# Patient Record
Sex: Female | Born: 1975 | Race: White | Hispanic: No | Marital: Single | Smoking: Current every day smoker
Health system: Southern US, Community
[De-identification: ages and names within clinical notes are randomized; demographics above are authoritative.]

## PROBLEM LIST (undated history)

## (undated) DIAGNOSIS — D510 Vitamin B12 deficiency anemia due to intrinsic factor deficiency: Secondary | ICD-10-CM

## (undated) DIAGNOSIS — D649 Anemia, unspecified: Secondary | ICD-10-CM

## (undated) DIAGNOSIS — F419 Anxiety disorder, unspecified: Secondary | ICD-10-CM

## (undated) DIAGNOSIS — N189 Chronic kidney disease, unspecified: Secondary | ICD-10-CM

## (undated) DIAGNOSIS — M1990 Unspecified osteoarthritis, unspecified site: Secondary | ICD-10-CM

## (undated) DIAGNOSIS — G43909 Migraine, unspecified, not intractable, without status migrainosus: Secondary | ICD-10-CM

## (undated) HISTORY — PX: APPENDECTOMY: SHX54

## (undated) HISTORY — DX: Anxiety disorder, unspecified: F41.9

## (undated) HISTORY — DX: Anemia, unspecified: D64.9

## (undated) HISTORY — PX: TONSILLECTOMY: SUR1361

## (undated) HISTORY — PX: ECTOPIC PREGNANCY SURGERY: SHX613

## (undated) HISTORY — DX: Migraine, unspecified, not intractable, without status migrainosus: G43.909

## (undated) HISTORY — DX: Vitamin B12 deficiency anemia due to intrinsic factor deficiency: D51.0

---

## 2012-04-10 HISTORY — PX: SHOULDER ARTHROSCOPY: SHX128

## 2013-04-10 HISTORY — PX: CERVICAL FUSION: SHX112

## 2013-12-05 ENCOUNTER — Ambulatory Visit: Payer: Self-pay | Admitting: Physician Assistant

## 2014-08-11 LAB — TSH: TSH: 0.8 u[IU]/mL (ref <=5.90)

## 2014-11-15 ENCOUNTER — Other Ambulatory Visit: Payer: Self-pay | Admitting: Internal Medicine

## 2014-11-15 ENCOUNTER — Encounter: Payer: Self-pay | Admitting: Internal Medicine

## 2014-11-15 DIAGNOSIS — Z3009 Encounter for other general counseling and advice on contraception: Secondary | ICD-10-CM

## 2014-11-15 DIAGNOSIS — K581 Irritable bowel syndrome with constipation: Secondary | ICD-10-CM | POA: Insufficient documentation

## 2014-11-15 DIAGNOSIS — D510 Vitamin B12 deficiency anemia due to intrinsic factor deficiency: Secondary | ICD-10-CM | POA: Insufficient documentation

## 2014-11-15 DIAGNOSIS — M810 Age-related osteoporosis without current pathological fracture: Secondary | ICD-10-CM | POA: Insufficient documentation

## 2014-11-15 DIAGNOSIS — E039 Hypothyroidism, unspecified: Secondary | ICD-10-CM | POA: Insufficient documentation

## 2014-11-15 HISTORY — DX: Vitamin B12 deficiency anemia due to intrinsic factor deficiency: D51.0

## 2014-11-15 HISTORY — DX: Encounter for other general counseling and advice on contraception: Z30.09

## 2014-11-20 ENCOUNTER — Encounter: Payer: Self-pay | Admitting: Family Medicine

## 2014-11-20 ENCOUNTER — Ambulatory Visit (INDEPENDENT_AMBULATORY_CARE_PROVIDER_SITE_OTHER): Payer: 59 | Admitting: Family Medicine

## 2014-11-20 VITALS — BP 100/64 | HR 80 | Ht 64.0 in | Wt 167.0 lb

## 2014-11-20 DIAGNOSIS — M7061 Trochanteric bursitis, right hip: Secondary | ICD-10-CM | POA: Diagnosis not present

## 2014-11-20 MED ORDER — ETODOLAC 500 MG PO TABS: 500.0000 mg | ORAL_TABLET | Freq: Two times a day (BID) | ORAL | Status: DC

## 2014-11-20 NOTE — Progress Notes (Signed)
Name: Theresa Murphy   MRN: [id number]    DOB: 1976/03/13   Date:11/20/2014       Progress Note  Subjective  Chief Complaint  Chief Complaint  Patient presents with  . Hip Pain    Hip Pain  There was no injury mechanism. The pain is present in the right hip. The quality of the pain is described as aching. The pain is at a severity of 8/10. The pain is moderate. The pain has been intermittent since onset. Associated symptoms include a loss of sensation and muscle weakness. Pertinent negatives include no inability to bear weight, loss of motion, numbness or tingling. The symptoms are aggravated by palpation, weight bearing and movement. She has tried non-weight bearing and ice for the symptoms. The treatment provided no relief.    No problem-specific assessment & plan notes found for this encounter.   History reviewed. No pertinent past medical history.  Past Surgical History  Procedure Laterality Date  . Cervical fusion  2015  . Shoulder arthroscopy Right 2014  . Ectopic pregnancy surgery      x 2    Family History  Problem Relation Age of Onset  . Thyroid disease Mother   . Diabetes Father   . CAD Father     Social History   Social History  . Marital Status: Single    Spouse Name: N/A  . Number of Children: N/A  . Years of Education: N/A   Occupational History  . Not on file.   Social History Main Topics  . Smoking status: Former Smoker    Quit date: 04/10/2004  . Smokeless tobacco: Not on file  . Alcohol Use: 1.2 oz/week    2 Standard drinks or equivalent per week  . Drug Use: No  . Sexual Activity: Yes   Other Topics Concern  . Not on file   Social History Narrative    Allergies  Allergen Reactions  . Codeine      Review of Systems  Constitutional: Negative for fever, chills, weight loss and malaise/fatigue.  HENT: Negative for ear discharge, ear pain and sore throat.   Eyes: Negative for blurred vision.  Respiratory: Negative for cough,  sputum production, shortness of breath and wheezing.   Cardiovascular: Negative for chest pain, palpitations and leg swelling.  Gastrointestinal: Negative for heartburn, nausea, abdominal pain, diarrhea, constipation, blood in stool and melena.  Genitourinary: Negative for dysuria, urgency, frequency and hematuria.  Musculoskeletal: Positive for myalgias. Negative for back pain, joint pain and neck pain.  Skin: Negative for rash.  Neurological: Negative for dizziness, tingling, sensory change, focal weakness, numbness and headaches.  Endo/Heme/Allergies: Negative for environmental allergies and polydipsia. Does not bruise/bleed easily.  Psychiatric/Behavioral: Negative for depression and suicidal ideas. The patient is not nervous/anxious and does not have insomnia.      Objective  Filed Vitals:   11/20/14 1543  BP: 100/64  Pulse: 80  Height:  (1.626 m)  Weight: 167 lb (75.751 kg)    Physical Exam  Constitutional: She is well-developed, well-nourished, and in no distress. No distress.  HENT:  Head: Normocephalic and atraumatic.  Right Ear: External ear normal.  Left Ear: External ear normal.  Nose: Nose normal.  Mouth/Throat: Oropharynx is clear and moist.  Eyes: Conjunctivae and EOM are normal. Pupils are equal, round, and reactive to light. Right eye exhibits no discharge. Left eye exhibits no discharge.  Neck: Normal range of motion. Neck supple. No JVD present. No thyromegaly present.  Cardiovascular: Normal rate,  regular rhythm, normal heart sounds and intact distal pulses.  Exam reveals no gallop and no friction rub.   No murmur heard. Pulmonary/Chest: Effort normal and breath sounds normal.  Abdominal: Soft. Bowel sounds are normal. She exhibits no mass. There is no tenderness. There is no guarding.  Musculoskeletal: Normal range of motion. She exhibits tenderness. She exhibits no edema.  Right trochanteric bursa  Lymphadenopathy:    She has no cervical adenopathy.   Neurological: She is alert. She has normal sensation, normal strength and normal reflexes.  Skin: Skin is warm and dry. She is not diaphoretic. No erythema.  Psychiatric: Mood and affect normal.      Assessment & Plan  Problem List Items Addressed This Visit    None    Visit Diagnoses    Trochanteric bursitis of right hip    -  Primary    Relevant Medications    etodolac (LODINE) 500 MG tablet    Other Relevant Orders    Ambulatory referral to Orthopedic Surgery         Dr. Elizabeth Sauer [facility]  11/20/2014

## 2014-11-21 ENCOUNTER — Other Ambulatory Visit: Payer: Self-pay | Admitting: Internal Medicine

## 2014-11-21 DIAGNOSIS — M7061 Trochanteric bursitis, right hip: Secondary | ICD-10-CM

## 2014-11-21 MED ORDER — ETODOLAC 500 MG PO TABS: 500.0000 mg | ORAL_TABLET | Freq: Two times a day (BID) | ORAL | Status: DC

## 2015-01-08 ENCOUNTER — Ambulatory Visit (INDEPENDENT_AMBULATORY_CARE_PROVIDER_SITE_OTHER): Payer: 59 | Admitting: Internal Medicine

## 2015-01-08 ENCOUNTER — Encounter: Payer: Self-pay | Admitting: Internal Medicine

## 2015-01-08 VITALS — BP 120/64 | HR 96 | Ht 64.0 in | Wt 154.6 lb

## 2015-01-08 DIAGNOSIS — G8918 Other acute postprocedural pain: Secondary | ICD-10-CM | POA: Diagnosis not present

## 2015-01-08 DIAGNOSIS — G43009 Migraine without aura, not intractable, without status migrainosus: Secondary | ICD-10-CM

## 2015-01-08 DIAGNOSIS — F411 Generalized anxiety disorder: Secondary | ICD-10-CM | POA: Diagnosis not present

## 2015-01-08 DIAGNOSIS — G43109 Migraine with aura, not intractable, without status migrainosus: Secondary | ICD-10-CM | POA: Insufficient documentation

## 2015-01-08 MED ORDER — FROVATRIPTAN SUCCINATE 2.5 MG PO TABS: 2.5000 mg | ORAL_TABLET | ORAL | Status: DC | PRN

## 2015-01-08 MED ORDER — CLONAZEPAM 0.5 MG PO TABS: 0.5000 mg | ORAL_TABLET | Freq: Every day | ORAL | Status: DC | PRN

## 2015-01-08 MED ORDER — DULOXETINE HCL 60 MG PO CPEP: 60.0000 mg | ORAL_CAPSULE | Freq: Every day | ORAL | Status: DC

## 2015-01-08 NOTE — Progress Notes (Signed)
Date:  01/08/2015   Name:  Theresa Murphy   DOB:  10-19-1975   MRN:  [id number]   Chief Complaint: Anxiety and Migraine Anxiety Presents for follow-up visit. Onset was 1 to 6 months ago (several times per week). Symptoms include chest pain, nausea, nervous/anxious behavior, palpitations and panic. Patient reports no decreased concentration, depressed mood, dizziness, irritability, shortness of breath or suicidal ideas. Symptoms occur occasionally. The most recent episode lasted 2 hours. The severity of symptoms is moderate.   Risk factors include recent illness (Anxiety and panic attacks are worse since her neck surgery. ). Past treatments include SSRIs (She's been on a number of SSRIs in the past.).  Migraine  This is a recurrent problem. The current episode started more than 1 year ago. The problem occurs intermittently (twice a month). The pain is located in the right unilateral, frontal and retro-orbital region. The pain quality is similar to prior headaches. Associated symptoms include nausea, neck pain, phonophobia and photophobia. Pertinent negatives include no dizziness, fever or seizures.   The patient reports a history of migraines starting in the teenager. They got better for a number of years. However since her cervical fusion last year they've been increasing. She has a migraine about twice per month. Currently taking only ibuprofen or aspirin. Previously she took Frova with good results.  She's been having more chronic neck pain since her fusion. Tabetic surgery cannot find anything to help her so she's been referred to the pain clinic. She's not had her first appointment yet that next month.  Review of Systems:  Review of Systems  Constitutional: Negative for fever, irritability and fatigue.  Eyes: Positive for photophobia.  Respiratory: Negative for choking and shortness of breath.   Cardiovascular: Positive for chest pain and palpitations.  Gastrointestinal: Positive for  nausea.  Musculoskeletal: Positive for neck pain and neck stiffness.  Neurological: Positive for light-headedness and headaches. Negative for dizziness, seizures and syncope.  Psychiatric/Behavioral: Negative for suicidal ideas, dysphoric mood and decreased concentration. The patient is nervous/anxious.     Patient Active Problem List   Diagnosis Date Noted  . Acquired hypothyroidism 11/15/2014  . Irritable bowel syndrome with constipation 11/15/2014  . OP (osteoporosis) 11/15/2014  . 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 Family planning 11/15/2014    Prior to Admission medications   Medication Sig Start Date End Date Taking? Authorizing Provider  cyanocobalamin (,VITAMIN B-12,) 1000 MCG/ML injection CYANOCOBALAMIN, 1000MCG/ML (Injection Solution)  1 (one) Milliliter monthly for 300 days  Quantity: 10;  Refills: 0   Ordered :11-Aug-2014  Bari Edward M.D.;  Started 11-Aug-2014 Active 08/11/14  Yes Historical Provider, MD  Linaclotide Karlene Einstein) 145 MCG CAPS capsule Take 1 capsule by mouth daily. 09/02/14  Yes Historical Provider, MD  norethindrone-ethinyl estradiol (MICROGESTIN FE 1/20) 1-20 MG-MCG tablet Take 1 tablet by mouth daily. 08/19/14  Yes Historical Provider, MD    Allergies  Allergen Reactions  . Codeine     Past Surgical History  Procedure Laterality Date  . Cervical fusion  2015  . Shoulder arthroscopy Right 2014  . Ectopic pregnancy surgery      x 2    Social History  Substance Use Topics  . Smoking status: Former Smoker    Quit date: 04/10/2004  . Smokeless tobacco: None  . Alcohol Use: 1.2 oz/week    2 Standard drinks or equivalent per week     Medication list has been reviewed and updated.  Physical Examination:  Physical Exam  Constitutional: She is oriented to person,  place, and time. She appears well-developed and well-nourished. No distress.  HENT:  Head: Normocephalic and atraumatic.  Eyes: Conjunctivae and EOM are normal. Pupils are equal, round,  and reactive to light. Right eye exhibits no discharge. Left eye exhibits no discharge. No scleral icterus.  Neck: Muscular tenderness present. No rigidity.    Cardiovascular: Normal rate, regular rhythm and normal heart sounds.   Pulmonary/Chest: Effort normal and breath sounds normal. No respiratory distress.  Musculoskeletal: Normal range of motion.  Lymphadenopathy:    She has no cervical adenopathy.  Neurological: She is alert and oriented to person, place, and time. She has normal strength.  Skin: Skin is warm and dry. No rash noted.  Psychiatric: Her speech is normal and behavior is normal. Thought content normal. Her mood appears anxious.    BP 120/64 mmHg  Pulse 96  Ht  (1.626 m)  Wt 154 lb 9.6 oz (70.126 kg)  BMI 26.52 kg/m2  Assessment and Plan: 1. Generalized anxiety disorder Begin Cymbalta for anxiety as well as pain control Patient may take clonazepam when necessary for panic attack - DULoxetine (CYMBALTA) 60 MG capsule; Take 1 capsule (60 mg total) by mouth daily.  Dispense: 30 capsule; Refill: 3 - clonazePAM (KLONOPIN) 0.5 MG tablet; Take 1 tablet (0.5 mg total) by mouth daily as needed for anxiety.  Dispense: 30 tablet; Refill: 0  2. Migraine headache Begin Frova as needed - frovatriptan (FROVA) 2.5 MG tablet; Take 1 tablet (2.5 mg total) by mouth as needed for migraine. If recurs, may repeat after 2 hours. Max of 3 tabs in 24 hours.  Dispense: 10 tablet; Refill: 5  3. Worsening of neck pain following surgery May get some benefit from duloxetine Follow-up with pain clinic as planned  Bari Edward, MD [facility]  01/08/2015

## 2015-01-25 ENCOUNTER — Emergency Department: Payer: 59

## 2015-01-25 ENCOUNTER — Emergency Department
Admission: EM | Admit: 2015-01-25 | Discharge: 2015-01-25 | Disposition: A | Discharge status: Home / Self Care | Payer: 59 | Attending: Emergency Medicine | Admitting: Emergency Medicine

## 2015-01-25 ENCOUNTER — Encounter: Payer: Self-pay | Admitting: Emergency Medicine

## 2015-01-25 DIAGNOSIS — Z87891 Personal history of nicotine dependence: Secondary | ICD-10-CM | POA: Diagnosis not present

## 2015-01-25 DIAGNOSIS — W1839XA Other fall on same level, initial encounter: Secondary | ICD-10-CM | POA: Diagnosis not present

## 2015-01-25 DIAGNOSIS — M5412 Radiculopathy, cervical region: Secondary | ICD-10-CM | POA: Diagnosis not present

## 2015-01-25 DIAGNOSIS — S299XXA Unspecified injury of thorax, initial encounter: Secondary | ICD-10-CM | POA: Diagnosis present

## 2015-01-25 DIAGNOSIS — Y9389 Activity, other specified: Secondary | ICD-10-CM | POA: Diagnosis not present

## 2015-01-25 DIAGNOSIS — Y9289 Other specified places as the place of occurrence of the external cause: Secondary | ICD-10-CM | POA: Diagnosis not present

## 2015-01-25 DIAGNOSIS — Z79899 Other long term (current) drug therapy: Secondary | ICD-10-CM | POA: Diagnosis not present

## 2015-01-25 DIAGNOSIS — Y998 Other external cause status: Secondary | ICD-10-CM | POA: Diagnosis not present

## 2015-01-25 MED ORDER — KETOROLAC TROMETHAMINE 30 MG/ML IJ SOLN
60.0000 mg | Freq: Once | INTRAMUSCULAR | Status: AC
  Administered 2015-01-25: 60 mg via INTRAMUSCULAR
  Filled 2015-01-25: qty 2

## 2015-01-25 MED ORDER — OXYCODONE-ACETAMINOPHEN 5-325 MG PO TABS
1.0000 | ORAL_TABLET | Freq: Once | ORAL | Status: AC
  Administered 2015-01-25: 1 via ORAL
  Filled 2015-01-25: qty 1

## 2015-01-25 MED ORDER — MELOXICAM 15 MG PO TABS: 15.0000 mg | ORAL_TABLET | Freq: Every day | ORAL | Status: DC

## 2015-01-25 MED ORDER — OXYCODONE-ACETAMINOPHEN 5-325 MG PO TABS: 1.0000 | ORAL_TABLET | Freq: Four times a day (QID) | ORAL | Status: DC | PRN

## 2015-01-25 NOTE — ED Provider Notes (Signed)
[facility] Emergency Department Provider Note  ____________________________________________  Time seen: Approximately 7:45 PM  I have reviewed the triage vital signs and the nursing notes.   HISTORY  Chief Complaint Back Pain    HPI Theresa Murphy is a 39 y.o. female who presents to emergency department complaining of neck and back pain status post fall. She states that she fell 4 days prior and the pain has been also bearable since then. She states that she has a history of thoracic spine fractures as well as a cervical spine fusion. She hurts midline and that occasionally pain will shoot to both shoulders. The pain is severe baseline and almost unbearable with extreme movement. He said the pain that radiates to her shoulders is a "fiery" sensation. She used over-the-counter medications with no relief. Seen in urgent care and x-rays were performed however due to the poor quality they were unable to make any diagnosis or clearance of fracture. She denies any numbness or tingling to lower extremities. She denies any numbness or tingling in her hands.   History reviewed. No pertinent past medical history.  Patient Active Problem List   Diagnosis Date Noted  . Generalized anxiety disorder 01/08/2015  . 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 Worsening of neck pain following surgery 01/08/2015  . Acquired hypothyroidism 11/15/2014  . Irritable bowel syndrome with constipation 11/15/2014  . OP (osteoporosis) 11/15/2014  . 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 Family planning 11/15/2014    Past Surgical History  Procedure Laterality Date  . Cervical fusion  2015  . Shoulder arthroscopy Right 2014  . Ectopic pregnancy surgery      x 2    Current Outpatient Rx  Name  Route  Sig  Dispense  Refill  . clonazePAM (KLONOPIN) 0.5 MG tablet   Oral   Take 1 tablet (0.5 mg total) by mouth daily as needed for anxiety.   30 tablet   0   . cyanocobalamin (,VITAMIN  B-12,) 1000 MCG/ML injection      CYANOCOBALAMIN, 1000MCG/ML (Injection Solution)  1 (one) Milliliter monthly for 300 days  Quantity: 10;  Refills: 0   Ordered :11-Aug-2014  Bari Edward M.D.;  Started 11-Aug-2014 Active         . DULoxetine (CYMBALTA) 60 MG capsule   Oral   Take 1 capsule (60 mg total) by mouth daily.   30 capsule   3   . frovatriptan (FROVA) 2.5 MG tablet   Oral   Take 1 tablet (2.5 mg total) by mouth as needed for migraine. If recurs, may repeat after 2 hours. Max of 3 tabs in 24 hours.   10 tablet   5   . Linaclotide (LINZESS) 145 MCG CAPS capsule   Oral   Take 1 capsule by mouth daily.         . norethindrone-ethinyl estradiol (MICROGESTIN FE 1/20) 1-20 MG-MCG tablet   Oral   Take 1 tablet by mouth daily.           Allergies Codeine  Family History  Problem Relation Age of Onset  . Thyroid disease Mother   . Diabetes Father   . CAD Father     Social History Social History  Substance Use Topics  . Smoking status: Former Smoker    Quit date: 04/10/2004  . Smokeless tobacco: None  . Alcohol Use: 1.2 oz/week    2 Standard drinks or equivalent per week    Review of Systems Constitutional: No fever/chills Eyes: No visual  changes. ENT: No sore throat. Cardiovascular: Denies chest pain. Respiratory: Denies shortness of breath. Gastrointestinal: No abdominal pain.  No nausea, no vomiting.  No diarrhea.  No constipation. Genitourinary: Negative for dysuria. Musculoskeletal: Endorses back and neck pain. Skin: Negative for rash. Neurological: Negative for headaches, focal weakness or numbness.  10-point ROS otherwise negative.  ____________________________________________   PHYSICAL EXAM:  VITAL SIGNS: ED Triage Vitals  Enc Vitals Group     BP 01/25/15 1839 126/57 mmHg     Pulse Rate 01/25/15 1839 91     Resp 01/25/15 1839 18     Temp 01/25/15 1839 97 F (36.1 C)     Temp Source 01/25/15 1839 Oral     SpO2 01/25/15 1839  100 %     Weight 01/25/15 1839 152 lb (68.947 kg)     Height 01/25/15 1839 5\' 4"  (1.626 m)     Head Cir --      Peak Flow --      Pain Score 01/25/15 1840 10     Pain Loc --      Pain Edu? --      Excl. in GC? --     Constitutional: Alert and oriented. Well appearing and in no acute distress. Eyes: Conjunctivae are normal. PERRL. EOMI. Head: Atraumatic. Nose: No congestion/rhinnorhea. Mouth/Throat: Mucous membranes are moist.  Oropharynx non-erythematous. Neck: No stridor.  Tenderness to palpation over C5-C7 spinal processes. No tenderness to palpation over paraspinal muscles. Cardiovascular: Normal rate, regular rhythm. Grossly normal heart sounds.  Good peripheral circulation. Respiratory: Normal respiratory effort.  No retractions. Lungs CTAB. Gastrointestinal: Soft and nontender. No distention. No abdominal bruits. No CVA tenderness. Musculoskeletal: No lower extremity tenderness nor edema.  No joint effusions. Tenderness to palpation or spinal processes in the T1-T4 range. No deformities palpated. No tenderness to palpation over paraspinal muscles. No muscle spasms noted. Neurologic:  Normal speech and language. No gross focal neurologic deficits are appreciated. No gait instability. Skin:  Skin is warm, dry and intact. No rash noted. Psychiatric: Mood and affect are normal. Speech and behavior are normal.  ____________________________________________   LABS (all labs ordered are listed, but only abnormal results are displayed)  Labs Reviewed - No data to display ____________________________________________  EKG   ____________________________________________  RADIOLOGY  CT C-spine Impression: No acute traumatic injury within the cervical spine. Status post ACDF at C4-C6 without, location. __________________________   PROCEDURES  Procedure(s) performed: None  Critical Care performed: No  ____________________________________________   INITIAL IMPRESSION /  ASSESSMENT AND PLAN / ED COURSE  Pertinent labs & imaging results that were available during my care of the patient were reviewed by me and considered in my medical decision making (see chart for details).  The patient's history, symptoms, and physical exam are consistent with radiculopathy to the cervical region status post fall. Patient imaging reveals no acute fractures. I advised the patient of findings and diagnosis. The patient verbalizes understanding of diagnosis. I advised the patient to place her on anti-inflammatories for additional symptomatic relief. Patient is to follow-up with orthopedics should symptoms persist past treatment course. ____________________________________________   FINAL CLINICAL IMPRESSION(S) / ED DIAGNOSES  Final diagnoses:  Cervical radiculopathy      Racheal PatchesJonathan D Lisia Westbay, PA-C 01/25/15 2114  Myrna Blazeravid Matthew Schaevitz, MD 01/25/15 [phone]

## 2015-01-25 NOTE — ED Notes (Addendum)
Fell last Thursday.  Has history of back fractures in the past.  Since fall last Thursday, c/o severe pain mid scapular.  Has been taking Advil, no relief of pain. c/o shortness of breath from the pain since Friday.

## 2015-01-25 NOTE — Discharge Instructions (Signed)

## 2015-02-02 ENCOUNTER — Ambulatory Visit (INDEPENDENT_AMBULATORY_CARE_PROVIDER_SITE_OTHER): Payer: 59 | Admitting: Internal Medicine

## 2015-02-02 ENCOUNTER — Other Ambulatory Visit: Payer: Self-pay | Admitting: Internal Medicine

## 2015-02-02 ENCOUNTER — Encounter: Payer: Self-pay | Admitting: Internal Medicine

## 2015-02-02 VITALS — BP 100/58 | HR 84 | Ht 64.0 in | Wt 160.2 lb

## 2015-02-02 DIAGNOSIS — Z1239 Encounter for other screening for malignant neoplasm of breast: Secondary | ICD-10-CM

## 2015-02-02 DIAGNOSIS — Z0000 Encounter for general adult medical examination without abnormal findings: Secondary | ICD-10-CM

## 2015-02-02 DIAGNOSIS — E039 Hypothyroidism, unspecified: Secondary | ICD-10-CM

## 2015-02-02 DIAGNOSIS — K581 Irritable bowel syndrome with constipation: Secondary | ICD-10-CM | POA: Diagnosis not present

## 2015-02-02 DIAGNOSIS — G43109 Migraine with aura, not intractable, without status migrainosus: Secondary | ICD-10-CM

## 2015-02-02 DIAGNOSIS — Z124 Encounter for screening for malignant neoplasm of cervix: Secondary | ICD-10-CM | POA: Diagnosis not present

## 2015-02-02 DIAGNOSIS — G8918 Other acute postprocedural pain: Secondary | ICD-10-CM | POA: Diagnosis not present

## 2015-02-02 LAB — POC URINALYSIS WITH MICROSCOPIC (NON AUTO)MANUAL RESULT
BILIRUBIN UA: NEGATIVE
CRYSTALS: 0
EPITHELIAL CELLS, URINE PER MICROSCOPY: 10
GLUCOSE UA: NEGATIVE
Ketones, UA: NEGATIVE
Mucus, UA: 0
Nitrite, UA: NEGATIVE
Protein, UA: NEGATIVE
RBC: 1 M/uL — AB (ref 4.04–5.48)
Spec Grav, UA: 1.02
UROBILINOGEN UA: 0.2
WBC Casts, UA: 2
pH, UA: 5

## 2015-02-02 NOTE — Patient Instructions (Signed)
Breast Self-Awareness Practicing breast self-awareness may pick up problems early, prevent significant medical complications, and possibly save your life. By practicing breast self-awareness, you can become familiar with how your breasts look and feel and if your breasts are changing. This allows you to notice changes early. It can also offer you some reassurance that your breast health is good. One way to learn what is normal for your breasts and whether your breasts are changing is to do a breast self-exam. If you find a lump or something that was not present in the past, it is best to contact your caregiver right away. Other findings that should be evaluated by your caregiver include nipple discharge, especially if it is bloody; skin changes or reddening; areas where the skin seems to be pulled in (retracted); or new lumps and bumps. Breast pain is seldom associated with cancer (malignancy), but should also be evaluated by a caregiver. HOW TO PERFORM A BREAST SELF-EXAM The best time to examine your breasts is 5-7 days after your menstrual period is over. During menstruation, the breasts are lumpier, and it may be more difficult to pick up changes. If you do not menstruate, have reached menopause, or had your uterus removed (hysterectomy), you should examine your breasts at regular intervals, such as monthly. If you are breastfeeding, examine your breasts after a feeding or after using a breast pump. Breast implants do not decrease the risk for lumps or tumors, so continue to perform breast self-exams as recommended. Talk to your caregiver about how to determine the difference between the implant and breast tissue. Also, talk about the amount of pressure you should use during the exam. Over time, you will become more familiar with the variations of your breasts and more comfortable with the exam. A breast self-exam requires you to remove all your clothes above the waist. 1. Look at your breasts and nipples.  Stand in front of a mirror in a room with good lighting. With your hands on your hips, push your hands firmly downward. Look for a difference in shape, contour, and size from one breast to the other (asymmetry). Asymmetry includes puckers, dips, or bumps. Also, look for skin changes, such as reddened or scaly areas on the breasts. Look for nipple changes, such as discharge, dimpling, repositioning, or redness. 2. Carefully feel your breasts. This is best done either in the shower or tub while using soapy water or when flat on your back. Place the arm (on the side of the breast you are examining) above your head. Use the pads (not the fingertips) of your three middle fingers on your opposite hand to feel your breasts. Start in the underarm area and use  inch (2 cm) overlapping circles to feel your breast. Use 3 different levels of pressure (light, medium, and firm pressure) at each circle before moving to the next circle. The light pressure is needed to feel the tissue closest to the skin. The medium pressure will help to feel breast tissue a little deeper, while the firm pressure is needed to feel the tissue close to the ribs. Continue the overlapping circles, moving downward over the breast until you feel your ribs below your breast. Then, move one finger-width towards the center of the body. Continue to use the  inch (2 cm) overlapping circles to feel your breast as you move slowly up toward the collar bone (clavicle) near the base of the neck. Continue the up and down exam using all 3 pressures until you reach the   middle of the chest. Do this with each breast, carefully feeling for lumps or changes. 3.  Keep a written record with breast changes or normal findings for each breast. By writing this information down, you do not need to depend only on memory for size, tenderness, or location. Write down where you are in your menstrual cycle, if you are still menstruating. Breast tissue can have some lumps or  thick tissue. However, see your caregiver if you find anything that concerns you.  SEEK MEDICAL CARE IF:  You see a change in shape, contour, or size of your breasts or nipples.   You see skin changes, such as reddened or scaly areas on the breasts or nipples.   You have an unusual discharge from your nipples.   You feel a new lump or unusually thick areas.    This information is not intended to replace advice given to you by your health care provider. Make sure you discuss any questions you have with your health care provider.   Document Released: 03/27/2005 Document Revised: 03/13/2012 Document Reviewed: 07/12/2011 Elsevier Interactive Patient Education 2016 [employer]  

## 2015-02-02 NOTE — Progress Notes (Signed)
Date:  02/02/2015   Name:  Theresa Murphy   DOB:  08-06-1975   MRN:  [id number]   Chief Complaint: Annual Exam Theresa Murphy is a 39 y.o. female who presents today for her Complete Annual Exam. She feels fairly well. She reports exercising walking and using elliptical. She reports she is sleeping poorly. No breast symptoms and no hx of mammogram.  Menses are regular on OCP.  Last Pap was normal 2 years ago. Neck pain - Patient has chronic neck pain from a previous cervical fusion. She is waiting on a pain clinic appointment that will be upcoming in the next week. Several weeks ago she fell jarring her neck and back and was seen in the emergency room. CT scan was done which showed intact hardware no fracture. She was prescribed some Percocet and meloxicam 15 mg. She says it's only helping her pain minimally.  Migraine headache -patient has intermittent migraine headaches. Last visit she was given Cymbalta that might help with both headache and chronic pain but she was unable to tolerate that. It made her extremely nauseated.  Review of Systems  Constitutional: Positive for fatigue. Negative for fever and unexpected weight change.  HENT: Negative for hearing loss.   Eyes: Negative for visual disturbance.  Respiratory: Negative for cough, chest tightness and shortness of breath.   Cardiovascular: Negative for chest pain, palpitations and leg swelling.  Gastrointestinal: Negative for nausea and diarrhea.  Genitourinary: Negative for dysuria, hematuria, vaginal discharge (no concern regarding STD), menstrual problem and pelvic pain.  Musculoskeletal: Positive for back pain, neck pain and neck stiffness.  Skin: Negative for color change and rash.  Neurological: Positive for headaches. Negative for seizures, light-headedness and numbness.  Hematological: Negative for adenopathy. Does not bruise/bleed easily.  Psychiatric/Behavioral: Positive for sleep disturbance and dysphoric mood. Negative for  agitation.    Patient Active Problem List   Diagnosis Date Noted  . Generalized anxiety disorder 01/08/2015  . 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 Worsening of neck pain following surgery 01/08/2015  . Acquired hypothyroidism 11/15/2014  . Irritable bowel syndrome with constipation 11/15/2014  . OP (osteoporosis) 11/15/2014  . 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 Family planning 11/15/2014    Prior to Admission medications   Medication Sig Start Date End Date Taking? Authorizing Provider  clonazePAM (KLONOPIN) 0.5 MG tablet Take 1 tablet (0.5 mg total) by mouth daily as needed for anxiety. 01/08/15  Yes Reubin Milan, MD  cyanocobalamin (,VITAMIN B-12,) 1000 MCG/ML injection CYANOCOBALAMIN, 1000MCG/ML (Injection Solution)  1 (one) Milliliter monthly for 300 days  Quantity: 10;  Refills: 0   Ordered :11-Aug-2014  Bari Edward M.D.;  Started 11-Aug-2014 Active 08/11/14  Yes Historical Provider, MD  frovatriptan (FROVA) 2.5 MG tablet Take 1 tablet (2.5 mg total) by mouth as needed for migraine. If recurs, may repeat after 2 hours. Max of 3 tabs in 24 hours. 01/08/15  Yes Reubin Milan, MD  gabapentin (NEURONTIN) 100 MG capsule Take 3 capsules by mouth 3 (three) times daily. 01/08/15  Yes Historical Provider, MD  Linaclotide Karlene Einstein) 145 MCG CAPS capsule Take 1 capsule by mouth daily. 09/02/14  Yes Historical Provider, MD  meloxicam (MOBIC) 15 MG tablet Take 1 tablet (15 mg total) by mouth daily. 01/25/15  Yes Delorise Royals Cuthriell, PA-C  norethindrone-ethinyl estradiol (MICROGESTIN FE 1/20) 1-20 MG-MCG tablet Take 1 tablet by mouth daily. 08/19/14  Yes Historical Provider, MD  DULoxetine (CYMBALTA) 60 MG capsule Take 1 capsule (60 mg total) by mouth daily.  Patient not taking: Reported on 02/02/2015 01/08/15   Reubin MilanLaura H Estrella Alcaraz, MD    Allergies  Allergen Reactions  . Codeine   . Cymbalta [Duloxetine Hcl] Nausea Only    Past Surgical History  Procedure Laterality Date  . Cervical  fusion  2015  . Shoulder arthroscopy Right 2014  . Ectopic pregnancy surgery      x 2    Social History  Substance Use Topics  . Smoking status: Former Smoker    Quit date: 04/10/2004  . Smokeless tobacco: None  . Alcohol Use: 1.2 oz/week    2 Standard drinks or equivalent per week     Medication list has been reviewed and updated.   Physical Exam  Constitutional: She is oriented to person, place, and time. She appears well-developed and well-nourished. No distress.  HENT:  Head: Normocephalic and atraumatic.  Right Ear: Tympanic membrane and ear canal normal.  Left Ear: Tympanic membrane and ear canal normal.  Nose: Right sinus exhibits no maxillary sinus tenderness. Left sinus exhibits no maxillary sinus tenderness.  Mouth/Throat: Uvula is midline and oropharynx is clear and moist.  Eyes: Conjunctivae and EOM are normal. Pupils are equal, round, and reactive to light. Right eye exhibits no discharge. Left eye exhibits no discharge. No scleral icterus.  Neck: Muscular tenderness present. Carotid bruit is not present. Decreased range of motion present. No erythema present. No thyromegaly present.  Cardiovascular: Normal rate, regular rhythm, normal heart sounds, intact distal pulses and normal pulses.   Pulmonary/Chest: Effort normal and breath sounds normal. No respiratory distress. She has no wheezes. Right breast exhibits no mass, no nipple discharge, no skin change and no tenderness. Left breast exhibits no mass, no nipple discharge, no skin change and no tenderness.  Abdominal: Soft. Bowel sounds are normal. There is no hepatosplenomegaly. There is no tenderness. There is no CVA tenderness.  Genitourinary: Vagina normal and uterus normal. Pelvic exam was performed with patient prone. There is no rash or lesion on the right labia. There is no rash or lesion on the left labia. Cervix exhibits discharge (scant yellow mucus). Cervix exhibits no motion tenderness and no friability.  Right adnexum displays no mass, no tenderness and no fullness. Left adnexum displays no mass, no tenderness and no fullness.  Musculoskeletal:       Cervical back: She exhibits decreased range of motion and tenderness.  Lymphadenopathy:    She has no cervical adenopathy.    She has no axillary adenopathy.  Neurological: She is alert and oriented to person, place, and time. She has normal reflexes. No cranial nerve deficit or sensory deficit.  Skin: Skin is warm, dry and intact. No rash noted.  Psychiatric: She has a normal mood and affect. Her speech is normal and behavior is normal. Thought content normal.  Nursing note and vitals reviewed.   BP 100/58 mmHg  Pulse 84  Ht 5\' 4"  (1.626 m)  Wt 160 lb 3.2 oz (72.666 kg)  BMI 27.48 kg/m2  LMP 01/14/2015  Assessment and Plan: 1. Annual physical exam Urinalysis appears contaminated by vaginal discharge - POC urinalysis w microscopic (non auto)  2. Pap smear for cervical cancer screening - Pap IG and HPV (high risk) DNA detection  3. Acquired hypothyroidism recent TSH normal - not requiring supplementation  4. Migraine aura without headache Intermittent symptoms - continue Frova as needed  5. Irritable bowel syndrome with constipation Doing well on lens S - Comprehensive metabolic panel - CBC with Differential/Platelet  6. Breast cancer  screening Begin annual screening - MM DIGITAL SCREENING BILATERAL; Future  7. Worsening of neck pain following surgery Continue meloxicam, as needed oxycodone Appointment with pain clinic for further management  Bari Edward, MD [facility]  02/02/2015

## 2015-02-03 LAB — CBC WITH DIFFERENTIAL/PLATELET
Basophils Absolute: 0 10*3/uL (ref 0.0–0.2)
Basos: 0 %
EOS (ABSOLUTE): 0.1 10*3/uL (ref 0.0–0.4)
EOS: 1 %
HEMATOCRIT: 38.1 % (ref 34.0–46.6)
HEMOGLOBIN: 12.5 g/dL (ref 11.1–15.9)
IMMATURE GRANS (ABS): 0.1 10*3/uL (ref 0.0–0.1)
Immature Granulocytes: 1 %
LYMPHS ABS: 1.8 10*3/uL (ref 0.7–3.1)
LYMPHS: 21 %
MCH: 31.9 pg (ref 26.6–33.0)
MCHC: 32.8 g/dL (ref 31.5–35.7)
MCV: 97 fL (ref 79–97)
MONOCYTES: 7 %
Monocytes Absolute: 0.6 10*3/uL (ref 0.1–0.9)
Neutrophils Absolute: 6 10*3/uL (ref 1.4–7.0)
Neutrophils: 70 %
Platelets: 403 10*3/uL — ABNORMAL HIGH (ref 150–379)
RBC: 3.92 x10E6/uL (ref 3.77–5.28)
RDW: 13.6 % (ref 12.3–15.4)
WBC: 8.4 10*3/uL (ref 3.4–10.8)

## 2015-02-03 LAB — COMPREHENSIVE METABOLIC PANEL
ALBUMIN: 4 g/dL (ref 3.5–5.5)
ALK PHOS: 63 IU/L (ref 39–117)
ALT: 13 IU/L (ref 0–32)
AST: 12 IU/L (ref 0–40)
Albumin/Globulin Ratio: 1.7 (ref 1.1–2.5)
BUN / CREAT RATIO: 17 (ref 9–23)
BUN: 13 mg/dL (ref 6–24)
Bilirubin Total: 0.3 mg/dL (ref 0.0–1.2)
CO2: 22 mmol/L (ref 18–29)
CREATININE: 0.75 mg/dL (ref 0.57–1.00)
Calcium: 8.8 mg/dL (ref 8.7–10.2)
Chloride: 103 mmol/L (ref 97–106)
GFR calc non Af Amer: 100 mL/min/{1.73_m2} (ref >59)
GFR, EST AFRICAN AMERICAN: 115 mL/min/{1.73_m2} (ref >59)
GLUCOSE: 89 mg/dL (ref 65–99)
Globulin, Total: 2.4 g/dL (ref 1.5–4.5)
Potassium: 4.4 mmol/L (ref 3.5–5.2)
Sodium: 140 mmol/L (ref 136–144)
TOTAL PROTEIN: 6.4 g/dL (ref 6.0–8.5)

## 2015-02-04 LAB — PAP IG AND HPV HIGH-RISK: PAP Smear Comment: 0

## 2015-02-09 ENCOUNTER — Ambulatory Visit: Payer: 59 | Attending: Pain Medicine | Admitting: Pain Medicine

## 2015-02-09 ENCOUNTER — Telehealth: Payer: Self-pay

## 2015-02-09 ENCOUNTER — Encounter: Payer: Self-pay | Admitting: Pain Medicine

## 2015-02-09 VITALS — BP 107/58 | HR 89 | Temp 98.6°F | Resp 16 | Ht 64.0 in | Wt 150.0 lb

## 2015-02-09 DIAGNOSIS — Z981 Arthrodesis status: Secondary | ICD-10-CM | POA: Diagnosis not present

## 2015-02-09 DIAGNOSIS — M533 Sacrococcygeal disorders, not elsewhere classified: Secondary | ICD-10-CM | POA: Insufficient documentation

## 2015-02-09 DIAGNOSIS — M7060 Trochanteric bursitis, unspecified hip: Secondary | ICD-10-CM | POA: Diagnosis not present

## 2015-02-09 DIAGNOSIS — Q761 Klippel-Feil syndrome: Secondary | ICD-10-CM | POA: Insufficient documentation

## 2015-02-09 DIAGNOSIS — M79602 Pain in left arm: Secondary | ICD-10-CM | POA: Diagnosis present

## 2015-02-09 DIAGNOSIS — K589 Irritable bowel syndrome without diarrhea: Secondary | ICD-10-CM | POA: Diagnosis not present

## 2015-02-09 DIAGNOSIS — F418 Other specified anxiety disorders: Secondary | ICD-10-CM | POA: Diagnosis not present

## 2015-02-09 DIAGNOSIS — M5481 Occipital neuralgia: Secondary | ICD-10-CM | POA: Insufficient documentation

## 2015-02-09 DIAGNOSIS — M5030 Other cervical disc degeneration, unspecified cervical region: Secondary | ICD-10-CM | POA: Insufficient documentation

## 2015-02-09 DIAGNOSIS — M79601 Pain in right arm: Secondary | ICD-10-CM | POA: Diagnosis present

## 2015-02-09 DIAGNOSIS — M47812 Spondylosis without myelopathy or radiculopathy, cervical region: Secondary | ICD-10-CM | POA: Insufficient documentation

## 2015-02-09 DIAGNOSIS — M5134 Other intervertebral disc degeneration, thoracic region: Secondary | ICD-10-CM | POA: Insufficient documentation

## 2015-02-09 DIAGNOSIS — M542 Cervicalgia: Secondary | ICD-10-CM | POA: Diagnosis present

## 2015-02-09 DIAGNOSIS — M5136 Other intervertebral disc degeneration, lumbar region: Secondary | ICD-10-CM | POA: Insufficient documentation

## 2015-02-09 NOTE — Progress Notes (Signed)
Subjective:    Patient ID: Theresa Murphy, female    DOB: [birth year], 39 y.o.   MRN: [id number]  HPI Patient is 39 year old female who comes to pain management Center questionnaire Dr.Demmig Maisie Fushomas for further evaluation and treatment of pain involving the neck upper extremity region and headaches. The patient states that she is status post surgical intervention of the cervical region and states that she has had pain since her surgery of prostate 1 year ago. The patient states that she fell approximately 3 weeks ago when she tripped while getting out of the bed. Patient states the pain involves the upper back region and headaches in the upper extremity region on the right. Patient described the pain as aching unknowing constant cramping deep disabling nagging pressure like sensation. Patient stated the pain was sharp shooting stabbing tingling tiring uncomfortable. Patient stated that the pain interferes with ability to go to sleep and prevents her from going to sleep quite often. Patient admits to some weakness of the upper extremity as well as spasms and states that the pain is aggravated by twisting and sitting standing squatting stooping twisting and the surgery made the pain worse. Patient states the pain has decreased with cold packs hot packs lying down resting and medications. We discussed patient's condition and will consider patient for interventional treatment at time return appointment consisting of greater occipital nerve block and myoneural block injections. We informed patient that we will consider intraspinal injections and other treatment modifications pending response to the greater occipital nerve block and myoneural block injections. The patient admits to significant headache as well as significant muscle spasms of the cervical and thoracic region. We will proceed with what is felt to be medically necessary procedure in attempt to decrease severity of symptoms, minimize progression of  symptoms, and avoid the need for more involved treatment. The patient was with understanding and in agreement with suggested treatment plan.      Review of Systems   Cardiovascular Unremarkable  Pulmonary Unremarkable  Neurological Unremarkable  Psychological Anxiety Depression Panic attacks  Gastrointestinal Irritable bowel syndrome  Genitourinary Kidney stones Blood and urine Recurrent urinary tract infection  Hematological Unremarkable  Endocrine  Unremarkable  Musculoskeletal Unremarkable  Other significant Unremarkable         ObjecRheumatotive:   Physical Exam     There was moderate to moderately severe tenderness to palpation of the splenius capitis and occipitalis musculature region. Patient appeared to be with unremarkable Spurling's maneuver. Palpation of the acromioclavicular and glenohumeral joint region was a tends to palpation of moderate degree Patient appeared to be with decreased grip strength. Tinel and Phalen's maneuver were without increased pain of significant degree. There was tenderness over the cervical facet cervical paraspinal musculature region of moderate to moderately severe degree and tenderness over the thoracic facet thoracic paraspinal musculature region as well. No crepitus of the thoracic region was noted. Palpation over the levator scapula rhomboid musculature regions were revealed moderate to moderately severe tenderness to palpation and muscle spasms. Palpation over the lumbar paraspinal musculature region lumbar facet region was a tends to palpation of mild to moderate degree. Lateral bending and rotation extension and palpation of the lumbar facets reproduce mild to moderate discomfort. Palpation over the PSIS and PII S region reproduced moderate discomfort. There was mild tenderness of the greater trochanteric region and iliotibial band region. No definite sensory deficit of dermatomal distribution of the lower extremities  noted. Abdomen was nontender with no costovertebral angle tenderness noted  Assessment & Plan:   Bilateral occipital neuralgia   Degenerative disc disease of the cervical spine  Status post cervical fusion   Cervical facet syndrome   Sacroiliac joint dysfunction   Greater trochanteric bursitis      PLAN   Continue present medication Mobidin Klonopin and oxycodone acetaminophen. May consider Zanaflex and baclofen Cymbalta. We also discussed patient's use of Neurontin on today's visit  Greater occipital nerve block to be performed at time return appointment  F/U PCP Dr.Berglund  for evaliation of  BP and general medical  condition  F/U surgical evaluation. May consider pending follow-up evaluationsPatient is status post surgery of the cervical region by Dr. Rayetta Humphrey of [facility]  F/U neurological evaluation. May consider pending follow-up evaluations  May consider radiofrequency rhizolysis or intraspinal procedures pending response to present treatment and F/U evaluation   Patient to call Pain Management Center should patient have concerns prior to scheduled return appointment.

## 2015-02-09 NOTE — Patient Instructions (Addendum)
PLAN   PLAN   Continue present medications  Greater occipital nerve block to be performed at time return appointment  F/U PCP for evaliation of  BP and general medical  condition  F/U surgical evaluation. May consider pending follow-up evaluations  F/U neurological evaluation. May consider pending follow-up evaluations  May consider radiofrequency rhizolysis or intraspinal procedures pending response to present treatment and F/U evaluation   Patient to call [facility] should patient have concerns prior to scheduled return appointment.  General Discharge Instructions :  If you need to reach your doctor call: Monday-Friday 8:00 am - 4:00 pm at [phone] or toll free [phone].  After clinic hours [phone] to have operator reach doctor.  Bring all of your medication bottles to all your appointments in the pain clinic.  To cancel or reschedule your appointment with Pain Management please remember to call 24 hours in advance to avoid a fee.  Refer to the educational materials which you have been given on: General Risks, I had my Procedure. Discharge Instructions, Post Sedation.  Post Procedure Instructions:  The drugs you were given will stay in your system until tomorrow, so for the next 24 hours you should not drive, make any legal decisions or drink any alcoholic beverages.  You may eat anything you prefer, but it is better to start with liquids then soups and crackers, and gradually work up to solid foods.  Please notify your doctor immediately if you have any unusual bleeding, trouble breathing or pain that is not related to your normal pain.  Depending on the type of procedure that was done, some parts of your body may feel week and/or numb.  This usually clears up by tonight or the next day.  Walk with the use of an assistive device or accompanied by an adult for the 24 hours.  You may use ice on the affected area for the first 24 hours.  Put ice in  a Ziploc bag and cover with a towel and place against area 15 minutes on 15 minutes off.  You may switch to heat after 24 hours.GENERAL RISKS AND COMPLICATIONS  What are the risk, side effects and possible complications? Generally speaking, most procedures are safe.  However, with any procedure there are risks, side effects, and the possibility of complications.  The risks and complications are dependent upon the sites that are lesioned, or the type of nerve block to be performed.  The closer the procedure is to the spine, the more serious the risks are.  Great care is taken when placing the radio frequency needles, block needles or lesioning probes, but sometimes complications can occur. 1. Infection: Any time there is an injection through the skin, there is a risk of infection.  This is why sterile conditions are used for these blocks.  There are four possible types of infection. 1. Localized skin infection. 2. Central Nervous System Infection-This can be in the form of Meningitis, which can be deadly. 3. Epidural Infections-This can be in the form of an epidural abscess, which can cause pressure inside of the spine, causing compression of the spinal cord with subsequent paralysis. This would require an emergency surgery to decompress, and there are no guarantees that the patient would recover from the paralysis. 4. Discitis-This is an infection of the intervertebral discs.  It occurs in about 1% of discography procedures.  It is difficult to treat and it may lead to surgery.        2. Pain: the needles  have to go through skin and soft tissues, will cause soreness.       3. Damage to internal structures:  The nerves to be lesioned may be near blood vessels or    other nerves which can be potentially damaged.       4. Bleeding: Bleeding is more common if the patient is taking blood thinners such as  aspirin, Coumadin, Ticiid, Plavix, etc., or if he/she have some genetic predisposition  such as  hemophilia. Bleeding into the spinal canal can cause compression of the spinal  cord with subsequent paralysis.  This would require an emergency surgery to  decompress and there are no guarantees that the patient would recover from the  paralysis.       5. Pneumothorax:  Puncturing of a lung is a possibility, every time a needle is introduced in  the area of the chest or upper back.  Pneumothorax refers to free air around the  collapsed lung(s), inside of the thoracic cavity (chest cavity).  Another two possible  complications related to a similar event would include: Hemothorax and Chylothorax.   These are variations of the Pneumothorax, where instead of air around the collapsed  lung(s), you may have blood or chyle, respectively.       6. Spinal headaches: They may occur with any procedures in the area of the spine.       7. Persistent CSF (Cerebro-Spinal Fluid) leakage: This is a rare problem, but may occur  with prolonged intrathecal or epidural catheters either due to the formation of a fistulous  track or a dural tear.       8. Nerve damage: By working so close to the spinal cord, there is always a possibility of  nerve damage, which could be as serious as a permanent spinal cord injury with  paralysis.       9. Death:  Although rare, severe deadly allergic reactions known as "Anaphylactic  reaction" can occur to any of the medications used.      10. Worsening of the symptoms:  We can always make thing worse.  What are the chances of something like this happening? Chances of any of this occuring are extremely low.  By statistics, you have more of a chance of getting killed in a motor vehicle accident: while driving to the hospital than any of the above occurring .  Nevertheless, you should be aware that they are possibilities.  In general, it is similar to taking a shower.  Everybody knows that you can slip, hit your head and get killed.  Does that mean that you should not shower again?  Nevertheless  always keep in mind that statistics do not mean anything if you happen to be on the wrong side of them.  Even if a procedure has a 1 (one) in a 1,000,000 (million) chance of going wrong, it you happen to be that one..Also, keep in mind that by statistics, you have more of a chance of having something go wrong when taking medications.  Who should not have this procedure? If you are on a blood thinning medication (e.g. Coumadin, Plavix, see list of "Blood Thinners"), or if you have an active infection going on, you should not have the procedure.  If you are taking any blood thinners, please inform your physician.  How should I prepare for this procedure?  Do not eat or drink anything at least six hours prior to the procedure.  Bring a driver with you .  It cannot be a taxi.  Come accompanied by an adult that can drive you back, and that is strong enough to help you if your legs get weak or numb from the local anesthetic.  Take all of your medicines the morning of the procedure with just enough water to swallow them.  If you have diabetes, make sure that you are scheduled to have your procedure done first thing in the morning, whenever possible.  If you have diabetes, take only half of your insulin dose and notify our nurse that you have done so as soon as you arrive at the clinic.  If you are diabetic, but only take blood sugar pills (oral hypoglycemic), then do not take them on the morning of your procedure.  You may take them after you have had the procedure.  Do not take aspirin or any aspirin-containing medications, at least eleven (11) days prior to the procedure.  They may prolong bleeding.  Wear loose fitting clothing that may be easy to take off and that you would not mind if it got stained with Betadine or blood.  Do not wear any jewelry or perfume  Remove any nail coloring.  It will interfere with some of our monitoring equipment.  NOTE: Remember that this is not meant to be  interpreted as a complete list of all possible complications.  Unforeseen problems may occur.  BLOOD THINNERS The following drugs contain aspirin or other products, which can cause increased bleeding during surgery and should not be taken for 2 weeks prior to and 1 week after surgery.  If you should need take something for relief of minor pain, you may take acetaminophen which is found in Tylenol,m Datril, Anacin-3 and Panadol. It is not blood thinner. The products listed below are.  Do not take any of the products listed below in addition to any listed on your instruction sheet.  A.P.C or A.P.C with Codeine Codeine Phosphate Capsules #3 Ibuprofen Ridaura  ABC compound Congesprin Imuran rimadil  Advil Cope Indocin Robaxisal  Alka-Seltzer Effervescent Pain Reliever and Antacid Coricidin or Coricidin-D  Indomethacin Rufen  Alka-Seltzer plus Cold Medicine Cosprin Ketoprofen S-A-C Tablets  Anacin Analgesic Tablets or Capsules Coumadin Korlgesic Salflex  Anacin Extra Strength Analgesic tablets or capsules CP-2 Tablets Lanoril Salicylate  Anaprox Cuprimine Capsules Levenox Salocol  Anexsia-D Dalteparin Magan Salsalate  Anodynos Darvon compound Magnesium Salicylate Sine-off  Ansaid Dasin Capsules Magsal Sodium Salicylate  Anturane Depen Capsules Marnal Soma  APF Arthritis pain formula Dewitt's Pills Measurin Stanback  Argesic Dia-Gesic Meclofenamic Sulfinpyrazone  Arthritis Bayer Timed Release Aspirin Diclofenac Meclomen Sulindac  Arthritis pain formula Anacin Dicumarol Medipren Supac  Analgesic (Safety coated) Arthralgen Diffunasal Mefanamic Suprofen  Arthritis Strength Bufferin Dihydrocodeine Mepro Compound Suprol  Arthropan liquid Dopirydamole Methcarbomol with Aspirin Synalgos  ASA tablets/Enseals Disalcid Micrainin Tagament  Ascriptin Doan's Midol Talwin  Ascriptin A/D Dolene Mobidin Tanderil  Ascriptin Extra Strength Dolobid Moblgesic Ticlid  Ascriptin with Codeine Doloprin or Doloprin  with Codeine Momentum Tolectin  Asperbuf Duoprin Mono-gesic Trendar  Aspergum Duradyne Motrin or Motrin IB Triminicin  Aspirin plain, buffered or enteric coated Durasal Myochrisine Trigesic  Aspirin Suppositories Easprin Nalfon Trillsate  Aspirin with Codeine Ecotrin Regular or Extra Strength Naprosyn Uracel  Atromid-S Efficin Naproxen Ursinus  Auranofin Capsules Elmiron Neocylate Vanquish  Axotal Emagrin Norgesic Verin  Azathioprine Empirin or Empirin with Codeine Normiflo Vitamin E  Azolid Emprazil Nuprin Voltaren  Bayer Aspirin plain, buffered or children's or timed BC Tablets or powders Encaprin Orgaran Warfarin Sodium  Buff-a-Comp Enoxaparin Orudis Zorpin  Buff-a-Comp with Codeine Equegesic Os-Cal-Gesic   Buffaprin Excedrin plain, buffered or Extra Strength Oxalid   Bufferin Arthritis Strength Feldene Oxphenbutazone   Bufferin plain or Extra Strength Feldene Capsules Oxycodone with Aspirin   Bufferin with Codeine Fenoprofen Fenoprofen Pabalate or Pabalate-SF   Buffets II Flogesic Panagesic   Buffinol plain or Extra Strength Florinal or Florinal with Codeine Panwarfarin   Buf-Tabs Flurbiprofen Penicillamine   Butalbital Compound Four-way cold tablets Penicillin   Butazolidin Fragmin Pepto-Bismol   Carbenicillin Geminisyn Percodan   Carna Arthritis Reliever Geopen Persantine   Carprofen Gold's salt Persistin   Chloramphenicol Goody's Phenylbutazone   Chloromycetin Haltrain Piroxlcam   Clmetidine heparin Plaquenil   Cllnoril Hyco-pap Ponstel   Clofibrate Hydroxy chloroquine Propoxyphen         Before stopping any of these medications, be sure to consult the physician who ordered them.  Some, such as Coumadin (Warfarin) are ordered to prevent or treat serious conditions such as "deep thrombosis", "pumonary embolisms", and other heart problems.  The amount of time that you may need off of the medication may also vary with the medication and the reason for which you were taking it.   If you are taking any of these medications, please make sure you notify your pain physician before you undergo any procedures.         Occipital Nerve Block Patient Information  Description: The occipital nerves originate in the cervical (neck) spinal cord and travel upward through muscle and tissue to supply sensation to the back of the head and top of the scalp.  In addition, the nerves control some of the muscles of the scalp.  Occipital neuralgia is an irritation of these nerves which can cause headaches, numbness of the scalp, and neck discomfort.     The occipital nerve block will interrupt nerve transmission through these nerves and can relieve pain and spasm.  The block consists of insertion of a small needle under the skin in the back of the head to deposit local anesthetic (numbing medicine) and/or steroids around the nerve.  The entire block usually lasts less than 5 minutes.  Conditions which may be treated by occipital blocks:   Muscular pain and spasm of the scalp  Nerve irritation, back of the head  Headaches  Upper neck pain  Preparation for the injection:  12. Do not eat any solid food or dairy products within 6 hours of your appointment. 13. You may drink clear liquids up to 2 hours before appointment.  Clear liquids include water, black coffee, juice or soda.  No milk or cream please. 14. You may take your regular medication, including pain medications, with a sip of water before you appointment.  Diabetics should hold regular insulin (if taken separately) and take 1/2 normal NPH dose the morning of the procedure.  Carry some sugar containing items with you to your appointment. 15. A driver must accompany you and be prepared to drive you home after your procedure. 16. Bring all your current medications with you. 17. An IV may be inserted and sedation may be given at the discretion of the physician. 18. A blood pressure cuff, EKG, and other monitors will often be  applied during the procedure.  Some patients may need to have extra oxygen administered for a short period. 19. You will be asked to provide medical information, including your allergies and medications, prior to the procedure.  We must know immediately if you are taking blood thinners (like  Coumadin/Warfarin) or if you are allergic to IV iodine contrast (dye).  We must know if you could possible be pregnant.  20. Do not wear a high collared shirt or turtleneck.  Tie long hair up in the back if possible.  Possible side-effects:   Bleeding from needle site  Infection (rare, may require surgery)  Nerve injury (rare)  Hair on back of neck can be tinged with iodine scrub (this will wash out)  Light-headedness (temporary)  Pain at injection site (several days)  Decreased blood pressure (rare, temporary)  Seizure (very rare)  Call if you experience:   Hives or difficulty breathing ( go to the emergency room)  Inflammation or drainage at the injection site(s)  Please note:  Although the local anesthetic injected can often make your painful muscles or headache feel good for several hours after the injection, the pain may return.  It takes 3-7 days for steroids to work.  You may not notice any pain relief for at least one week.  If effective, we will often do a series of injections spaced 3-6 weeks apart to maximally decrease your pain.  If you have any questions, please call [phone] [facility] o be performed at time return appointment  F/U PCP Dr.Berglund  for evaliation of  BP and general medical  condition  F/U surgical evaluation. Patient will undergo follow-up surgical evaluation as discussed  F/U neurological evaluation. May consider PNCV/EMG studies pending response to treatment and follow-up evaluation  May consider radiofrequency rhizolysis or intraspinal procedures pending response to present treatment and F/U evaluation    Patient to call Pain Management Center should patient have concerns prior to scheduled return appointment.

## 2015-02-09 NOTE — Progress Notes (Signed)
Safety precautions to be maintained throughout the outpatient stay will include: orient to surroundings, keep bed in low position, maintain call bell within reach at all times, provide assistance with transfer out of bed and ambulation.  

## 2015-02-11 ENCOUNTER — Other Ambulatory Visit: Payer: Self-pay

## 2015-02-11 DIAGNOSIS — F411 Generalized anxiety disorder: Secondary | ICD-10-CM

## 2015-02-11 MED ORDER — CLONAZEPAM 0.5 MG PO TABS: 0.5000 mg | ORAL_TABLET | Freq: Every day | ORAL | Status: DC | PRN

## 2015-02-11 NOTE — Telephone Encounter (Signed)
Done

## 2015-02-17 ENCOUNTER — Encounter: Payer: Self-pay | Admitting: Pain Medicine

## 2015-02-17 ENCOUNTER — Ambulatory Visit: Payer: 59 | Attending: Pain Medicine | Admitting: Pain Medicine

## 2015-02-17 VITALS — BP 96/56 | HR 89 | Temp 98.3°F | Resp 18 | Ht 64.0 in | Wt 150.0 lb

## 2015-02-17 DIAGNOSIS — M5136 Other intervertebral disc degeneration, lumbar region: Secondary | ICD-10-CM

## 2015-02-17 DIAGNOSIS — Q761 Klippel-Feil syndrome: Secondary | ICD-10-CM

## 2015-02-17 DIAGNOSIS — M5134 Other intervertebral disc degeneration, thoracic region: Secondary | ICD-10-CM

## 2015-02-17 DIAGNOSIS — Z981 Arthrodesis status: Secondary | ICD-10-CM | POA: Insufficient documentation

## 2015-02-17 DIAGNOSIS — R51 Headache: Secondary | ICD-10-CM | POA: Insufficient documentation

## 2015-02-17 DIAGNOSIS — M5481 Occipital neuralgia: Secondary | ICD-10-CM

## 2015-02-17 DIAGNOSIS — M47812 Spondylosis without myelopathy or radiculopathy, cervical region: Secondary | ICD-10-CM

## 2015-02-17 DIAGNOSIS — M5030 Other cervical disc degeneration, unspecified cervical region: Secondary | ICD-10-CM | POA: Diagnosis not present

## 2015-02-17 DIAGNOSIS — M542 Cervicalgia: Secondary | ICD-10-CM | POA: Diagnosis present

## 2015-02-17 MED ORDER — TRIAMCINOLONE ACETONIDE 40 MG/ML IJ SUSP
INTRAMUSCULAR | Status: AC
  Filled 2015-02-17: qty 1

## 2015-02-17 MED ORDER — SODIUM CHLORIDE 0.9 % IJ SOLN
INTRAMUSCULAR | Status: AC
  Filled 2015-02-17: qty 20

## 2015-02-17 MED ORDER — TRIAMCINOLONE ACETONIDE 40 MG/ML IJ SUSP: 40.0000 mg | Freq: Once | INTRAMUSCULAR | Status: DC

## 2015-02-17 MED ORDER — OXYCODONE-ACETAMINOPHEN 5-325 MG PO TABS: ORAL_TABLET | ORAL | Status: DC

## 2015-02-17 MED ORDER — SODIUM CHLORIDE 0.9 % IJ SOLN: 20.0000 mL | Freq: Once | INTRAMUSCULAR | Status: DC

## 2015-02-17 MED ORDER — ORPHENADRINE CITRATE ER 100 MG PO TB12: ORAL_TABLET | ORAL | Status: DC

## 2015-02-17 NOTE — Patient Instructions (Addendum)
PLAN  Continue present medications and began oxycodone acetaminophen and Norflex as prescribed. These medications can cause respiratory depression excessive sedation confusion and other side effects. Exercise extreme caution when taking these medications  F/U PCP Dr.Berglund  for evaliation of  BP and general medical  condition  F/U surgical evaluation. May consider pending follow-up evaluations  F/U neurological evaluation. May consider pending follow-up evaluations  May consider radiofrequency rhizolysis or intraspinal procedures pending response to present treatment and F/U evaluation   Patient to call Pain Management Center should patient have concerns prior to scheduled return appointment.Pain Management Discharge Instructions  General Discharge Instructions :  If you need to reach your doctor call: Monday-Friday 8:00 am - 4:00 pm at [phone] or toll free [phone].  After clinic hours [phone] to have operator reach doctor.  Bring all of your medication bottles to all your appointments in the pain clinic.  To cancel or reschedule your appointment with Pain Management please remember to call 24 hours in advance to avoid a fee.  Refer to the educational materials which you have been given on: General Risks, I had my Procedure. Discharge Instructions, Post Sedation.  Post Procedure Instructions:  The drugs you were given will stay in your system until tomorrow, so for the next 24 hours you should not drive, make any legal decisions or drink any alcoholic beverages.  You may eat anything you prefer, but it is better to start with liquids then soups and crackers, and gradually work up to solid foods.  Please notify your doctor immediately if you have any unusual bleeding, trouble breathing or pain that is not related to your normal pain.  Depending on the type of procedure that was done, some parts of your body may feel week and/or numb.  This usually clears up by  tonight or the next day.  Walk with the use of an assistive device or accompanied by an adult for the 24 hours.  You may use ice on the affected area for the first 24 hours.  Put ice in a Ziploc bag and cover with a towel and place against area 15 minutes on 15 minutes off.  You may switch to heat after 24 hours.Occipital Nerve Block Patient Information  Description: The occipital nerves originate in the cervical (neck) spinal cord and travel upward through muscle and tissue to supply sensation to the back of the head and top of the scalp.  In addition, the nerves control some of the muscles of the scalp.  Occipital neuralgia is an irritation of these nerves which can cause headaches, numbness of the scalp, and neck discomfort.     The occipital nerve block will interrupt nerve transmission through these nerves and can relieve pain and spasm.  The block consists of insertion of a small needle under the skin in the back of the head to deposit local anesthetic (numbing medicine) and/or steroids around the nerve.  The entire block usually lasts less than 5 minutes.  Conditions which may be treated by occipital blocks:   Muscular pain and spasm of the scalp  Nerve irritation, back of the head  Headaches  Upper neck pain  Preparation for the injection:  1. Do not eat any solid food or dairy products within 6 hours of your appointment. 2. You may drink clear liquids up to 2 hours before appointment.  Clear liquids include water, black coffee, juice or soda.  No milk or cream please. 3. You may take your regular medication, including pain medications,  with a sip of water before you appointment.  Diabetics should hold regular insulin (if taken separately) and take 1/2 normal NPH dose the morning of the procedure.  Carry some sugar containing items with you to your appointment. 4. A driver must accompany you and be prepared to drive you home after your procedure. 5. Bring all your current  medications with you. 6. An IV may be inserted and sedation may be given at the discretion of the physician. 7. A blood pressure cuff, EKG, and other monitors will often be applied during the procedure.  Some patients may need to have extra oxygen administered for a short period. 8. You will be asked to provide medical information, including your allergies and medications, prior to the procedure.  We must know immediately if you are taking blood thinners (like Coumadin/Warfarin) or if you are allergic to IV iodine contrast (dye).  We must know if you could possible be pregnant.  9. Do not wear a high collared shirt or turtleneck.  Tie long hair up in the back if possible.  Possible side-effects:   Bleeding from needle site  Infection (rare, may require surgery)  Nerve injury (rare)  Hair on back of neck can be tinged with iodine scrub (this will wash out)  Light-headedness (temporary)  Pain at injection site (several days)  Decreased blood pressure (rare, temporary)  Seizure (very rare)  Call if you experience:   Hives or difficulty breathing ( go to the emergency room)  Inflammation or drainage at the injection site(s)  Please note:  Although the local anesthetic injected can often make your painful muscles or headache feel good for several hours after the injection, the pain may return.  It takes 3-7 days for steroids to work.  You may not notice any pain relief for at least one week.  If effective, we will often do a series of injections spaced 3-6 weeks apart to maximally decrease your pain.  If you have any questions, please call [phone] [facility] GENERAL RISKS AND COMPLICATIONS  What are the risk, side effects and possible complications? Generally speaking, most procedures are safe.  However, with any procedure there are risks, side effects, and the possibility of complications.  The risks and complications are dependent upon  the sites that are lesioned, or the type of nerve block to be performed.  The closer the procedure is to the spine, the more serious the risks are.  Great care is taken when placing the radio frequency needles, block needles or lesioning probes, but sometimes complications can occur. 1. Infection: Any time there is an injection through the skin, there is a risk of infection.  This is why sterile conditions are used for these blocks.  There are four possible types of infection. 1. Localized skin infection. 2. Central Nervous System Infection-This can be in the form of Meningitis, which can be deadly. 3. Epidural Infections-This can be in the form of an epidural abscess, which can cause pressure inside of the spine, causing compression of the spinal cord with subsequent paralysis. This would require an emergency surgery to decompress, and there are no guarantees that the patient would recover from the paralysis. 4. Discitis-This is an infection of the intervertebral discs.  It occurs in about 1% of discography procedures.  It is difficult to treat and it may lead to surgery.        2. Pain: the needles have to go through skin and soft tissues, will  cause soreness.       3. Damage to internal structures:  The nerves to be lesioned may be near blood vessels or    other nerves which can be potentially damaged.       4. Bleeding: Bleeding is more common if the patient is taking blood thinners such as  aspirin, Coumadin, Ticiid, Plavix, etc., or if he/she have some genetic predisposition  such as hemophilia. Bleeding into the spinal canal can cause compression of the spinal  cord with subsequent paralysis.  This would require an emergency surgery to  decompress and there are no guarantees that the patient would recover from the  paralysis.       5. Pneumothorax:  Puncturing of a lung is a possibility, every time a needle is introduced in  the area of the chest or upper back.  Pneumothorax refers to free air  around the  collapsed lung(s), inside of the thoracic cavity (chest cavity).  Another two possible  complications related to a similar event would include: Hemothorax and Chylothorax.   These are variations of the Pneumothorax, where instead of air around the collapsed  lung(s), you may have blood or chyle, respectively.       6. Spinal headaches: They may occur with any procedures in the area of the spine.       7. Persistent CSF (Cerebro-Spinal Fluid) leakage: This is a rare problem, but may occur  with prolonged intrathecal or epidural catheters either due to the formation of a fistulous  track or a dural tear.       8. Nerve damage: By working so close to the spinal cord, there is always a possibility of  nerve damage, which could be as serious as a permanent spinal cord injury with  paralysis.       9. Death:  Although rare, severe deadly allergic reactions known as "Anaphylactic  reaction" can occur to any of the medications used.      10. Worsening of the symptoms:  We can always make thing worse.  What are the chances of something like this happening? Chances of any of this occuring are extremely low.  By statistics, you have more of a chance of getting killed in a motor vehicle accident: while driving to the hospital than any of the above occurring .  Nevertheless, you should be aware that they are possibilities.  In general, it is similar to taking a shower.  Everybody knows that you can slip, hit your head and get killed.  Does that mean that you should not shower again?  Nevertheless always keep in mind that statistics do not mean anything if you happen to be on the wrong side of them.  Even if a procedure has a 1 (one) in a 1,000,000 (million) chance of going wrong, it you happen to be that one..Also, keep in mind that by statistics, you have more of a chance of having something go wrong when taking medications.  Who should not have this procedure? If you are on a blood thinning medication  (e.g. Coumadin, Plavix, see list of "Blood Thinners"), or if you have an active infection going on, you should not have the procedure.  If you are taking any blood thinners, please inform your physician.  How should I prepare for this procedure?  Do not eat or drink anything at least six hours prior to the procedure.  Bring a driver with you .  It cannot be a taxi.  Come accompanied by  an adult that can drive you back, and that is strong enough to help you if your legs get weak or numb from the local anesthetic.  Take all of your medicines the morning of the procedure with just enough water to swallow them.  If you have diabetes, make sure that you are scheduled to have your procedure done first thing in the morning, whenever possible.  If you have diabetes, take only half of your insulin dose and notify our nurse that you have done so as soon as you arrive at the clinic.  If you are diabetic, but only take blood sugar pills (oral hypoglycemic), then do not take them on the morning of your procedure.  You may take them after you have had the procedure.  Do not take aspirin or any aspirin-containing medications, at least eleven (11) days prior to the procedure.  They may prolong bleeding.  Wear loose fitting clothing that may be easy to take off and that you would not mind if it got stained with Betadine or blood.  Do not wear any jewelry or perfume  Remove any nail coloring.  It will interfere with some of our monitoring equipment.  NOTE: Remember that this is not meant to be interpreted as a complete list of all possible complications.  Unforeseen problems may occur.  BLOOD THINNERS The following drugs contain aspirin or other products, which can cause increased bleeding during surgery and should not be taken for 2 weeks prior to and 1 week after surgery.  If you should need take something for relief of minor pain, you may take acetaminophen which is found in Tylenol,m Datril, Anacin-3  and Panadol. It is not blood thinner. The products listed below are.  Do not take any of the products listed below in addition to any listed on your instruction sheet.  A.P.C or A.P.C with Codeine Codeine Phosphate Capsules #3 Ibuprofen Ridaura  ABC compound Congesprin Imuran rimadil  Advil Cope Indocin Robaxisal  Alka-Seltzer Effervescent Pain Reliever and Antacid Coricidin or Coricidin-D  Indomethacin Rufen  Alka-Seltzer plus Cold Medicine Cosprin Ketoprofen S-A-C Tablets  Anacin Analgesic Tablets or Capsules Coumadin Korlgesic Salflex  Anacin Extra Strength Analgesic tablets or capsules CP-2 Tablets Lanoril Salicylate  Anaprox Cuprimine Capsules Levenox Salocol  Anexsia-D Dalteparin Magan Salsalate  Anodynos Darvon compound Magnesium Salicylate Sine-off  Ansaid Dasin Capsules Magsal Sodium Salicylate  Anturane Depen Capsules Marnal Soma  APF Arthritis pain formula Dewitt's Pills Measurin Stanback  Argesic Dia-Gesic Meclofenamic Sulfinpyrazone  Arthritis Bayer Timed Release Aspirin Diclofenac Meclomen Sulindac  Arthritis pain formula Anacin Dicumarol Medipren Supac  Analgesic (Safety coated) Arthralgen Diffunasal Mefanamic Suprofen  Arthritis Strength Bufferin Dihydrocodeine Mepro Compound Suprol  Arthropan liquid Dopirydamole Methcarbomol with Aspirin Synalgos  ASA tablets/Enseals Disalcid Micrainin Tagament  Ascriptin Doan's Midol Talwin  Ascriptin A/D Dolene Mobidin Tanderil  Ascriptin Extra Strength Dolobid Moblgesic Ticlid  Ascriptin with Codeine Doloprin or Doloprin with Codeine Momentum Tolectin  Asperbuf Duoprin Mono-gesic Trendar  Aspergum Duradyne Motrin or Motrin IB Triminicin  Aspirin plain, buffered or enteric coated Durasal Myochrisine Trigesic  Aspirin Suppositories Easprin Nalfon Trillsate  Aspirin with Codeine Ecotrin Regular or Extra Strength Naprosyn Uracel  Atromid-S Efficin Naproxen Ursinus  Auranofin Capsules Elmiron Neocylate Vanquish  Axotal Emagrin  Norgesic Verin  Azathioprine Empirin or Empirin with Codeine Normiflo Vitamin E  Azolid Emprazil Nuprin Voltaren  Bayer Aspirin plain, buffered or children's or timed BC Tablets or powders Encaprin Orgaran Warfarin Sodium  Buff-a-Comp Enoxaparin Orudis Zorpin  Buff-a-Comp with Codeine  Equegesic Os-Cal-Gesic   Buffaprin Excedrin plain, buffered or Extra Strength Oxalid   Bufferin Arthritis Strength Feldene Oxphenbutazone   Bufferin plain or Extra Strength Feldene Capsules Oxycodone with Aspirin   Bufferin with Codeine Fenoprofen Fenoprofen Pabalate or Pabalate-SF   Buffets II Flogesic Panagesic   Buffinol plain or Extra Strength Florinal or Florinal with Codeine Panwarfarin   Buf-Tabs Flurbiprofen Penicillamine   Butalbital Compound Four-way cold tablets Penicillin   Butazolidin Fragmin Pepto-Bismol   Carbenicillin Geminisyn Percodan   Carna Arthritis Reliever Geopen Persantine   Carprofen Gold's salt Persistin   Chloramphenicol Goody's Phenylbutazone   Chloromycetin Haltrain Piroxlcam   Clmetidine heparin Plaquenil   Cllnoril Hyco-pap Ponstel   Clofibrate Hydroxy chloroquine Propoxyphen         Before stopping any of these medications, be sure to consult the physician who ordered them.  Some, such as Coumadin (Warfarin) are ordered to prevent or treat serious conditions such as "deep thrombosis", "pumonary embolisms", and other heart problems.  The amount of time that you may need off of the medication may also vary with the medication and the reason for which you were taking it.  If you are taking any of these medications, please make sure you notify your pain physician before you undergo any procedures.

## 2015-02-17 NOTE — Progress Notes (Signed)
Safety precautions to be maintained throughout the outpatient stay will include: orient to surroundings, keep bed in low position, maintain call bell within reach at all times, provide assistance with transfer out of bed and ambulation.  

## 2015-02-17 NOTE — Progress Notes (Signed)
   Subjective:    Patient ID: Theresa Murphy, female    DOB: 1975-09-05, 39 y.o.   MRN: [id number]  HPI  NOTE: The patient is a 39 y.o.-year-old female who returns to the Pain Management Center for further evaluation and treatment of pain consisting of pain involving the region of the neck and headache.  Patient is with prior studies revealing patient to be with Degenerative disc disease of the cervical spine  Status post cervical fusion . The patient is a pain radiating from the back of the neck to the back of the head precipitating significant headaches. There is concern regarding patient being with greater occipital neuralgia .  The risks, benefits, and expectations of the procedure have been discussed and explained to patient, who is understanding and wishes to proceed with interventional treatment as discussed and as explained to patient.  Will proceed with greater occipital nerve blocks with myoneural block injections at this time as discussed and as explained to patient.  All are understanding and in agreement with suggested treatment plan.    PROCEDURE:  Greater occipital nerve block on the left side with IV Versed, IV Fentanyl, conscious sedation, EKG, blood pressure, pulse, pulse oximetry monitoring.  Procedure performed with patient in prone position.  Greater occipital nerve block on the left side.   With patient in prone position, Betadine prep of proposed entry site accomplished.  Following identification of the nuchal ridge, 22 -gauge needle was inserted at the level of the nuchal ridge medial to the occipital artery.  Following negative aspiration, 4cc preservative-free normal saline with Kenalog was injected for left greater occipital nerve block.  Needle was removed.  Patient tolerated injection well.   Greater occipital nerve block on the rightt side. The greater occipital nerve block on the right side was performed exactly as the left greater occipital nerve block was performed and  utilizing the same technique.   A total of 10 mg Kenalog was utilized for the entire procedure.  PLAN:    1. Medications: Will continue presently prescribed medications at this time oxycodone and we will begin Norflex 2. Patient to follow up with primary care physician Dr.Berglund  for evaluation of blood pressure and general medical condition status post procedure performed on today's visit. 3. Neurological evaluation for further assessment of headaches for further studies as discussed. 4. Surgical evaluation as discussed.  5. Patient may be candidate for Botox injections, radiofrequency procedures, as well as implantation type procedures pending response to treatment rendered on today's visit and pending follow-up evaluation. 6. Patient has been advised to adhere to proper body mechanics and to avoid activities which appear to aggravate condition.cations:  Will continue presently prescribed medications at this time. 7. The patient is understanding and in agreement with the suggested treatment plan.      Review of Systems     Objective:   Physical Exam        Assessment & Plan:

## 2015-02-18 ENCOUNTER — Telehealth: Payer: Self-pay | Admitting: *Deleted

## 2015-02-18 NOTE — Telephone Encounter (Signed)
Left voice mail re; procedure on yesterday to call our office for problems or concerns.

## 2015-03-01 ENCOUNTER — Other Ambulatory Visit: Payer: Self-pay | Admitting: Pain Medicine

## 2015-03-15 ENCOUNTER — Ambulatory Visit: Payer: 59 | Attending: Pain Medicine | Admitting: Pain Medicine

## 2015-03-15 ENCOUNTER — Encounter: Payer: Self-pay | Admitting: Pain Medicine

## 2015-03-15 VITALS — BP 99/65 | HR 82 | Temp 98.2°F | Resp 16 | Ht 61.0 in | Wt 150.0 lb

## 2015-03-15 DIAGNOSIS — M5030 Other cervical disc degeneration, unspecified cervical region: Secondary | ICD-10-CM | POA: Diagnosis not present

## 2015-03-15 DIAGNOSIS — M79602 Pain in left arm: Secondary | ICD-10-CM | POA: Diagnosis present

## 2015-03-15 DIAGNOSIS — M5481 Occipital neuralgia: Secondary | ICD-10-CM

## 2015-03-15 DIAGNOSIS — M7060 Trochanteric bursitis, unspecified hip: Secondary | ICD-10-CM | POA: Insufficient documentation

## 2015-03-15 DIAGNOSIS — G43909 Migraine, unspecified, not intractable, without status migrainosus: Secondary | ICD-10-CM | POA: Insufficient documentation

## 2015-03-15 DIAGNOSIS — M533 Sacrococcygeal disorders, not elsewhere classified: Secondary | ICD-10-CM | POA: Insufficient documentation

## 2015-03-15 DIAGNOSIS — Z981 Arthrodesis status: Secondary | ICD-10-CM | POA: Diagnosis not present

## 2015-03-15 DIAGNOSIS — M47812 Spondylosis without myelopathy or radiculopathy, cervical region: Secondary | ICD-10-CM

## 2015-03-15 DIAGNOSIS — M5136 Other intervertebral disc degeneration, lumbar region: Secondary | ICD-10-CM

## 2015-03-15 DIAGNOSIS — M542 Cervicalgia: Secondary | ICD-10-CM | POA: Diagnosis present

## 2015-03-15 DIAGNOSIS — M5134 Other intervertebral disc degeneration, thoracic region: Secondary | ICD-10-CM

## 2015-03-15 DIAGNOSIS — M5010 Cervical disc disorder with radiculopathy, unspecified cervical region: Secondary | ICD-10-CM

## 2015-03-15 DIAGNOSIS — R51 Headache: Secondary | ICD-10-CM | POA: Diagnosis present

## 2015-03-15 DIAGNOSIS — G43109 Migraine with aura, not intractable, without status migrainosus: Secondary | ICD-10-CM

## 2015-03-15 DIAGNOSIS — Q761 Klippel-Feil syndrome: Secondary | ICD-10-CM

## 2015-03-15 MED ORDER — OXYCODONE-ACETAMINOPHEN 5-325 MG PO TABS: ORAL_TABLET | ORAL | Status: DC

## 2015-03-15 MED ORDER — TIZANIDINE HCL 2 MG PO CAPS: ORAL_CAPSULE | ORAL | Status: DC

## 2015-03-15 NOTE — Progress Notes (Signed)
Subjective:    Patient ID: Theresa Murphy, female    DOB: 1975-04-28, 39 y.o.   MRN: [id number]  HPI  Patient is a 39 year old female who returns to pain management Center for further evaluation and treatment of pain involving the neck upper extremity region as well as headaches. Patient is that is post cervical fusion and is with pain occurring in the region of the neck and radiating to the upper extremity. We discussed patient undergoing region evaluation by neurosurgeon and will proceed with neurosurgical reevaluation as well as proceed with cervical facet, medial branch nerve, blocks at time return appointment as discussed ask spine with patient patient prefer to avoid cervical epidural steroid injection at this time. We will also replace Norflex with Zanaflex and we'll continue to observe response to present treatment. We discussed neurological evaluation of headaches and will schedule patient for neurological evaluation of headaches as well as observed response to interventional treatment of the cervical facets and and the prescribing of Zanaflex in terms of improving patient's headache be May consider procedures such as stellate ganglion block, sphenopalatine ganglion block, and other procedures to treat headaches as well. At the present time we'll proceed with cervical facet, medial branch nerve, blocks in attempt to decrease severity of patient's symptoms involving the cervical and upper extremity regions as well as minimize progression of symptoms and avoid need for more involved treatment. We cautioned patient regarding respiratory depression confusion and other side effects of Zanaflex. The patient was understanding and agreed to suggested treatment plan  Review of Systems     Objective:   Physical Exam  There was tends to palpation of paraspinal muscular region cervical and cervical facet region of moderate degree there was limited range of motion of the cervical spine and well-healed  cervical scar of the anterior cervical region without increased warmth and erythema in the region of the scar. Patient appeared to be with decreased grip strength. Tinel and Phalen's maneuver were without increased pain of significant degree. There was unremarkable Spurling's maneuver on today's evaluation. Patient was with limited range of motion of the cervical spine with discomfort on rotation flexion and extension of the cervical region without definitely  positive Spurling's maneuver. Palpation over the thoracic facet thoracic paraspinal musculature region was with evidence of muscle spasms as well. There was no crepitus of the thoracic region noted. Palpation over the lumbar paraspinal musculature region lumbar facet region was attends to palpation of mild to moderate degree Lateral bending rotation extension and palpation of the lumbar facets reproduce mild to moderate palpation over the PSIS and PII S region associated with mild discomfort. Mild tenderness of the greater trochanteric region iliotibial band region. Straight leg raising tolerates approximately 30 without increased pain with dorsiflexion noted. No definite sensory deficit or dermatomal distribution detected. Negative clonus negative Homans. Abdomen nontender with no costovertebral tenderness noted.      Assessment & Plan:  Bilateral occipital neuralgia   Migraine headache  Degenerative disc disease of the cervical spine  Status post cervical fusion ACDF  Cervical facet syndrome   Sacroiliac joint dysfunction   Greater trochanteric bursitis     PLAN     Continue present medications oxycodone acetaminophen and begin  Zanaflex  NO NORFLEX. Zanaflex can cause respiratory depression confusion excessive sedation and other side effects. Exercise extreme caution when taking Zanaflex   Cervical cervical facet, medial branch nerve blocks to be performed at time return appointment   F/U PCP Dr Judithann Graves  for evaliation of  BP  and general medical condition . Follow-up with Dr Judithann GravesBerglund  to discuss neurosurgical reevaluation as well  F/U surgical evaluation. Neurosurgical reevaluation as discussed  F/U neurological evaluation. Ask receptionist date of neurological evaluation of pain of cervical and upper extremity region and headaches  May consider radiofrequency rhizolysis or intraspinal procedures pending response to present treatment and F/U evaluation   Patient to call Pain Management Center should patient have concerns prior to scheduled return appointment

## 2015-03-15 NOTE — Progress Notes (Signed)
Safety precautions to be maintained throughout the outpatient stay will include: orient to surroundings, keep bed in low position, maintain call bell within reach at all times, provide assistance with transfer out of bed and ambulation.  

## 2015-03-15 NOTE — Patient Instructions (Addendum)
7   PLAN   Continue present medications oxycodone acetaminophen and begin  Zanaflex  NO NORFLEX. Zanaflex can cause respiratory depression confusion excessive sedation and other side effects. Exercise extreme caution when taking Zanaflex   Cervical cervical facet, medial branch nerve blocks to be performed at time return appointment   F/U PCP Dr Judithann GravesBerglund  for evaliation of  BP and general medical condition . Follow-up with Dr Judithann GravesBerglund  to discuss neurosurgical reevaluation as well  F/U surgical evaluation. Neurosurgical reevaluation as discussed  F/U neurological evaluation. Ask receptionist date of neurological evaluation of pain of cervical and upper extremity region and headaches  May consider radiofrequency rhizolysis or intraspinal procedures pending response to present treatment and F/U evaluation   Patient to call [facility] should patient have concerns prior to scheduled return appointment.Pain Management Discharge Instructions  General Discharge Instructions :  If you need to reach your doctor call: Monday-Friday 8:00 am - 4:00 pm at [phone] or toll free [phone].  After clinic hours [phone] to have operator reach doctor.  Bring all of your medication bottles to all your appointments in the pain clinic.  To cancel or reschedule your appointment with Pain Management please remember to call 24 hours in advance to avoid a fee.  Refer to the educational materials which you have been given on: General Risks, I had my Procedure. Discharge Instructions, Post Sedation.  Post Procedure Instructions:  The drugs you were given will stay in your system until tomorrow, so for the next 24 hours you should not drive, make any legal decisions or drink any alcoholic beverages.  You may eat anything you prefer, but it is better to start with liquids then soups and crackers, and gradually work up to solid foods.  Please notify your doctor immediately if you have  any unusual bleeding, trouble breathing or pain that is not related to your normal pain.  Depending on the type of procedure that was done, some parts of your body may feel week and/or numb.  This usually clears up by tonight or the next day.  Walk with the use of an assistive device or accompanied by an adult for the 24 hours.  You may use ice on the affected area for the first 24 hours.  Put ice in a Ziploc bag and cover with a towel and place against area 15 minutes on 15 minutes off.  You may switch to heat after 24 hours.GENERAL RISKS AND COMPLICATIONS  What are the risk, side effects and possible complications? Generally speaking, most procedures are safe.  However, with any procedure there are risks, side effects, and the possibility of complications.  The risks and complications are dependent upon the sites that are lesioned, or the type of nerve block to be performed.  The closer the procedure is to the spine, the more serious the risks are.  Great care is taken when placing the radio frequency needles, block needles or lesioning probes, but sometimes complications can occur. 1. Infection: Any time there is an injection through the skin, there is a risk of infection.  This is why sterile conditions are used for these blocks.  There are four possible types of infection. 1. Localized skin infection. 2. Central Nervous System Infection-This can be in the form of Meningitis, which can be deadly. 3. Epidural Infections-This can be in the form of an epidural abscess, which can cause pressure inside of the spine, causing compression of the spinal cord with subsequent paralysis. This would require  an emergency surgery to decompress, and there are no guarantees that the patient would recover from the paralysis. 4. Discitis-This is an infection of the intervertebral discs.  It occurs in about 1% of discography procedures.  It is difficult to treat and it may lead to surgery.        2. Pain: the  needles have to go through skin and soft tissues, will cause soreness.       3. Damage to internal structures:  The nerves to be lesioned may be near blood vessels or    other nerves which can be potentially damaged.       4. Bleeding: Bleeding is more common if the patient is taking blood thinners such as  aspirin, Coumadin, Ticiid, Plavix, etc., or if he/she have some genetic predisposition  such as hemophilia. Bleeding into the spinal canal can cause compression of the spinal  cord with subsequent paralysis.  This would require an emergency surgery to  decompress and there are no guarantees that the patient would recover from the  paralysis.       5. Pneumothorax:  Puncturing of a lung is a possibility, every time a needle is introduced in  the area of the chest or upper back.  Pneumothorax refers to free air around the  collapsed lung(s), inside of the thoracic cavity (chest cavity).  Another two possible  complications related to a similar event would include: Hemothorax and Chylothorax.   These are variations of the Pneumothorax, where instead of air around the collapsed  lung(s), you may have blood or chyle, respectively.       6. Spinal headaches: They may occur with any procedures in the area of the spine.       7. Persistent CSF (Cerebro-Spinal Fluid) leakage: This is a rare problem, but may occur  with prolonged intrathecal or epidural catheters either due to the formation of a fistulous  track or a dural tear.       8. Nerve damage: By working so close to the spinal cord, there is always a possibility of  nerve damage, which could be as serious as a permanent spinal cord injury with  paralysis.       9. Death:  Although rare, severe deadly allergic reactions known as "Anaphylactic  reaction" can occur to any of the medications used.      10. Worsening of the symptoms:  We can always make thing worse.  What are the chances of something like this happening? Chances of any of this occuring are  extremely low.  By statistics, you have more of a chance of getting killed in a motor vehicle accident: while driving to the hospital than any of the above occurring .  Nevertheless, you should be aware that they are possibilities.  In general, it is similar to taking a shower.  Everybody knows that you can slip, hit your head and get killed.  Does that mean that you should not shower again?  Nevertheless always keep in mind that statistics do not mean anything if you happen to be on the wrong side of them.  Even if a procedure has a 1 (one) in a 1,000,000 (million) chance of going wrong, it you happen to be that one..Also, keep in mind that by statistics, you have more of a chance of having something go wrong when taking medications.  Who should not have this procedure? If you are on a blood thinning medication (e.g. Coumadin, Plavix, see list of "Blood  Thinners"), or if you have an active infection going on, you should not have the procedure.  If you are taking any blood thinners, please inform your physician.  How should I prepare for this procedure?  Do not eat or drink anything at least six hours prior to the procedure.  Bring a driver with you .  It cannot be a taxi.  Come accompanied by an adult that can drive you back, and that is strong enough to help you if your legs get weak or numb from the local anesthetic.  Take all of your medicines the morning of the procedure with just enough water to swallow them.  If you have diabetes, make sure that you are scheduled to have your procedure done first thing in the morning, whenever possible.  If you have diabetes, take only half of your insulin dose and notify our nurse that you have done so as soon as you arrive at the clinic.  If you are diabetic, but only take blood sugar pills (oral hypoglycemic), then do not take them on the morning of your procedure.  You may take them after you have had the procedure.  Do not take aspirin or any  aspirin-containing medications, at least eleven (11) days prior to the procedure.  They may prolong bleeding.  Wear loose fitting clothing that may be easy to take off and that you would not mind if it got stained with Betadine or blood.  Do not wear any jewelry or perfume  Remove any nail coloring.  It will interfere with some of our monitoring equipment.  NOTE: Remember that this is not meant to be interpreted as a complete list of all possible complications.  Unforeseen problems may occur.  BLOOD THINNERS The following drugs contain aspirin or other products, which can cause increased bleeding during surgery and should not be taken for 2 weeks prior to and 1 week after surgery.  If you should need take something for relief of minor pain, you may take acetaminophen which is found in Tylenol,m Datril, Anacin-3 and Panadol. It is not blood thinner. The products listed below are.  Do not take any of the products listed below in addition to any listed on your instruction sheet.  A.P.C or A.P.C with Codeine Codeine Phosphate Capsules #3 Ibuprofen Ridaura  ABC compound Congesprin Imuran rimadil  Advil Cope Indocin Robaxisal  Alka-Seltzer Effervescent Pain Reliever and Antacid Coricidin or Coricidin-D  Indomethacin Rufen  Alka-Seltzer plus Cold Medicine Cosprin Ketoprofen S-A-C Tablets  Anacin Analgesic Tablets or Capsules Coumadin Korlgesic Salflex  Anacin Extra Strength Analgesic tablets or capsules CP-2 Tablets Lanoril Salicylate  Anaprox Cuprimine Capsules Levenox Salocol  Anexsia-D Dalteparin Magan Salsalate  Anodynos Darvon compound Magnesium Salicylate Sine-off  Ansaid Dasin Capsules Magsal Sodium Salicylate  Anturane Depen Capsules Marnal Soma  APF Arthritis pain formula Dewitt's Pills Measurin Stanback  Argesic Dia-Gesic Meclofenamic Sulfinpyrazone  Arthritis Bayer Timed Release Aspirin Diclofenac Meclomen Sulindac  Arthritis pain formula Anacin Dicumarol Medipren Supac  Analgesic  (Safety coated) Arthralgen Diffunasal Mefanamic Suprofen  Arthritis Strength Bufferin Dihydrocodeine Mepro Compound Suprol  Arthropan liquid Dopirydamole Methcarbomol with Aspirin Synalgos  ASA tablets/Enseals Disalcid Micrainin Tagament  Ascriptin Doan's Midol Talwin  Ascriptin A/D Dolene Mobidin Tanderil  Ascriptin Extra Strength Dolobid Moblgesic Ticlid  Ascriptin with Codeine Doloprin or Doloprin with Codeine Momentum Tolectin  Asperbuf Duoprin Mono-gesic Trendar  Aspergum Duradyne Motrin or Motrin IB Triminicin  Aspirin plain, buffered or enteric coated Durasal Myochrisine Trigesic  Aspirin Suppositories Easprin Nalfon Trillsate  Aspirin with Codeine Ecotrin Regular or Extra Strength Naprosyn Uracel  Atromid-S Efficin Naproxen Ursinus  Auranofin Capsules Elmiron Neocylate Vanquish  Axotal Emagrin Norgesic Verin  Azathioprine Empirin or Empirin with Codeine Normiflo Vitamin E  Azolid Emprazil Nuprin Voltaren  Bayer Aspirin plain, buffered or children's or timed BC Tablets or powders Encaprin Orgaran Warfarin Sodium  Buff-a-Comp Enoxaparin Orudis Zorpin  Buff-a-Comp with Codeine Equegesic Os-Cal-Gesic   Buffaprin Excedrin plain, buffered or Extra Strength Oxalid   Bufferin Arthritis Strength Feldene Oxphenbutazone   Bufferin plain or Extra Strength Feldene Capsules Oxycodone with Aspirin   Bufferin with Codeine Fenoprofen Fenoprofen Pabalate or Pabalate-SF   Buffets II Flogesic Panagesic   Buffinol plain or Extra Strength Florinal or Florinal with Codeine Panwarfarin   Buf-Tabs Flurbiprofen Penicillamine   Butalbital Compound Four-way cold tablets Penicillin   Butazolidin Fragmin Pepto-Bismol   Carbenicillin Geminisyn Percodan   Carna Arthritis Reliever Geopen Persantine   Carprofen Gold's salt Persistin   Chloramphenicol Goody's Phenylbutazone   Chloromycetin Haltrain Piroxlcam   Clmetidine heparin Plaquenil   Cllnoril Hyco-pap Ponstel   Clofibrate Hydroxy chloroquine  Propoxyphen         Before stopping any of these medications, be sure to consult the physician who ordered them.  Some, such as Coumadin (Warfarin) are ordered to prevent or treat serious conditions such as "deep thrombosis", "pumonary embolisms", and other heart problems.  The amount of time that you may need off of the medication may also vary with the medication and the reason for which you were taking it.  If you are taking any of these medications, please make sure you notify your pain physician before you undergo any procedures.         Facet Joint Block, Care After Refer to this sheet in the next few weeks. These instructions provide you with information on caring for yourself after your procedure. Your health care provider may also give you more specific instructions. Your treatment has been planned according to current medical practices, but problems sometimes occur. Call your health care provider if you have any problems or questions after your procedure. HOME CARE INSTRUCTIONS  2. Keep track of the amount of pain relief you feel and how long it lasts. 3. Limit pain medicine within the first 4-6 hours after the procedure as directed by your health care provider. 4. Resume taking dietary supplements and medicines as directed by your health care provider. 5. You may resume your regular diet. 6. Do not apply heat near or over the injection site(s) for 24 hours.  7. Do not take a bath or soak in water (such as a pool or lake) for 24 hours. 8. Do not drive for 24 hours unless approved by your health care provider. 9. Avoid strenuous activity for 24 hours. 10. Remove your bandages the morning after the procedure.  11. If the injection site is tender, applying an ice pack may relieve some tenderness. To do this: 1. Put ice in a bag. 2. Place a towel between your skin and the bag. 3. Leave the ice on for 15-20 minutes, 3-4 times a day. 12. Keep follow-up appointments as directed by  your health care provider. SEEK MEDICAL CARE IF:   Your pain is not controlled by your medicines.   There is drainage from the injection site.   There is significant bleeding or swelling at the injection site.  You have diabetes and your blood sugar is above 180 mg/dL. SEEK IMMEDIATE MEDICAL CARE IF:   You  develop a fever of 101F (38.3C) or greater.   You have worsening pain or swelling around the injection site.   You have red streaking around the injection site.   You develop severe pain that is not controlled by your medicines.   You develop a headache, stiff neck, nausea, or vomiting.   Your eyes become very sensitive to light.   You have weakness, paralysis, or tingling in your arms or legs that was not present before the procedure.   You develop difficulty urinating or breathing.    This information is not intended to replace advice given to you by your health care provider. Make sure you discuss any questions you have with your health care provider.   Document Released: 03/13/2012 Document Revised: 04/17/2014 Document Reviewed: 03/13/2012 Elsevier Interactive Patient Education 2016 [employer] Facet Joint Block The facet joints connect the bones of the spine (vertebrae). They make it possible for you to bend, twist, and make other movements with your spine. They also prevent you from overbending, overtwisting, and making other excessive movements.  A facet joint block is a procedure where a numbing medicine (anesthetic) is injected into a facet joint. Often, a type of anti-inflammatory medicine called a steroid is also injected. A facet joint block may be done for two reasons:  13. Diagnosis. A facet joint block may be done as a test to see whether neck or back pain is caused by a worn-down or infected facet joint. If the pain gets better after a facet joint block, it means the pain is probably coming from the facet joint. If the pain does not get better, it  means the pain is probably not coming from the facet joint.  14. Therapy. A facet joint block may be done to relieve neck or back pain caused by a facet joint. A facet joint block is only done as a therapy if the pain does not improve with medicine, exercise programs, physical therapy, and other forms of pain management. LET [facility] CARE PROVIDER KNOW ABOUT:   Any allergies you have.   All medicines you are taking, including vitamins, herbs, eyedrops, and over-the-counter medicines and creams.   Previous problems you or members of your family have had with the use of anesthetics.   Any blood disorders you have had.   Other health problems you have. RISKS AND COMPLICATIONS Generally, having a facet joint block is safe. However, as with any procedure, complications can occur. Possible complications associated with having a facet joint block include:   Bleeding.   Injury to a nerve near the injection site.   Pain at the injection site.   Weakness or numbness in areas controlled by nerves near the injection site.   Infection.   Temporary fluid retention.   Allergic reaction to anesthetics or medicines used during the procedure. BEFORE THE PROCEDURE   Follow your health care provider's instructions if you are taking dietary supplements or medicines. You may need to stop taking them or reduce your dosage.   Do not take any new dietary supplements or medicines without asking your health care provider first.   Follow your health care provider's instructions about eating and drinking before the procedure. You may need to stop eating and drinking several hours before the procedure.   Arrange to have an adult drive you home after the procedure. PROCEDURE 12. You may need to remove your clothing and dress in an open-back gown so that your health care provider can access your spine.  13. The procedure will be done while you are lying on an X-ray table. Most of the time  you will be asked to lie on your stomach, but you may be asked to lie in a different position if an injection will be made in your neck.  14. Special machines will be used to monitor your oxygen levels, heart rate, and blood pressure.  15. If an injection will be made in your neck, an intravenous (IV) tube will be inserted into one of your veins. Fluids and medicine will flow directly into your body through the IV tube.  16. The area over the facet joint where the injection will be made will be cleaned with an antiseptic soap. The surrounding skin will be covered with sterile drapes.  17. An anesthetic will be applied to your skin to make the injection area numb. You may feel a temporary stinging or burning sensation.  18. A video X-ray machine will be used to locate the joint. A contrast dye may be injected into the facet joint area to help with locating the joint.  19. When the joint is located, an anesthetic medicine will be injected into the joint through the needle.  20. Your health care provider will ask you whether you feel pain relief. If you do feel relief, a steroid may be injected to provide pain relief for a longer period of time. If you do not feel relief or feel only partial relief, additional injections of an anesthetic may be made in other facet joints.  21. The needle will be removed, the skin will be cleansed, and bandages will be applied.  AFTER THE PROCEDURE   You will be observed for 15-30 minutes before being allowed to go home. Do not drive. Have an adult drive you or take a taxi or public transportation instead.   If you feel pain relief, the pain will return in several hours or days when the anesthetic wears off.   You may feel pain relief 2-14 days after the procedure. The amount of time this relief lasts varies from person to person.   It is normal to feel some tenderness over the injected area(s) for 2 days following the procedure.   If you have diabetes,  you may have a temporary increase in blood sugar.   This information is not intended to replace advice given to you by your health care provider. Make sure you discuss any questions you have with your health care provider.   Document Released: 08/16/2006 Document Revised: 04/17/2014 Document Reviewed: 01/15/2012 Elsevier Interactive Patient Education [employer]

## 2015-03-29 ENCOUNTER — Ambulatory Visit: Payer: 59 | Admitting: Pain Medicine

## 2015-04-06 ENCOUNTER — Encounter: Payer: Self-pay | Admitting: Internal Medicine

## 2015-04-06 ENCOUNTER — Other Ambulatory Visit: Payer: Self-pay | Admitting: Internal Medicine

## 2015-04-06 DIAGNOSIS — M5030 Other cervical disc degeneration, unspecified cervical region: Secondary | ICD-10-CM

## 2015-04-06 DIAGNOSIS — G43109 Migraine with aura, not intractable, without status migrainosus: Secondary | ICD-10-CM

## 2015-04-14 ENCOUNTER — Ambulatory Visit: Payer: 59 | Attending: Pain Medicine | Admitting: Pain Medicine

## 2015-04-14 ENCOUNTER — Encounter: Payer: Self-pay | Admitting: Pain Medicine

## 2015-04-14 VITALS — BP 98/62 | HR 64 | Temp 98.4°F | Resp 16 | Ht 64.0 in | Wt 150.0 lb

## 2015-04-14 DIAGNOSIS — Q761 Klippel-Feil syndrome: Secondary | ICD-10-CM

## 2015-04-14 DIAGNOSIS — M47812 Spondylosis without myelopathy or radiculopathy, cervical region: Secondary | ICD-10-CM

## 2015-04-14 DIAGNOSIS — Z981 Arthrodesis status: Secondary | ICD-10-CM | POA: Diagnosis not present

## 2015-04-14 DIAGNOSIS — M5030 Other cervical disc degeneration, unspecified cervical region: Secondary | ICD-10-CM | POA: Insufficient documentation

## 2015-04-14 DIAGNOSIS — M5481 Occipital neuralgia: Secondary | ICD-10-CM

## 2015-04-14 DIAGNOSIS — M542 Cervicalgia: Secondary | ICD-10-CM | POA: Diagnosis present

## 2015-04-14 DIAGNOSIS — G43109 Migraine with aura, not intractable, without status migrainosus: Secondary | ICD-10-CM

## 2015-04-14 DIAGNOSIS — M5136 Other intervertebral disc degeneration, lumbar region: Secondary | ICD-10-CM

## 2015-04-14 DIAGNOSIS — M5134 Other intervertebral disc degeneration, thoracic region: Secondary | ICD-10-CM

## 2015-04-14 DIAGNOSIS — M5010 Cervical disc disorder with radiculopathy, unspecified cervical region: Secondary | ICD-10-CM

## 2015-04-14 MED ORDER — OXYCODONE-ACETAMINOPHEN 5-325 MG PO TABS: ORAL_TABLET | ORAL | Status: DC

## 2015-04-14 MED ORDER — LIDOCAINE HCL (PF) 1 % IJ SOLN
INTRAMUSCULAR | Status: AC
  Filled 2015-04-14: qty 5

## 2015-04-14 MED ORDER — TIZANIDINE HCL 2 MG PO CAPS: ORAL_CAPSULE | ORAL | Status: DC

## 2015-04-14 MED ORDER — TRIAMCINOLONE ACETONIDE 40 MG/ML IJ SUSP
40.0000 mg | Freq: Once | INTRAMUSCULAR | Status: AC
  Administered 2015-04-14: 40 mg

## 2015-04-14 MED ORDER — LACTATED RINGERS IV SOLN: 1000.0000 mL | INTRAVENOUS | Status: DC

## 2015-04-14 MED ORDER — TRIAMCINOLONE ACETONIDE 40 MG/ML IJ SUSP
INTRAMUSCULAR | Status: AC
  Administered 2015-04-14: 40 mg
  Filled 2015-04-14: qty 1

## 2015-04-14 MED ORDER — SODIUM CHLORIDE 0.9 % IJ SOLN
20.0000 mL | Freq: Once | INTRAMUSCULAR | Status: AC
  Administered 2015-04-14: 10 mL

## 2015-04-14 MED ORDER — MIDAZOLAM HCL 5 MG/5ML IJ SOLN
5.0000 mg | Freq: Once | INTRAMUSCULAR | Status: AC
  Administered 2015-04-14: 5 mg via INTRAVENOUS

## 2015-04-14 MED ORDER — MIDAZOLAM HCL 5 MG/5ML IJ SOLN
INTRAMUSCULAR | Status: AC
  Administered 2015-04-14: 5 mg via INTRAVENOUS
  Filled 2015-04-14: qty 5

## 2015-04-14 MED ORDER — LIDOCAINE HCL (PF) 1 % IJ SOLN: 10.0000 mL | Freq: Once | INTRAMUSCULAR | Status: DC

## 2015-04-14 MED ORDER — CEFAZOLIN SODIUM 1-5 GM-% IV SOLN: 1.0000 g | Freq: Once | INTRAVENOUS | Status: DC

## 2015-04-14 MED ORDER — FENTANYL CITRATE (PF) 100 MCG/2ML IJ SOLN
INTRAMUSCULAR | Status: AC
  Administered 2015-04-14: 100 ug via INTRAVENOUS
  Filled 2015-04-14: qty 2

## 2015-04-14 MED ORDER — BUPIVACAINE HCL (PF) 0.25 % IJ SOLN
INTRAMUSCULAR | Status: AC
  Administered 2015-04-14: 20 mL
  Filled 2015-04-14: qty 30

## 2015-04-14 MED ORDER — SODIUM CHLORIDE 0.9 % IJ SOLN
INTRAMUSCULAR | Status: AC
  Administered 2015-04-14: 10 mL
  Filled 2015-04-14: qty 20

## 2015-04-14 MED ORDER — ORPHENADRINE CITRATE ER 100 MG PO TB12
ORAL_TABLET | ORAL | Status: DC
Start: 2015-04-14 — End: 2015-04-14

## 2015-04-14 MED ORDER — CEFUROXIME AXETIL 250 MG PO TABS: 250.0000 mg | ORAL_TABLET | Freq: Two times a day (BID) | ORAL | Status: DC

## 2015-04-14 MED ORDER — CEFAZOLIN SODIUM 1 G IJ SOLR
INTRAMUSCULAR | Status: AC
  Administered 2015-04-14: 1 g
  Filled 2015-04-14: qty 10

## 2015-04-14 MED ORDER — ORPHENADRINE CITRATE 30 MG/ML IJ SOLN
INTRAMUSCULAR | Status: AC
  Administered 2015-04-14: 60 mg via INTRAMUSCULAR
  Filled 2015-04-14: qty 2

## 2015-04-14 MED ORDER — FENTANYL CITRATE (PF) 100 MCG/2ML IJ SOLN
100.0000 ug | Freq: Once | INTRAMUSCULAR | Status: AC
  Administered 2015-04-14: 100 ug via INTRAVENOUS

## 2015-04-14 MED ORDER — ORPHENADRINE CITRATE 30 MG/ML IJ SOLN
60.0000 mg | Freq: Once | INTRAMUSCULAR | Status: AC
  Administered 2015-04-14: 60 mg via INTRAMUSCULAR

## 2015-04-14 NOTE — Progress Notes (Signed)
Safety precautions to be maintained throughout the outpatient stay will include: orient to surroundings, keep bed in low position, maintain call bell within reach at all times, provide assistance with transfer out of bed and ambulation.  

## 2015-04-14 NOTE — Patient Instructions (Addendum)
   PLAN   Continue present medications oxycodone acetaminophen and   Zanaflex  NO NORFLEX.  Please begin Ceftin  Antibiotic today as prescibed  F/U PCP Dr Judithann GravesBerglund  for evaliation of  BP and general medical condition . Follow-up with Dr Judithann GravesBerglund  to discuss neurosurgical reevaluation as well  F/U surgical evaluation. Neurosurgical reevaluation as discussed  F/U neurological evaluation. Ask receptionist date of neurological evaluation of pain of cervical and upper extremity region and headaches  May consider radiofrequency rhizolysis or intraspinal procedures pending response to present treatment and F/U evaluation   Patient to call Pain Management Center should patient have concerns prior to scheduled return appointment. A prescription for Ceftin and Zanaflex was sent to your pharmacy. :You were given a prescription for Oxycodone today.

## 2015-04-14 NOTE — Progress Notes (Signed)
Subjective:    Patient ID: Theresa Murphy, female    DOB: September 23, 1975, 40 y.o.   MRN: [id number]  HPI  PROCEDURE PERFORMED: Cervical facet (medial branch block)   NOTE: The patient is a 40 y.o. female who returns to [facility] for further evaluation and treatment of pain involving the cervical region and upper extremity region with prior studies revealing the patient to be with MRI evidence of Degenerative disc disease of the cervical spine  Status post cervical fusion ACDF. The patient is a severe pain of the cervical region. The patient's pain has been felt to be due to degenerative changes cervical spine with concern regarding component of pain due to facet syndrome.. The risks, benefits, and expectations of the procedure have been discussed and explained to the patient who was understanding and in agreement with suggested treatment plan. We will proceed with interventional treatment as discussed and explained to the patient who was understanding and willing to proceed with proposed treatment plan.   DESCRIPTION OF PROCEDURE: Cervical facet (medial branch block) with IV Versed, IV fentanyl conscious sedation, EKG, blood pressure, pulse, and pulse oximetry monitoring. The procedure was performed with the patient in the lateral decubitus position under fluoroscopic guidance with EKG, blood pressure, pulse, and pulse oximetry monitoring.   Right C 3 cervical facet (medial branch block). With the patient in the lateral decubitus position a 22 -gauge needle was inserted at the C3 vertebral body level under fluoroscopic guidance with needle placed in the center of the articulating pillar  of C 3. vertebral body.  Following documentation of needle tip in the center of the articulating pillar of C 3, needle placement was then accomplished at the C for, C5, C6 vertebral body levels. After needle placement was accomplished at each level of the cervical spine under fluoroscopic guidance needle  placement was again verified with tip of needle documented to be in the center of the articulating pillars of C3, C4, C5, and C6.   Following negative aspiration for heme and CSF at each level, each level was injected with 1 mL of Preservative-Free normal saline with Kenalog.   Myoneural block injections of the cervical paraspinal musculature region Following Betadine prep of proposed entry site a 22-gauge needle was inserted in the cervical paraspinal musculature region and following negative aspiration 2 cc of 0.25% bupivacaine with Norflex was injected for myoneural block injection of the cervical region 4   The patient tolerated the procedure well. Needle removed. A total of 40 mg of Kenalog was utilized for the procedure.   PLAN:  1. Medications: The patient will continue presently prescribed medications. Zanaflex and oxycodone acetaminophen 2. Will consider modification of treatment regimen pending response to treatment rendered on today's visit and follow-up evaluation. 3. The patient is to follow-up with primary care physician Dr.Berglund  for evaluation of blood pressure and general medical condition following procedure performed on today's visit. 4. Surgical evaluation.Has been addressed  5. Neurological evaluationHas been addressed  May consider radiofrequency procedures, implantation device, and other treatment pending response to treatment and follow-up evaluation . 6. The patient has been advised to adhere to proper body mechanic and avoid activities which appear to aggravate condition. 7. The patient has been advised to call the Pain Management Center prior to scheduled return appointment should there be significant change in condition or should patient have other concerns regarding condition prior to scheduled return appointment.  The patient is understanding and in agreement with suggested treatment plan.  Review of Systems     Objective:   Physical Exam         Assessment & Plan:

## 2015-04-14 NOTE — Progress Notes (Signed)
   Subjective:    Patient ID: Theresa Murphy, female    DOB: 1975/10/30, 40 y.o.   MRN: [id number]  HPI    Review of Systems     Objective:   Physical Exam        Assessment & Plan:  .pms

## 2015-04-15 ENCOUNTER — Telehealth: Payer: Self-pay | Admitting: *Deleted

## 2015-04-15 NOTE — Telephone Encounter (Signed)
Spoke with patient verbalizes no c/o from procedure.

## 2015-05-10 ENCOUNTER — Other Ambulatory Visit: Payer: Self-pay | Admitting: Orthopedic Surgery

## 2015-05-10 DIAGNOSIS — M4322 Fusion of spine, cervical region: Secondary | ICD-10-CM

## 2015-05-11 ENCOUNTER — Ambulatory Visit: Payer: 59 | Attending: Pain Medicine | Admitting: Pain Medicine

## 2015-05-11 ENCOUNTER — Encounter: Payer: Self-pay | Admitting: Pain Medicine

## 2015-05-11 VITALS — BP 108/73 | HR 110 | Temp 97.7°F | Resp 18 | Ht 64.0 in | Wt 150.0 lb

## 2015-05-11 DIAGNOSIS — M7060 Trochanteric bursitis, unspecified hip: Secondary | ICD-10-CM | POA: Diagnosis not present

## 2015-05-11 DIAGNOSIS — M5030 Other cervical disc degeneration, unspecified cervical region: Secondary | ICD-10-CM | POA: Diagnosis not present

## 2015-05-11 DIAGNOSIS — M5412 Radiculopathy, cervical region: Secondary | ICD-10-CM | POA: Diagnosis not present

## 2015-05-11 DIAGNOSIS — Z981 Arthrodesis status: Secondary | ICD-10-CM | POA: Diagnosis not present

## 2015-05-11 DIAGNOSIS — M542 Cervicalgia: Secondary | ICD-10-CM | POA: Diagnosis present

## 2015-05-11 DIAGNOSIS — M533 Sacrococcygeal disorders, not elsewhere classified: Secondary | ICD-10-CM | POA: Diagnosis not present

## 2015-05-11 DIAGNOSIS — G43909 Migraine, unspecified, not intractable, without status migrainosus: Secondary | ICD-10-CM | POA: Insufficient documentation

## 2015-05-11 DIAGNOSIS — M5134 Other intervertebral disc degeneration, thoracic region: Secondary | ICD-10-CM

## 2015-05-11 DIAGNOSIS — M5136 Other intervertebral disc degeneration, lumbar region: Secondary | ICD-10-CM

## 2015-05-11 DIAGNOSIS — M5010 Cervical disc disorder with radiculopathy, unspecified cervical region: Secondary | ICD-10-CM

## 2015-05-11 DIAGNOSIS — M5481 Occipital neuralgia: Secondary | ICD-10-CM

## 2015-05-11 DIAGNOSIS — M47812 Spondylosis without myelopathy or radiculopathy, cervical region: Secondary | ICD-10-CM

## 2015-05-11 DIAGNOSIS — G43109 Migraine with aura, not intractable, without status migrainosus: Secondary | ICD-10-CM

## 2015-05-11 DIAGNOSIS — M546 Pain in thoracic spine: Secondary | ICD-10-CM | POA: Diagnosis present

## 2015-05-11 DIAGNOSIS — Q761 Klippel-Feil syndrome: Secondary | ICD-10-CM

## 2015-05-11 MED ORDER — TIZANIDINE HCL 2 MG PO CAPS: ORAL_CAPSULE | ORAL | Status: DC

## 2015-05-11 MED ORDER — OXYCODONE-ACETAMINOPHEN 5-325 MG PO TABS: ORAL_TABLET | ORAL | Status: DC

## 2015-05-11 NOTE — Patient Instructions (Addendum)
PLAN   Continue present medications Zanaflex and oxycodone acetaminophen  Cervical epidural steroid injection to be performed at time return appointment  F/U PCP Dr Theresa Murphy  for evaliation of  BP and general medical  condition  F/U surgical evaluation. Neurosurgical evaluation as scheduled  Ask receptionist the date of your neurosurgical evaluation  F/U neurological evaluation as planned with consideration for PNCV/EMG studies  May consider radiofrequency rhizolysis or intraspinal procedures pending response to present treatment and F/U evaluation   Patient to call [facility] Management Center should patient have concerns prior to scheduled return appointment.Pain Management Discharge Instructions  General Discharge Instructions :  If you need to reach your doctor call: Monday-Friday 8:00 am - 4:00 pm at [phone] or toll free [phone].  After clinic hours [phone] to have operator reach doctor.  Bring all of your medication bottles to all your appointments in the pain clinic.  To cancel or reschedule your appointment with Pain Management please remember to call 24 hours in advance to avoid a fee.  Refer to the educational materials which you have been given on: General Risks, I had my Procedure. Discharge Instructions, Post Sedation.  Post Procedure Instructions:  The drugs you were given will stay in your system until tomorrow, so for the next 24 hours you should not drive, make any legal decisions or drink any alcoholic beverages.  You may eat anything you prefer, but it is better to start with liquids then soups and crackers, and gradually work up to solid foods.  Please notify your doctor immediately if you have any unusual bleeding, trouble breathing or pain that is not related to your normal pain.  Depending on the type of procedure that was done, some parts of your body may feel week and/or numb.  This usually clears up by tonight or the next day.  Walk  with the use of an assistive device or accompanied by an adult for the 24 hours.  You may use ice on the affected area for the first 24 hours.  Put ice in a Ziploc bag and cover with a towel and place against area 15 minutes on 15 minutes off.  You may switch to heat after 24 hours.GENERAL RISKS AND COMPLICATIONS  What are the risk, side effects and possible complications? Generally speaking, most procedures are safe.  However, with any procedure there are risks, side effects, and the possibility of complications.  The risks and complications are dependent upon the sites that are lesioned, or the type of nerve block to be performed.  The closer the procedure is to the spine, the more serious the risks are.  Great care is taken when placing the radio frequency needles, block needles or lesioning probes, but sometimes complications can occur. 1. Infection: Any time there is an injection through the skin, there is a risk of infection.  This is why sterile conditions are used for these blocks.  There are four possible types of infection. 1. Localized skin infection. 2. Central Nervous System Infection-This can be in the form of Meningitis, which can be deadly. 3. Epidural Infections-This can be in the form of an epidural abscess, which can cause pressure inside of the spine, causing compression of the spinal cord with subsequent paralysis. This would require an emergency surgery to decompress, and there are no guarantees that the patient would recover from the paralysis. 4. Discitis-This is an infection of the intervertebral discs.  It occurs in about 1% of discography procedures.  It is difficult  to treat and it may lead to surgery.        2. Pain: the needles have to go through skin and soft tissues, will cause soreness.       3. Damage to internal structures:  The nerves to be lesioned may be near blood vessels or    other nerves which can be potentially damaged.       4. Bleeding: Bleeding is more  common if the patient is taking blood thinners such as  aspirin, Coumadin, Ticiid, Plavix, etc., or if he/she have some genetic predisposition  such as hemophilia. Bleeding into the spinal canal can cause compression of the spinal  cord with subsequent paralysis.  This would require an emergency surgery to  decompress and there are no guarantees that the patient would recover from the  paralysis.       5. Pneumothorax:  Puncturing of a lung is a possibility, every time a needle is introduced in  the area of the chest or upper back.  Pneumothorax refers to free air around the  collapsed lung(s), inside of the thoracic cavity (chest cavity).  Another two possible  complications related to a similar event would include: Hemothorax and Chylothorax.   These are variations of the Pneumothorax, where instead of air around the collapsed  lung(s), you may have blood or chyle, respectively.       6. Spinal headaches: They may occur with any procedures in the area of the spine.       7. Persistent CSF (Cerebro-Spinal Fluid) leakage: This is a rare problem, but may occur  with prolonged intrathecal or epidural catheters either due to the formation of a fistulous  track or a dural tear.       8. Nerve damage: By working so close to the spinal cord, there is always a possibility of  nerve damage, which could be as serious as a permanent spinal cord injury with  paralysis.       9. Death:  Although rare, severe deadly allergic reactions known as "Anaphylactic  reaction" can occur to any of the medications used.      10. Worsening of the symptoms:  We can always make thing worse.  What are the chances of something like this happening? Chances of any of this occuring are extremely low.  By statistics, you have more of a chance of getting killed in a motor vehicle accident: while driving to the hospital than any of the above occurring .  Nevertheless, you should be aware that they are possibilities.  In general, it is  similar to taking a shower.  Everybody knows that you can slip, hit your head and get killed.  Does that mean that you should not shower again?  Nevertheless always keep in mind that statistics do not mean anything if you happen to be on the wrong side of them.  Even if a procedure has a 1 (one) in a 1,000,000 (million) chance of going wrong, it you happen to be that one..Also, keep in mind that by statistics, you have more of a chance of having something go wrong when taking medications.  Who should not have this procedure? If you are on a blood thinning medication (e.g. Coumadin, Plavix, see list of "Blood Thinners"), or if you have an active infection going on, you should not have the procedure.  If you are taking any blood thinners, please inform your physician.  How should I prepare for this procedure?  Do not eat  or drink anything at least six hours prior to the procedure.  Bring a driver with you .  It cannot be a taxi.  Come accompanied by an adult that can drive you back, and that is strong enough to help you if your legs get weak or numb from the local anesthetic.  Take all of your medicines the morning of the procedure with just enough water to swallow them.  If you have diabetes, make sure that you are scheduled to have your procedure done first thing in the morning, whenever possible.  If you have diabetes, take only half of your insulin dose and notify our nurse that you have done so as soon as you arrive at the clinic.  If you are diabetic, but only take blood sugar pills (oral hypoglycemic), then do not take them on the morning of your procedure.  You may take them after you have had the procedure.  Do not take aspirin or any aspirin-containing medications, at least eleven (11) days prior to the procedure.  They may prolong bleeding.  Wear loose fitting clothing that may be easy to take off and that you would not mind if it got stained with Betadine or blood.  Do not wear any  jewelry or perfume  Remove any nail coloring.  It will interfere with some of our monitoring equipment.  NOTE: Remember that this is not meant to be interpreted as a complete list of all possible complications.  Unforeseen problems may occur.  BLOOD THINNERS The following drugs contain aspirin or other products, which can cause increased bleeding during surgery and should not be taken for 2 weeks prior to and 1 week after surgery.  If you should need take something for relief of minor pain, you may take acetaminophen which is found in Tylenol,m Datril, Anacin-3 and Panadol. It is not blood thinner. The products listed below are.  Do not take any of the products listed below in addition to any listed on your instruction sheet.  A.P.C or A.P.C with Codeine Codeine Phosphate Capsules #3 Ibuprofen Ridaura  ABC compound Congesprin Imuran rimadil  Advil Cope Indocin Robaxisal  Alka-Seltzer Effervescent Pain Reliever and Antacid Coricidin or Coricidin-D  Indomethacin Rufen  Alka-Seltzer plus Cold Medicine Cosprin Ketoprofen S-A-C Tablets  Anacin Analgesic Tablets or Capsules Coumadin Korlgesic Salflex  Anacin Extra Strength Analgesic tablets or capsules CP-2 Tablets Lanoril Salicylate  Anaprox Cuprimine Capsules Levenox Salocol  Anexsia-D Dalteparin Magan Salsalate  Anodynos Darvon compound Magnesium Salicylate Sine-off  Ansaid Dasin Capsules Magsal Sodium Salicylate  Anturane Depen Capsules Marnal Soma  APF Arthritis pain formula Dewitt's Pills Measurin Stanback  Argesic Dia-Gesic Meclofenamic Sulfinpyrazone  Arthritis Bayer Timed Release Aspirin Diclofenac Meclomen Sulindac  Arthritis pain formula Anacin Dicumarol Medipren Supac  Analgesic (Safety coated) Arthralgen Diffunasal Mefanamic Suprofen  Arthritis Strength Bufferin Dihydrocodeine Mepro Compound Suprol  Arthropan liquid Dopirydamole Methcarbomol with Aspirin Synalgos  ASA tablets/Enseals Disalcid Micrainin Tagament  Ascriptin Doan's  Midol Talwin  Ascriptin A/D Dolene Mobidin Tanderil  Ascriptin Extra Strength Dolobid Moblgesic Ticlid  Ascriptin with Codeine Doloprin or Doloprin with Codeine Momentum Tolectin  Asperbuf Duoprin Mono-gesic Trendar  Aspergum Duradyne Motrin or Motrin IB Triminicin  Aspirin plain, buffered or enteric coated Durasal Myochrisine Trigesic  Aspirin Suppositories Easprin Nalfon Trillsate  Aspirin with Codeine Ecotrin Regular or Extra Strength Naprosyn Uracel  Atromid-S Efficin Naproxen Ursinus  Auranofin Capsules Elmiron Neocylate Vanquish  Axotal Emagrin Norgesic Verin  Azathioprine Empirin or Empirin with Codeine Normiflo Vitamin E  Azolid Emprazil  Nuprin Voltaren  Bayer Aspirin plain, buffered or children's or timed BC Tablets or powders Encaprin Orgaran Warfarin Sodium  Buff-a-Comp Enoxaparin Orudis Zorpin  Buff-a-Comp with Codeine Equegesic Os-Cal-Gesic   Buffaprin Excedrin plain, buffered or Extra Strength Oxalid   Bufferin Arthritis Strength Feldene Oxphenbutazone   Bufferin plain or Extra Strength Feldene Capsules Oxycodone with Aspirin   Bufferin with Codeine Fenoprofen Fenoprofen Pabalate or Pabalate-SF   Buffets II Flogesic Panagesic   Buffinol plain or Extra Strength Florinal or Florinal with Codeine Panwarfarin   Buf-Tabs Flurbiprofen Penicillamine   Butalbital Compound Four-way cold tablets Penicillin   Butazolidin Fragmin Pepto-Bismol   Carbenicillin Geminisyn Percodan   Carna Arthritis Reliever Geopen Persantine   Carprofen Gold's salt Persistin   Chloramphenicol Goody's Phenylbutazone   Chloromycetin Haltrain Piroxlcam   Clmetidine heparin Plaquenil   Cllnoril Hyco-pap Ponstel   Clofibrate Hydroxy chloroquine Propoxyphen         Before stopping any of these medications, be sure to consult the physician who ordered them.  Some, such as Coumadin (Warfarin) are ordered to prevent or treat serious conditions such as "deep thrombosis", "pumonary embolisms", and other heart  problems.  The amount of time that you may need off of the medication may also vary with the medication and the reason for which you were taking it.  If you are taking any of these medications, please make sure you notify your pain physician before you undergo any procedures.         Epidural Steroid Injection An epidural steroid injection is given to relieve pain in your neck, back, or legs that is caused by the irritation or swelling of a nerve root. This procedure involves injecting a steroid and numbing medicine (anesthetic) into the epidural space. The epidural space is the space between the outer covering of your spinal cord and the bones that form your backbone (vertebra).  LET [facility] CARE PROVIDER KNOW ABOUT:  2. Any allergies you have. 3. All medicines you are taking, including vitamins, herbs, eye drops, creams, and over-the-counter medicines such as aspirin. 4. Previous problems you or members of your family have had with the use of anesthetics. 5. Any blood disorders or blood clotting disorders you have. 6. Previous surgeries you have had. 7. Medical conditions you have. RISKS AND COMPLICATIONS Generally, this is a safe procedure. However, as with any procedure, complications can occur. Possible complications of epidural steroid injection include:  Headache.  Bleeding.  Infection.  Allergic reaction to the medicines.  Damage to your nerves. The response to this procedure depends on the underlying cause of the pain and its duration. People who have long-term (chronic) pain are less likely to benefit from epidural steroids than are those people whose pain comes on strong and suddenly. BEFORE THE PROCEDURE   Ask your health care provider about changing or stopping your regular medicines. You may be advised to stop taking blood-thinning medicines a few days before the procedure.  You may be given medicines to reduce anxiety.  Arrange for someone to take you home  after the procedure. PROCEDURE   You will remain awake during the procedure. You may receive medicine to make you relaxed.  You will be asked to lie on your stomach.  The injection site will be cleaned.  The injection site will be numbed with a medicine (local anesthetic).  A needle will be injected through your skin into the epidural space.  Your health care provider will use an X-ray machine to ensure that the  steroid is delivered closest to the affected nerve. You may have minimal discomfort at this time.  Once the needle is in the right position, the local anesthetic and the steroid will be injected into the epidural space.  The needle will then be removed and a bandage will be applied to the injection site. AFTER THE PROCEDURE  12. You may be monitored for a short time before you go home. 13. You may feel weakness or numbness in your arm or leg, which disappears within hours. 14. You may be allowed to eat, drink, and take your regular medicine. 15. You may have soreness at the site of the injection.   This information is not intended to replace advice given to you by your health care provider. Make sure you discuss any questions you have with your health care provider.   Document Released: 07/04/2007 Document Revised: 11/27/2012 Document Reviewed: 09/13/2012 Elsevier Interactive Patient Education [employer].

## 2015-05-11 NOTE — Progress Notes (Signed)
   Subjective:    Patient ID: Theresa Murphy, female    DOB: Aug 19, 1975, 40 y.o.   MRN: [id number]  HPI The patient is a 40 year old female who returns to pain management for further evaluation and treatment of pain involving the neck upper extremity region predominantly. Patient with pain involving the mid and lower back region of lesser degree. Patient states she noted improvement with prior cervical facet, medial branch nerve, blocks. The patient admits to some pain and tingling sensations of the upper extremity especially the right upper extremity associated with weakness as well. We discussed patient's condition and will consider patient for cervical epidural steroid injection to be performed at time return appointment. We will also proceed with scheduling patient for surgical evaluation or further discussion of her condition and consideration modifications of treatment regimen. Patient denied any trauma change in events of daily living the cost change in symptomatology and was most willing to proceed with planned treatment regimen. We will continue presently prescribed medications and we'll schedule patient for cervical epidural steroid injection to be performed at time return appointment. The patient agreed to suggested treatment plan   Review of Systems     Objective:   Physical Exam  There was tenderness to palpation of the paraspinal misreading the cervical region cervical facet region palpation which be produced pain of moderate degree with moderate tenderness of the region of the cervical facet cervical paraspinal musculatures on the right as well as on the left. The patient was with decreased grip strength with Tinel and Phalen's maneuver reproducing minimal discomfort. There was tenderness along the trapezius levator scapula and rhomboid musculature region with moderate muscle spasms noted. The patient appeared to be with questionably positive Spurling's maneuver. Palpation of the  acromioclavicular and glenohumeral joint regions reproduce mild to moderate discomfort. Palpation over the thoracic facet thoracic paraspinal misreading was attends to palpation with crepitus of the thoracic region noted. Palpation over the lumbar paraspinal musculatures and lumbar facet region was with out significant increase of pain with palpation over the lumbar facet lumbar paraspinal musculature region. Palpation of the gluteal and piriformis musculature region was with mild to moderate discomfort. There was mild tenderness along the greater trochanteric region and iliotibial band region with no sensory deficit or dermatomal distribution detected. There was negative clonus negative Homans. DTRs were difficult to elicit patient had difficulty relaxing. Abdomen nontender with no costovertebral tenderness noted.      Assessment & Plan:    Bilateral occipital neuralgia   Migraine headache  Degenerative disc disease of the cervical spine  Status post cervical fusion ACDF  Cervical facet syndrome   Cervical radiculopathy  Sacroiliac joint dysfunction   Greater trochanteric bursitis      PLAN   Continue present medications Zanaflex and oxycodone acetaminophen  Cervical epidural steroid injection to be performed at time return appointment  F/U PCP Dr Judithann Graves  for evaliation of  BP and general medical  condition  F/U surgical evaluation. Neurosurgical evaluation as scheduled  Ask receptionist the date of your neurosurgical evaluation  F/U neurological evaluation as planned with consideration for PNCV/EMG studies  May consider radiofrequency rhizolysis or intraspinal procedures pending response to present treatment and F/U evaluation   Patient to call Pain Management Center should patient have concerns prior to scheduled return appointment.

## 2015-05-11 NOTE — Progress Notes (Signed)
Safety precautions to be maintained throughout the outpatient stay will include: orient to surroundings, keep bed in low position, maintain call bell within reach at all times, provide assistance with transfer out of bed and ambulation.  Pt got hit in nose from dog11/27/17- not sure if she broke it- really sore Pt is out of medication- took extra after procedure for pain

## 2015-05-26 ENCOUNTER — Ambulatory Visit: Payer: 59 | Attending: Pain Medicine | Admitting: Pain Medicine

## 2015-05-26 ENCOUNTER — Encounter: Payer: Self-pay | Admitting: Pain Medicine

## 2015-05-26 VITALS — BP 109/66 | HR 77 | Temp 97.0°F | Resp 15 | Ht 64.0 in | Wt 150.0 lb

## 2015-05-26 DIAGNOSIS — M5134 Other intervertebral disc degeneration, thoracic region: Secondary | ICD-10-CM

## 2015-05-26 DIAGNOSIS — M5030 Other cervical disc degeneration, unspecified cervical region: Secondary | ICD-10-CM | POA: Diagnosis not present

## 2015-05-26 DIAGNOSIS — G44221 Chronic tension-type headache, intractable: Secondary | ICD-10-CM | POA: Insufficient documentation

## 2015-05-26 DIAGNOSIS — M5136 Other intervertebral disc degeneration, lumbar region: Secondary | ICD-10-CM

## 2015-05-26 DIAGNOSIS — Z981 Arthrodesis status: Secondary | ICD-10-CM | POA: Diagnosis not present

## 2015-05-26 DIAGNOSIS — Q761 Klippel-Feil syndrome: Secondary | ICD-10-CM

## 2015-05-26 DIAGNOSIS — M5010 Cervical disc disorder with radiculopathy, unspecified cervical region: Secondary | ICD-10-CM

## 2015-05-26 DIAGNOSIS — M5481 Occipital neuralgia: Secondary | ICD-10-CM

## 2015-05-26 DIAGNOSIS — M542 Cervicalgia: Secondary | ICD-10-CM | POA: Diagnosis present

## 2015-05-26 DIAGNOSIS — G43109 Migraine with aura, not intractable, without status migrainosus: Secondary | ICD-10-CM

## 2015-05-26 DIAGNOSIS — M47812 Spondylosis without myelopathy or radiculopathy, cervical region: Secondary | ICD-10-CM

## 2015-05-26 MED ORDER — LACTATED RINGERS IV SOLN: 1000.0000 mL | INTRAVENOUS | Status: DC

## 2015-05-26 MED ORDER — BUPIVACAINE HCL (PF) 0.25 % IJ SOLN: 30.0000 mL | Freq: Once | INTRAMUSCULAR | Status: DC

## 2015-05-26 MED ORDER — TRIAMCINOLONE ACETONIDE 40 MG/ML IJ SUSP: 40.0000 mg | Freq: Once | INTRAMUSCULAR | Status: DC

## 2015-05-26 MED ORDER — FENTANYL CITRATE (PF) 100 MCG/2ML IJ SOLN
INTRAMUSCULAR | Status: AC
  Administered 2015-05-26: 100 ug via INTRAVENOUS
  Filled 2015-05-26: qty 2

## 2015-05-26 MED ORDER — BUPIVACAINE HCL (PF) 0.25 % IJ SOLN
INTRAMUSCULAR | Status: AC
  Administered 2015-05-26: 10:00:00
  Filled 2015-05-26: qty 30

## 2015-05-26 MED ORDER — FENTANYL CITRATE (PF) 100 MCG/2ML IJ SOLN: 100.0000 ug | Freq: Once | INTRAMUSCULAR | Status: DC

## 2015-05-26 MED ORDER — CEFAZOLIN SODIUM 1-5 GM-% IV SOLN: 1.0000 g | Freq: Once | INTRAVENOUS | Status: DC

## 2015-05-26 MED ORDER — MIDAZOLAM HCL 5 MG/5ML IJ SOLN
INTRAMUSCULAR | Status: AC
  Administered 2015-05-26: 5 mg via INTRAVENOUS
  Filled 2015-05-26: qty 5

## 2015-05-26 MED ORDER — LIDOCAINE HCL (PF) 1 % IJ SOLN
INTRAMUSCULAR | Status: AC
  Administered 2015-05-26: 10:00:00
  Filled 2015-05-26: qty 5

## 2015-05-26 MED ORDER — SODIUM CHLORIDE 0.9% FLUSH: 20.0000 mL | Freq: Once | INTRAVENOUS | Status: DC

## 2015-05-26 MED ORDER — CEFAZOLIN SODIUM 1 G IJ SOLR
INTRAMUSCULAR | Status: AC
  Administered 2015-05-26: 10:00:00
  Filled 2015-05-26: qty 10

## 2015-05-26 MED ORDER — ORPHENADRINE CITRATE 30 MG/ML IJ SOLN: 60.0000 mg | Freq: Once | INTRAMUSCULAR | Status: DC

## 2015-05-26 MED ORDER — MIDAZOLAM HCL 5 MG/5ML IJ SOLN: 5.0000 mg | Freq: Once | INTRAMUSCULAR | Status: DC

## 2015-05-26 MED ORDER — LIDOCAINE HCL (PF) 1 % IJ SOLN: 10.0000 mL | Freq: Once | INTRAMUSCULAR | Status: DC

## 2015-05-26 MED ORDER — TRIAMCINOLONE ACETONIDE 40 MG/ML IJ SUSP
INTRAMUSCULAR | Status: AC
  Administered 2015-05-26: 10:00:00
  Filled 2015-05-26: qty 1

## 2015-05-26 MED ORDER — SODIUM CHLORIDE 0.9 % IJ SOLN
INTRAMUSCULAR | Status: AC
  Administered 2015-05-26: 10 mL
  Filled 2015-05-26: qty 20

## 2015-05-26 MED ORDER — ORPHENADRINE CITRATE 30 MG/ML IJ SOLN
INTRAMUSCULAR | Status: AC
  Administered 2015-05-26: 10:00:00
  Filled 2015-05-26: qty 2

## 2015-05-26 NOTE — Progress Notes (Signed)
Safety precautions to be maintained throughout the outpatient stay will include: orient to surroundings, keep bed in low position, maintain call bell within reach at all times, provide assistance with transfer out of bed and ambulation.  

## 2015-05-26 NOTE — Patient Instructions (Addendum)
PLAN   Continue present medications oxycodone acetaminophen and   Zanaflex  NO NORFLEX.  Please get Ceftin antibiotic and begin taking Ceftin  Antibiotic today as prescibed  F/U PCP Dr Judithann Graves  for evaliation of  BP and general medical condition . Follow-up with Dr Judithann Graves  to discuss neurosurgical reevaluation as well  F/U surgical evaluation. Neurosurgical reevaluation as discussed  F/U neurological evaluation as planned  May consider radiofrequency rhizolysis or intraspinal procedures pending response to present treatment and F/U evaluation   Patient to call [facility] should patient have concerns prior to scheduled return appointment.GENERAL RISKS AND COMPLICATIONS  What are the risk, side effects and possible complications? Generally speaking, most procedures are safe.  However, with any procedure there are risks, side effects, and the possibility of complications.  The risks and complications are dependent upon the sites that are lesioned, or the type of nerve block to be performed.  The closer the procedure is to the spine, the more serious the risks are.  Great care is taken when placing the radio frequency needles, block needles or lesioning probes, but sometimes complications can occur. 1. Infection: Any time there is an injection through the skin, there is a risk of infection.  This is why sterile conditions are used for these blocks.  There are four possible types of infection. 1. Localized skin infection. 2. Central Nervous System Infection-This can be in the form of Meningitis, which can be deadly. 3. Epidural Infections-This can be in the form of an epidural abscess, which can cause pressure inside of the spine, causing compression of the spinal cord with subsequent paralysis. This would require an emergency surgery to decompress, and there are no guarantees that the patient would recover from the paralysis. 4. Discitis-This is an infection of the intervertebral  discs.  It occurs in about 1% of discography procedures.  It is difficult to treat and it may lead to surgery.        2. Pain: the needles have to go through skin and soft tissues, will cause soreness.       3. Damage to internal structures:  The nerves to be lesioned may be near blood vessels or    other nerves which can be potentially damaged.       4. Bleeding: Bleeding is more common if the patient is taking blood thinners such as  aspirin, Coumadin, Ticiid, Plavix, etc., or if he/she have some genetic predisposition  such as hemophilia. Bleeding into the spinal canal can cause compression of the spinal  cord with subsequent paralysis.  This would require an emergency surgery to  decompress and there are no guarantees that the patient would recover from the  paralysis.       5. Pneumothorax:  Puncturing of a lung is a possibility, every time a needle is introduced in  the area of the chest or upper back.  Pneumothorax refers to free air around the  collapsed lung(s), inside of the thoracic cavity (chest cavity).  Another two possible  complications related to a similar event would include: Hemothorax and Chylothorax.   These are variations of the Pneumothorax, where instead of air around the collapsed  lung(s), you may have blood or chyle, respectively.       6. Spinal headaches: They may occur with any procedures in the area of the spine.       7. Persistent CSF (Cerebro-Spinal Fluid) leakage: This is a rare problem, but may occur  with prolonged  intrathecal or epidural catheters either due to the formation of a fistulous  track or a dural tear.       8. Nerve damage: By working so close to the spinal cord, there is always a possibility of  nerve damage, which could be as serious as a permanent spinal cord injury with  paralysis.       9. Death:  Although rare, severe deadly allergic reactions known as "Anaphylactic  reaction" can occur to any of the medications used.      10. Worsening of the  symptoms:  We can always make thing worse.  What are the chances of something like this happening? Chances of any of this occuring are extremely low.  By statistics, you have more of a chance of getting killed in a motor vehicle accident: while driving to the hospital than any of the above occurring .  Nevertheless, you should be aware that they are possibilities.  In general, it is similar to taking a shower.  Everybody knows that you can slip, hit your head and get killed.  Does that mean that you should not shower again?  Nevertheless always keep in mind that statistics do not mean anything if you happen to be on the wrong side of them.  Even if a procedure has a 1 (one) in a 1,000,000 (million) chance of going wrong, it you happen to be that one..Also, keep in mind that by statistics, you have more of a chance of having something go wrong when taking medications.  Who should not have this procedure? If you are on a blood thinning medication (e.g. Coumadin, Plavix, see list of "Blood Thinners"), or if you have an active infection going on, you should not have the procedure.  If you are taking any blood thinners, please inform your physician.  How should I prepare for this procedure?  Do not eat or drink anything at least six hours prior to the procedure.  Bring a driver with you .  It cannot be a taxi.  Come accompanied by an adult that can drive you back, and that is strong enough to help you if your legs get weak or numb from the local anesthetic.  Take all of your medicines the morning of the procedure with just enough water to swallow them.  If you have diabetes, make sure that you are scheduled to have your procedure done first thing in the morning, whenever possible.  If you have diabetes, take only half of your insulin dose and notify our nurse that you have done so as soon as you arrive at the clinic.  If you are diabetic, but only take blood sugar pills (oral hypoglycemic), then do  not take them on the morning of your procedure.  You may take them after you have had the procedure.  Do not take aspirin or any aspirin-containing medications, at least eleven (11) days prior to the procedure.  They may prolong bleeding.  Wear loose fitting clothing that may be easy to take off and that you would not mind if it got stained with Betadine or blood.  Do not wear any jewelry or perfume  Remove any nail coloring.  It will interfere with some of our monitoring equipment.  NOTE: Remember that this is not meant to be interpreted as a complete list of all possible complications.  Unforeseen problems may occur.  BLOOD THINNERS The following drugs contain aspirin or other products, which can cause increased bleeding during surgery and should  not be taken for 2 weeks prior to and 1 week after surgery.  If you should need take something for relief of minor pain, you may take acetaminophen which is found in Tylenol,m Datril, Anacin-3 and Panadol. It is not blood thinner. The products listed below are.  Do not take any of the products listed below in addition to any listed on your instruction sheet.  A.P.C or A.P.C with Codeine Codeine Phosphate Capsules #3 Ibuprofen Ridaura  ABC compound Congesprin Imuran rimadil  Advil Cope Indocin Robaxisal  Alka-Seltzer Effervescent Pain Reliever and Antacid Coricidin or Coricidin-D  Indomethacin Rufen  Alka-Seltzer plus Cold Medicine Cosprin Ketoprofen S-A-C Tablets  Anacin Analgesic Tablets or Capsules Coumadin Korlgesic Salflex  Anacin Extra Strength Analgesic tablets or capsules CP-2 Tablets Lanoril Salicylate  Anaprox Cuprimine Capsules Levenox Salocol  Anexsia-D Dalteparin Magan Salsalate  Anodynos Darvon compound Magnesium Salicylate Sine-off  Ansaid Dasin Capsules Magsal Sodium Salicylate  Anturane Depen Capsules Marnal Soma  APF Arthritis pain formula Dewitt's Pills Measurin Stanback  Argesic Dia-Gesic Meclofenamic Sulfinpyrazone   Arthritis Bayer Timed Release Aspirin Diclofenac Meclomen Sulindac  Arthritis pain formula Anacin Dicumarol Medipren Supac  Analgesic (Safety coated) Arthralgen Diffunasal Mefanamic Suprofen  Arthritis Strength Bufferin Dihydrocodeine Mepro Compound Suprol  Arthropan liquid Dopirydamole Methcarbomol with Aspirin Synalgos  ASA tablets/Enseals Disalcid Micrainin Tagament  Ascriptin Doan's Midol Talwin  Ascriptin A/D Dolene Mobidin Tanderil  Ascriptin Extra Strength Dolobid Moblgesic Ticlid  Ascriptin with Codeine Doloprin or Doloprin with Codeine Momentum Tolectin  Asperbuf Duoprin Mono-gesic Trendar  Aspergum Duradyne Motrin or Motrin IB Triminicin  Aspirin plain, buffered or enteric coated Durasal Myochrisine Trigesic  Aspirin Suppositories Easprin Nalfon Trillsate  Aspirin with Codeine Ecotrin Regular or Extra Strength Naprosyn Uracel  Atromid-S Efficin Naproxen Ursinus  Auranofin Capsules Elmiron Neocylate Vanquish  Axotal Emagrin Norgesic Verin  Azathioprine Empirin or Empirin with Codeine Normiflo Vitamin E  Azolid Emprazil Nuprin Voltaren  Bayer Aspirin plain, buffered or children's or timed BC Tablets or powders Encaprin Orgaran Warfarin Sodium  Buff-a-Comp Enoxaparin Orudis Zorpin  Buff-a-Comp with Codeine Equegesic Os-Cal-Gesic   Buffaprin Excedrin plain, buffered or Extra Strength Oxalid   Bufferin Arthritis Strength Feldene Oxphenbutazone   Bufferin plain or Extra Strength Feldene Capsules Oxycodone with Aspirin   Bufferin with Codeine Fenoprofen Fenoprofen Pabalate or Pabalate-SF   Buffets II Flogesic Panagesic   Buffinol plain or Extra Strength Florinal or Florinal with Codeine Panwarfarin   Buf-Tabs Flurbiprofen Penicillamine   Butalbital Compound Four-way cold tablets Penicillin   Butazolidin Fragmin Pepto-Bismol   Carbenicillin Geminisyn Percodan   Carna Arthritis Reliever Geopen Persantine   Carprofen Gold's salt Persistin   Chloramphenicol Goody's Phenylbutazone    Chloromycetin Haltrain Piroxlcam   Clmetidine heparin Plaquenil   Cllnoril Hyco-pap Ponstel   Clofibrate Hydroxy chloroquine Propoxyphen         Before stopping any of these medications, be sure to consult the physician who ordered them.  Some, such as Coumadin (Warfarin) are ordered to prevent or treat serious conditions such as "deep thrombosis", "pumonary embolisms", and other heart problems.  The amount of time that you may need off of the medication may also vary with the medication and the reason for which you were taking it.  If you are taking any of these medications, please make sure you notify your pain physician before you undergo any procedures.

## 2015-05-26 NOTE — Progress Notes (Signed)
   Subjective:    Patient ID: Theresa Murphy, female    DOB: 1975/04/29, 40 y.o.   MRN: [id number]  HPI  PROCEDURE PERFORMED: Cervical epidural steroid injection   NOTE: The patient is a 40 year old female who returns to [facility] for further evaluation and prescription of pain involving the neck and upper extremity region. MRI have revealed the patient to be with evidence of degenerative disc disease of the cervical spine Status post cervical fusion ACDF The risks, benefits, and expectations of the procedure have been discussed and explained to the patient who was understanding and wished to proceed with interventional treatment as planned. Will proceed with interventional treatment as discussed and as explained to the patient.  DESCRIPTION OF PROCEDURE: Cervical epidural steroid injection with IV Versed, IV fentanyl conscious sedation, EKG, blood pressure, pulse, and pulse oximetry monitoring. The procedure was performed with the patient in the prone position under fluoroscopic guidance. With AP view of the cervical spine, the patient in the prone position, Betadine prep of proposed entry site was performed. Following local anesthetic skin wheal of 1.5% plain lidocaine of proposed needle entry site, 18-gauge Tuohy epidural needle inserted via hanging drop technique at the C 6 vertebral body level with needle placed at the left of the midline under fluoroscopic guidance. Following needle placement under fluoroscopic guidance, a total of 4 mL of Preservative-Free normal saline with 40 mg of Kenalog injected incrementally via cervical epidural placed needle. Needle removed. The patient tolerated the injection well.   Myoneural block injections of the cervical region Following Betadine prep of proposed entry site a 22-gauge needle was inserted in the cervical paraspinal musculature region and following negative aspiration 2 cc of 0.25% bupivacaine with Norflex was injected for myoneural  block injection of the cervical region times  Two.  The patient tolerated procedure well  A total of 40 mg of Kenalog was utilized for the entire procedure  PLAN:  1. Medications: Will continue presently prescribed medications Zanaflex and oxycodone acetaminophen 2. Will consider modification of treatment regimen at time of return appointment pending response to treatment rendered on today's visit.  3. The patient is to follow-up with primary care physician Dr. Judithann Graves for evaluation of blood pressure and general medical condition status post steroid injection performed on today's visit.  4. Surgical evaluation. Marland Kitchen Has been addressed 5.  Neurological evaluation. . May consider PNCV EMG studies and other studies 6. May consider radiofrequency procedures, implantation device, and other treatment pending response to treatment and follow-up evaluation.  7. The patient has been advised to adhere to proper body mechanics and avoid activities which appear to aggravate condition.  8. The patient has been advised to call the Pain Management Center prior to scheduled return appointment should there be significant change in condition or should patient have other concerns regarding condition prior to scheduled return appointment.  The patient is understanding and in agreement with suggested treatment plan.   Review of Systems     Objective:   Physical Exam        Assessment & Plan:

## 2015-05-27 ENCOUNTER — Telehealth: Payer: Self-pay | Admitting: *Deleted

## 2015-05-27 ENCOUNTER — Ambulatory Visit
Admission: RE | Admit: 2015-05-27 | Discharge: 2015-05-27 | Disposition: A | Discharge status: Home / Self Care | Payer: 59 | Source: Ambulatory Visit | Attending: Orthopedic Surgery | Admitting: Orthopedic Surgery

## 2015-05-27 DIAGNOSIS — M4322 Fusion of spine, cervical region: Secondary | ICD-10-CM

## 2015-05-27 DIAGNOSIS — M5412 Radiculopathy, cervical region: Secondary | ICD-10-CM | POA: Insufficient documentation

## 2015-05-27 DIAGNOSIS — Z981 Arthrodesis status: Secondary | ICD-10-CM | POA: Diagnosis not present

## 2015-05-27 NOTE — Telephone Encounter (Signed)
Spoke with patient, verbalizes no complications from procedure on yesterday.  States she does have pain in her L shoulder that started after the procedure.  She has had pain in both shoulders previously.  Instructed patient to use ice or heat for discomfort, 15 minutes on and 15 minutes off and that she may also use her pain medication as needed.

## 2015-05-31 ENCOUNTER — Other Ambulatory Visit: Payer: Self-pay | Admitting: Internal Medicine

## 2015-06-09 ENCOUNTER — Ambulatory Visit: Payer: 59 | Attending: Pain Medicine | Admitting: Pain Medicine

## 2015-06-09 ENCOUNTER — Encounter: Payer: Self-pay | Admitting: Pain Medicine

## 2015-06-09 VITALS — BP 129/75 | HR 112 | Temp 98.5°F | Resp 16 | Ht 64.0 in | Wt 150.0 lb

## 2015-06-09 DIAGNOSIS — M47812 Spondylosis without myelopathy or radiculopathy, cervical region: Secondary | ICD-10-CM

## 2015-06-09 DIAGNOSIS — Z981 Arthrodesis status: Secondary | ICD-10-CM | POA: Insufficient documentation

## 2015-06-09 DIAGNOSIS — G43909 Migraine, unspecified, not intractable, without status migrainosus: Secondary | ICD-10-CM | POA: Insufficient documentation

## 2015-06-09 DIAGNOSIS — M5481 Occipital neuralgia: Secondary | ICD-10-CM | POA: Insufficient documentation

## 2015-06-09 DIAGNOSIS — M542 Cervicalgia: Secondary | ICD-10-CM | POA: Diagnosis present

## 2015-06-09 DIAGNOSIS — R51 Headache: Secondary | ICD-10-CM | POA: Diagnosis present

## 2015-06-09 DIAGNOSIS — M533 Sacrococcygeal disorders, not elsewhere classified: Secondary | ICD-10-CM | POA: Insufficient documentation

## 2015-06-09 DIAGNOSIS — M5010 Cervical disc disorder with radiculopathy, unspecified cervical region: Secondary | ICD-10-CM | POA: Diagnosis not present

## 2015-06-09 DIAGNOSIS — M549 Dorsalgia, unspecified: Secondary | ICD-10-CM | POA: Diagnosis present

## 2015-06-09 DIAGNOSIS — M5030 Other cervical disc degeneration, unspecified cervical region: Secondary | ICD-10-CM

## 2015-06-09 DIAGNOSIS — M5136 Other intervertebral disc degeneration, lumbar region: Secondary | ICD-10-CM

## 2015-06-09 DIAGNOSIS — G43109 Migraine with aura, not intractable, without status migrainosus: Secondary | ICD-10-CM

## 2015-06-09 DIAGNOSIS — Q761 Klippel-Feil syndrome: Secondary | ICD-10-CM

## 2015-06-09 DIAGNOSIS — M5134 Other intervertebral disc degeneration, thoracic region: Secondary | ICD-10-CM

## 2015-06-09 MED ORDER — OXYCODONE-ACETAMINOPHEN 5-325 MG PO TABS: ORAL_TABLET | ORAL | Status: DC

## 2015-06-09 MED ORDER — TIZANIDINE HCL 2 MG PO CAPS: ORAL_CAPSULE | ORAL | Status: DC

## 2015-06-09 NOTE — Progress Notes (Signed)
   Subjective:    Patient ID: Theresa Murphy, female    DOB: 1975-06-07, 40 y.o.   MRN: [id number]  HPI  The patient is a 40 year old female who returns to pain management for further evaluation and treatment of pain consisting of headaches with pain radiating from the neck to the back of the hip precipitating headaches of significant degree. The patient is status post surgery of the cervical region including cervical fusion. The patient denies any trauma change in events of daily living because change in symptomatology. The patient states that she has had significant relief of pain following previous treatment performed in pain management center. We will proceed with greater occipital nerve block at time return appointment in attempt to decrease severity and frequency of patient's headaches as well as avoid the need for more involved treatment and prevent progression of patient's condition. The patient was with understanding and in agreement with suggested treatment plan.The patient will continue Zanaflex and oxycodone acetaminophen as prescribed. The patient is tolerating medications well Review of Systems     Objective:   Physical Exam  There was moderate to moderately severe tenderness to palpation of the splenius capitis and occipitalis musculature regions. Palpation of these regions reproduced pain on the left as well as on the right of moderately severe degree. There was tenderness over the cervical facet cervical paraspinal musculature region on the left as well as on the right with tenderness over the trapezius levator scapula and rhomboid musculature regions of moderate degree severe degree. Palpation of the acromioclavicular and glenohumeral joint regions were without increased pain of significant degree. The patient appeared to be with decreased grip strength with Tinel and Phalen's maneuver reproducing minimal discomfort. Palpation over the thoracic facet thoracic paraspinal musculature  region was attends to palpation of moderate degree with no crepitus of the thoracic region noted. Palpation over the lumbar paraspinal musculature region lumbar facet region was with mild to moderate discomfort with lateral bending rotation extension and palpation of the lumbar facets reproducing mild to moderate discomfort. Palpation over the greater trochanteric region iliotibial band region was without increased pain of significant degree. There was moderate tenderness of the PSIS and PII S region. Straight leg raising was tolerates appointment 30 without increased pain with dorsiflexion noted. There was negative clonus negative Homans with no sensory deficit or dermatomal dystrophy detected. Abdomen was nontender with no costovertebral tenderness noted       Assessment & Plan:    Bilateral occipital neuralgia   Migraine headache  Degenerative disc disease of the cervical spine  Status post cervical fusion ACDF  Cervical facet syndrome   Cervical radiculopathy  Sacroiliac joint dysfunction        PLAN   Continue present medications Zanaflex and oxycodone acetaminophen  Greater occipital nerve block to be performed at time return appointment  F/U PCP Dr Judithann Graves  for evaliation of  BP and general medical  condition  F/U surgical evaluation. Neurosurgical evaluation as scheduled  Ask receptionist the date of your neurosurgical evaluation  F/U neurological evaluation as planned with consideration for PNCV/EMG studies  May consider radiofrequency rhizolysis or intraspinal procedures pending response to present treatment and F/U evaluation   Patient to call [facility] Management Center should patient have concerns prior to scheduled return appointment.

## 2015-06-09 NOTE — Patient Instructions (Addendum)
PLAN   Continue present medications Zanaflex and oxycodone acetaminophen  Greater occipital nerve block to be performed at time return appointment  F/U PCP Dr Judithann Graves  for evaliation of  BP and general medical  condition  F/U surgical evaluation. Neurosurgical evaluation as scheduled  Ask receptionist the date of your neurosurgical evaluation  F/U neurological evaluation as planned with consideration for PNCV/EMG studies  May consider radiofrequency rhizolysis or intraspinal procedures pending response to present treatment and F/U evaluation   Patient to call [facility] Center should patient have concerns prior to scheduled return appointment. You were given a prescription for Percocet today. Occipital Nerve Block Patient Information  Description: The occipital nerves originate in the cervical (neck) spinal cord and travel upward through muscle and tissue to supply sensation to the back of the head and top of the scalp.  In addition, the nerves control some of the muscles of the scalp.  Occipital neuralgia is an irritation of these nerves which can cause headaches, numbness of the scalp, and neck discomfort.     The occipital nerve block will interrupt nerve transmission through these nerves and can relieve pain and spasm.  The block consists of insertion of a small needle under the skin in the back of the head to deposit local anesthetic (numbing medicine) and/or steroids around the nerve.  The entire block usually lasts less than 5 minutes.  Conditions which may be treated by occipital blocks:   Muscular pain and spasm of the scalp  Nerve irritation, back of the head  Headaches  Upper neck pain  Preparation for the injection:  1. Do not eat any solid food or dairy products within 6 hours of your appointment. 2. You may drink clear liquids up to 2 hours before appointment.  Clear liquids include water, black coffee, juice or soda.  No milk or cream please. 3. You  may take your regular medication, including pain medications, with a sip of water before you appointment.  Diabetics should hold regular insulin (if taken separately) and take 1/2 normal NPH dose the morning of the procedure.  Carry some sugar containing items with you to your appointment. 4. A driver must accompany you and be prepared to drive you home after your procedure. 5. Bring all your current medications with you. 6. An IV may be inserted and sedation may be given at the discretion of the physician. 7. A blood pressure cuff, EKG, and other monitors will often be applied during the procedure.  Some patients may need to have extra oxygen administered for a short period. 8. You will be asked to provide medical information, including your allergies and medications, prior to the procedure.  We must know immediately if you are taking blood thinners (like Coumadin/Warfarin) or if you are allergic to IV iodine contrast (dye).  We must know if you could possible be pregnant.  9. Do not wear a high collared shirt or turtleneck.  Tie long hair up in the back if possible.  Possible side-effects:   Bleeding from needle site  Infection (rare, may require surgery)  Nerve injury (rare)  Hair on back of neck can be tinged with iodine scrub (this will wash out)  Light-headedness (temporary)  Pain at injection site (several days)  Decreased blood pressure (rare, temporary)  Seizure (very rare)  Call if you experience:   Hives or difficulty breathing ( go to the emergency room)  Inflammation or drainage at the injection site(s)  Please note:  Although  the local anesthetic injected can often make your painful muscles or headache feel good for several hours after the injection, the pain may return.  It takes 3-7 days for steroids to work.  You may not notice any pain relief for at least one week.  If effective, we will often do a series of injections spaced 3-6 weeks apart to maximally  decrease your pain.  If you have any questions, please call [phone] [facility]

## 2015-06-09 NOTE — Progress Notes (Signed)
Safety precautions to be maintained throughout the outpatient stay will include: orient to surroundings, keep bed in low position, maintain call bell within reach at all times, provide assistance with transfer out of bed and ambulation.  

## 2015-06-21 ENCOUNTER — Ambulatory Visit: Payer: 59 | Admitting: Pain Medicine

## 2015-06-22 ENCOUNTER — Other Ambulatory Visit: Payer: Self-pay | Admitting: Pain Medicine

## 2015-06-28 ENCOUNTER — Encounter: Payer: Self-pay | Admitting: Pain Medicine

## 2015-06-28 ENCOUNTER — Ambulatory Visit: Payer: 59 | Attending: Pain Medicine | Admitting: Pain Medicine

## 2015-06-28 VITALS — BP 111/78 | HR 90 | Temp 98.5°F | Resp 16 | Ht 64.0 in | Wt 150.0 lb

## 2015-06-28 DIAGNOSIS — M5030 Other cervical disc degeneration, unspecified cervical region: Secondary | ICD-10-CM

## 2015-06-28 DIAGNOSIS — M542 Cervicalgia: Secondary | ICD-10-CM | POA: Diagnosis present

## 2015-06-28 DIAGNOSIS — M5136 Other intervertebral disc degeneration, lumbar region: Secondary | ICD-10-CM

## 2015-06-28 DIAGNOSIS — M5010 Cervical disc disorder with radiculopathy, unspecified cervical region: Secondary | ICD-10-CM

## 2015-06-28 DIAGNOSIS — Z981 Arthrodesis status: Secondary | ICD-10-CM | POA: Diagnosis not present

## 2015-06-28 DIAGNOSIS — R51 Headache: Secondary | ICD-10-CM | POA: Insufficient documentation

## 2015-06-28 DIAGNOSIS — M5134 Other intervertebral disc degeneration, thoracic region: Secondary | ICD-10-CM

## 2015-06-28 DIAGNOSIS — M51369 Other intervertebral disc degeneration, lumbar region without mention of lumbar back pain or lower extremity pain: Secondary | ICD-10-CM

## 2015-06-28 DIAGNOSIS — Q761 Klippel-Feil syndrome: Secondary | ICD-10-CM

## 2015-06-28 DIAGNOSIS — M5481 Occipital neuralgia: Secondary | ICD-10-CM

## 2015-06-28 DIAGNOSIS — M47812 Spondylosis without myelopathy or radiculopathy, cervical region: Secondary | ICD-10-CM

## 2015-06-28 DIAGNOSIS — G43109 Migraine with aura, not intractable, without status migrainosus: Secondary | ICD-10-CM

## 2015-06-28 MED ORDER — TRIAMCINOLONE ACETONIDE 40 MG/ML IJ SUSP
INTRAMUSCULAR | Status: AC
  Filled 2015-06-28: qty 1

## 2015-06-28 MED ORDER — TRIAMCINOLONE ACETONIDE 40 MG/ML IJ SUSP: 40.0000 mg | Freq: Once | INTRAMUSCULAR | Status: DC

## 2015-06-28 MED ORDER — BUPIVACAINE HCL (PF) 0.25 % IJ SOLN
INTRAMUSCULAR | Status: AC
  Filled 2015-06-28: qty 30

## 2015-06-28 MED ORDER — BUPIVACAINE HCL (PF) 0.25 % IJ SOLN: 30.0000 mL | Freq: Once | INTRAMUSCULAR | Status: DC

## 2015-06-28 MED ORDER — ORPHENADRINE CITRATE 30 MG/ML IJ SOLN: 60.0000 mg | Freq: Once | INTRAMUSCULAR | Status: DC

## 2015-06-28 NOTE — Patient Instructions (Addendum)
    PLAN   Continue present medications Zanaflex and oxycodone acetaminophen  F/U PCP Dr Judithann GravesBerglund  for evaliation of  BP and general medical  condition  F/U surgical evaluation. Neurosurgical evaluation as scheduled  Ask receptionist the date of your neurosurgical evaluation  F/U neurological evaluation as planned with consideration for PNCV/EMG studies  May consider radiofrequency rhizolysis or intraspinal procedures pending response to present treatment and F/U evaluation   Patient to call Pain Management Center should patient have concerns prior to scheduled return appointment. Pain Management Discharge Instructions  General Discharge Instructions :  If you need to reach your doctor call: Monday-Friday 8:00 am - 4:00 pm at [phone] or toll free [phone].  After clinic hours [phone] to have operator reach doctor.  Bring all of your medication bottles to all your appointments in the pain clinic.  To cancel or reschedule your appointment with Pain Management please remember to call 24 hours in advance to avoid a fee.  Refer to the educational materials which you have been given on: General Risks, I had my Procedure. Discharge Instructions, Post Sedation.  Post Procedure Instructions:  The drugs you were given will stay in your system until tomorrow, so for the next 24 hours you should not drive, make any legal decisions or drink any alcoholic beverages.  You may eat anything you prefer, but it is better to start with liquids then soups and crackers, and gradually work up to solid foods.  Please notify your doctor immediately if you have any unusual bleeding, trouble breathing or pain that is not related to your normal pain.  Depending on the type of procedure that was done, some parts of your body may feel week and/or numb.  This usually clears up by tonight or the next day.  Walk with the use of an assistive device or accompanied by an adult for the 24  hours.  You may use ice on the affected area for the first 24 hours.  Put ice in a Ziploc bag and cover with a towel and place against area 15 minutes on 15 minutes off.  You may switch to heat after 24 hours.

## 2015-06-28 NOTE — Progress Notes (Signed)
   Subjective:    Patient ID: Wonda Ceriseharlotte Winterton, female    DOB: 05-03-1975, 40 y.o.   MRN: [id number]  HPI  NOTE: The patient is a 40 y.o.-year-old female who returns to the [facility] Management Center for further evaluation and treatment of pain consisting of pain involving the region of the neck and headache.  Patient is with prior studies revealing patient to be with Degenerative disc disease of the cervical spine  Status post cervical fusion ACDF. The patient is with headaches with pain radiating from the neck to the back of the head with concern regarding significant component of pain being due to bilateral occipital neuralgia in addition to migraine headache .  The risks, benefits, and expectations of the procedure have been discussed and explained to patient, who is understanding and wishes to proceed with interventional treatment as discussed and as explained to patient.  Will proceed with greater occipital nerve blocks with myoneural block injections at this time as discussed and as explained to patient.  All are understanding and in agreement with suggested treatment plan.    PROCEDURE:  Greater occipital nerve block on the left side with  EKG, blood pressure, pulse, pulse oximetry monitoring.  Procedure performed with patient in prone position.  Greater occipital nerve block on the left side.   With patient in prone position, Betadine prep of proposed entry site accomplished.  Following identification of the nuchal ridge, 22 -gauge needle was inserted at the level of the nuchal ridge medial to the occipital artery.  Following negative aspiration, 4cc 0.25% bupivacaine with Kenalog injected for left greater occipital nerve block.  Needle was removed.  Patient tolerated injection well.   Greater occipital nerve block on the rightt side. The greater occipital nerve block on the right side was performed exactly as the left greater occipital nerve block was performed and utilizing the same  technique.   A total of 10 mg Kenalog was utilized for the entire procedure.  PLAN:    1. Medications: Will continue presently prescribed medications Zanaflex and oxycodone acetaminophen at this time. 2. Patient to follow up with primary care physician  Dr. Vito BergerBerglund Bor evaluation of blood pressure and general medical condition status post procedure performed on today's visit. 3. Neurological evaluation for further assessment of headaches for further studies as discussed. 4. Surgical evaluation as discussed.  5. Patient may be candidate for Botox injections, radiofrequency procedures, as well as implantation type procedures pending response to treatment rendered on today's visit and pending follow-up evaluation. 6. Patient has been advised to adhere to proper body mechanics and to avoid activities which appear to aggravate condition.cations:  Will continue presently prescribed medications at this time. The patient is understanding and in agreement with the suggested treatment plan.   Review of Systems     Objective:   Physical Exam        Assessment & Plan:

## 2015-06-28 NOTE — Progress Notes (Signed)
Safety precautions to be maintained throughout the outpatient stay will include: orient to surroundings, keep bed in low position, maintain call bell within reach at all times, provide assistance with transfer out of bed and ambulation.  

## 2015-06-29 ENCOUNTER — Telehealth: Payer: Self-pay | Admitting: *Deleted

## 2015-06-29 NOTE — Telephone Encounter (Signed)
Patient denies any questions or concerns re; procedure on yesterday. 

## 2015-07-07 ENCOUNTER — Other Ambulatory Visit: Payer: Self-pay | Admitting: Neurosurgery

## 2015-07-08 ENCOUNTER — Encounter: Payer: Self-pay | Admitting: Pain Medicine

## 2015-07-08 ENCOUNTER — Ambulatory Visit: Payer: 59 | Attending: Pain Medicine | Admitting: Pain Medicine

## 2015-07-08 VITALS — BP 113/69 | HR 95 | Temp 98.3°F | Resp 16 | Ht 64.0 in | Wt 155.0 lb

## 2015-07-08 DIAGNOSIS — Z981 Arthrodesis status: Secondary | ICD-10-CM | POA: Diagnosis not present

## 2015-07-08 DIAGNOSIS — G43909 Migraine, unspecified, not intractable, without status migrainosus: Secondary | ICD-10-CM | POA: Insufficient documentation

## 2015-07-08 DIAGNOSIS — G43109 Migraine with aura, not intractable, without status migrainosus: Secondary | ICD-10-CM

## 2015-07-08 DIAGNOSIS — M5030 Other cervical disc degeneration, unspecified cervical region: Secondary | ICD-10-CM

## 2015-07-08 DIAGNOSIS — R51 Headache: Secondary | ICD-10-CM | POA: Diagnosis present

## 2015-07-08 DIAGNOSIS — M47812 Spondylosis without myelopathy or radiculopathy, cervical region: Secondary | ICD-10-CM

## 2015-07-08 DIAGNOSIS — M5010 Cervical disc disorder with radiculopathy, unspecified cervical region: Secondary | ICD-10-CM | POA: Insufficient documentation

## 2015-07-08 DIAGNOSIS — Q761 Klippel-Feil syndrome: Secondary | ICD-10-CM

## 2015-07-08 DIAGNOSIS — M533 Sacrococcygeal disorders, not elsewhere classified: Secondary | ICD-10-CM | POA: Insufficient documentation

## 2015-07-08 DIAGNOSIS — M5136 Other intervertebral disc degeneration, lumbar region: Secondary | ICD-10-CM

## 2015-07-08 DIAGNOSIS — M5134 Other intervertebral disc degeneration, thoracic region: Secondary | ICD-10-CM

## 2015-07-08 DIAGNOSIS — M5481 Occipital neuralgia: Secondary | ICD-10-CM | POA: Insufficient documentation

## 2015-07-08 DIAGNOSIS — M542 Cervicalgia: Secondary | ICD-10-CM | POA: Diagnosis present

## 2015-07-08 MED ORDER — OXYCODONE-ACETAMINOPHEN 5-325 MG PO TABS: ORAL_TABLET | ORAL | Status: DC

## 2015-07-08 MED ORDER — TIZANIDINE HCL 2 MG PO CAPS: ORAL_CAPSULE | ORAL | Status: DC

## 2015-07-08 NOTE — Patient Instructions (Addendum)
   PLAN   Continue present medications Zanaflex and oxycodone acetaminophen  Sphenopalatine ganglion block to be performed at time of return appointment  F/U PCP Dr Judithann GravesBerglund  for evaliation of  BP and general medical  condition  F/U surgical evaluation. Follow-up with neurosurgeon for further assessment of condition as discussed. Follow-up Dr.Durmick as discussed   Ask receptionist the date of your neurosurgical evaluation  F/U neurological evaluation as planned with consideration for PNCV/EMG studies  May consider radiofrequency rhizolysis or intraspinal procedures pending response to present treatment and F/U evaluation   Patient to call Pain Management Center should patient have concerns prior to scheduled return appointment.

## 2015-07-08 NOTE — Progress Notes (Signed)
Subjective:    Patient ID: Theresa Murphy, female    DOB: 1975-08-06, 40 y.o.   MRN: [id number]  HPI  The patient is a 40-year-old female who returns to pain management for further evaluation and treatment of pain occurring in the region of the neck associated with headaches as well as pain of the upper mid lower back lower extremity region. The patient is undergone further evaluation of pain involving the neck as well as headaches at the present time patient states that she wishes to continue treatment in pain management. The patient stated that she did discussed surgical intervention with surgeon and that she wishes to proceed with pain management prior to proceeding with additional surgery at this time. We discussed patient's condition at the present time the patient continues medications consisting of Zanaflex and oxycodone acetaminophen. The patient states that she has had significant improvement of her pain with prior interventional treatment. At the present time patient states that her headaches appeared to be with a migraine component which appears to be the most significant component of her headaches. We discussed patient's condition and will consider patient for sphenopalatine ganglion block to be performed at time return appointment in attempt to decrease severity of patient's symptoms, minimize progression of patient's symptoms, and avoid the need for more involved treatment. All agreed to suggested treatment plan.     Review of Systems     Objective:   Physical Exam  There was tenderness to palpation of the splenius capitis and occipitalis musketry region a moderate degree. Patient was attends to palpation of the cervical facet cervical paraspinal musculature region a moderate degree. Palpation of the acromial clavicular and glenohumeral joint regions were without increase of pain of significant degree. There was limited range of motion of the cervical spine with increase of pain  with palpation over the cervical facet cervical paraspinal musculature region. The patient appeared to be with decreased grip strength and Tinel and Phalen's maneuver without increase of pain of significant degree. Palpation over the thoracic facet thoracic paraspinal musculature region was with evidence of muscle spasms of the upper mid lower thoracic region of mild to moderate degree. There was no crepitus of the thoracic region noted. Palpation over the region of the sinuses were without increase of pain of significant degree. There were no bounding pulsations of the temporal region noted. Palpation over the lumbar paraspinal musculatures and lumbar facet region was attends to palpation of mild to moderate degree with lateral bending rotation extension and palpation of the lumbar facets reproducing mild to moderate discomfort. Straight leg raising was tolerates approximately 30 without an increase of pain with dorsiflexion noted. There was negative clonus negative Homans. No sensory deficit or dermatomal distribution detected of the upper extremities of the lower extremities. The abdomen was nontender and no costovertebral tenderness was noted.      Assessment & Plan:    Bilateral occipital neuralgia   Migraine headache  Degenerative disc disease of the cervical spine  Status post cervical fusion ACDF  Cervical facet syndrome   Cervical radiculopathy  Sacroiliac joint dysfunction       PLAN   Continue present medications Zanaflex and oxycodone acetaminophen  Sphenopalatine ganglion block to be performed at time of return appointment  F/U PCP Theresa Murphy  for evaliation of  BP and general medical  condition  F/U surgical evaluation. Follow-up with neurosurgeon for further assessment of condition as discussed. Follow-up Theresa Murphy as discussed   Ask receptionist the date of your neurosurgical  evaluation  F/U neurological evaluation as planned with consideration for PNCV/EMG  studies  May consider radiofrequency rhizolysis or intraspinal procedures pending response to present treatment and F/U evaluation   Patient to call Pain Management Center should patient have concerns prior to scheduled return appointment.

## 2015-07-08 NOTE — Progress Notes (Signed)
Safety precautions to be maintained throughout the outpatient stay will include: orient to surroundings, keep bed in low position, maintain call bell within reach at all times, provide assistance with transfer out of bed and ambulation.  

## 2015-07-09 ENCOUNTER — Other Ambulatory Visit: Payer: Self-pay | Admitting: Neurosurgery

## 2015-07-09 DIAGNOSIS — M5412 Radiculopathy, cervical region: Secondary | ICD-10-CM

## 2015-07-12 ENCOUNTER — Telehealth: Payer: Self-pay | Admitting: Pain Medicine

## 2015-07-12 ENCOUNTER — Ambulatory Visit: Payer: 59 | Admitting: Pain Medicine

## 2015-07-12 ENCOUNTER — Other Ambulatory Visit: Payer: Self-pay | Admitting: Neurosurgery

## 2015-07-12 DIAGNOSIS — M5412 Radiculopathy, cervical region: Secondary | ICD-10-CM

## 2015-07-12 NOTE — Telephone Encounter (Signed)
Please reschedule patient for Wednesday, 07/14/2015 same time

## 2015-07-12 NOTE — Telephone Encounter (Signed)
Patient left vmail on Mon 07-12-15 at 7:43 stating she is sick and unable to come in for procedure

## 2015-07-14 ENCOUNTER — Encounter: Payer: Self-pay | Admitting: Pain Medicine

## 2015-07-14 ENCOUNTER — Ambulatory Visit: Payer: 59 | Attending: Pain Medicine | Admitting: Pain Medicine

## 2015-07-14 VITALS — BP 118/79 | HR 82 | Temp 97.8°F | Resp 16 | Wt 153.0 lb

## 2015-07-14 DIAGNOSIS — R51 Headache: Secondary | ICD-10-CM | POA: Insufficient documentation

## 2015-07-14 DIAGNOSIS — M5134 Other intervertebral disc degeneration, thoracic region: Secondary | ICD-10-CM

## 2015-07-14 DIAGNOSIS — M5136 Other intervertebral disc degeneration, lumbar region: Secondary | ICD-10-CM

## 2015-07-14 DIAGNOSIS — M5010 Cervical disc disorder with radiculopathy, unspecified cervical region: Secondary | ICD-10-CM

## 2015-07-14 DIAGNOSIS — M5030 Other cervical disc degeneration, unspecified cervical region: Secondary | ICD-10-CM

## 2015-07-14 DIAGNOSIS — G43109 Migraine with aura, not intractable, without status migrainosus: Secondary | ICD-10-CM

## 2015-07-14 DIAGNOSIS — M5481 Occipital neuralgia: Secondary | ICD-10-CM

## 2015-07-14 DIAGNOSIS — M47812 Spondylosis without myelopathy or radiculopathy, cervical region: Secondary | ICD-10-CM

## 2015-07-14 DIAGNOSIS — Q761 Klippel-Feil syndrome: Secondary | ICD-10-CM

## 2015-07-14 MED ORDER — LIDOCAINE HCL 4 % EX SOLN
CUTANEOUS | Status: AC
  Filled 2015-07-14: qty 50

## 2015-07-14 NOTE — Progress Notes (Signed)
   Subjective:    Patient ID: Theresa Murphy, female    DOB: 1975/05/19, 40 y.o.   MRN: [id number]  HPI                                                                                                                SPHENOPALATINE GANGLION BLOCK                                                                        The patient is a 40 -year-old female who comes to pain management for follow-up evaluation and treatment of headache. The patient has been felt to be with significant component of headaches due to migraine. Decision has been made to proceed with sphenopalatine ganglion block in attempt to decrease severity of patient's headaches, reduce the frequency of patient's headaches, and avoid the need for more extensive treatment. The risks, benefits, and expectations of the procedure have been discussed with patient and explained to patient and all are in agreement to proceed with sphenopalatine ganglion block as planned   Description Of Procedure:  Sphenopalatine Ganglion Block  The patient assumed the supine position with head hyperextended. EKG, blood pressure, pulse, and pulse oximetry monitors were all in place. Next 1 cc of 4% topical lidocaine was instilled in the right nostril followed by 1 cc of 4% lidocaine instilled in the left nostril while patient remained in the supine position with the head hyperextended The patient tolerated the administration of medication very well A cotton pledget soaked with 1 cc of 4% lidocaine was placed in the right nostril followed by a cotton pledget soaked with 1 cc of 4% lidocaine placed in the left nostril. The patient remained in the supine position with cotton pledgets in place for 35 minutes  The patient tolerated the procedure well  The patient stated that the headache had resolved at completion of the sphenopalatine ganglion block.    PLAN   Continue present medications Zanaflex and oxycodone acetaminophen  F/U PCP Dr. Judithann GravesBerglund for  evaliation of  BP and general medical  condition  F/U surgical evaluation. May consider pending further evaluation  F/U neurological evaluation for further assessment and treatment of headaches has been addressed  May consider radiofrequency rhizolysis or intraspinal procedures pending response to present treatment and F/U evaluation   Patient to call Pain Management Center should patient have concerns prior to scheduled return appointment.    Review of Systems     Objective:   Physical Exam        Assessment & Plan:

## 2015-07-14 NOTE — Patient Instructions (Addendum)
   PLAN   Continue present medications Zanaflex and oxycodone acetaminophen  F/U PCP Dr Judithann GravesBerglund  for evaliation of  BP and general medical  condition  F/U surgical evaluation. Follow-up with neurosurgeon for further assessment of condition as discussed. Follow-up Dr.Durmick as discussed   Neurosurgical evaluation as planned  F/U neurological evaluation as planned with consideration for PNCV/EMG studies  May consider radiofrequency rhizolysis or intraspinal procedures pending response to present treatment and F/U evaluation   Patient to call Pain Management Center should patient have concerns prior to scheduled return appointment.Pain Management Discharge Instructions  General Discharge Instructions :  If you need to reach your doctor call: Monday-Friday 8:00 am - 4:00 pm at [phone] or toll free [phone].  After clinic hours [phone] to have operator reach doctor.  Bring all of your medication bottles to all your appointments in the pain clinic.  To cancel or reschedule your appointment with Pain Management please remember to call 24 hours in advance to avoid a fee.  Refer to the educational materials which you have been given on: General Risks, I had my Procedure. Discharge Instructions, Post Sedation.  Post Procedure Instructions:  The drugs you were given will stay in your system until tomorrow, so for the next 24 hours you should not drive, make any legal decisions or drink any alcoholic beverages.  You may eat anything you prefer, but it is better to start with liquids then soups and crackers, and gradually work up to solid foods.  Please notify your doctor immediately if you have any unusual bleeding, trouble breathing or pain that is not related to your normal pain.  Depending on the type of procedure that was done, some parts of your body may feel week and/or numb.  This usually clears up by tonight or the next day.  Walk with the use of an assistive  device or accompanied by an adult for the 24 hours.  You may use ice on the affected area for the first 24 hours.  Put ice in a Ziploc bag and cover with a towel and place against area 15 minutes on 15 minutes off.  You may switch to heat after 24 hours.

## 2015-07-14 NOTE — Progress Notes (Signed)
Safety precautions to be maintained throughout the outpatient stay will include: orient to surroundings, keep bed in low position, maintain call bell within reach at all times, provide assistance with transfer out of bed and ambulation.  

## 2015-07-15 ENCOUNTER — Telehealth: Payer: Self-pay | Admitting: *Deleted

## 2015-07-15 NOTE — Telephone Encounter (Signed)
No problems post procedure. 

## 2015-07-23 ENCOUNTER — Encounter: Payer: Self-pay | Admitting: Radiology

## 2015-07-23 ENCOUNTER — Ambulatory Visit
Admission: RE | Admit: 2015-07-23 | Discharge: 2015-07-23 | Disposition: A | Discharge status: Home / Self Care | Payer: 59 | Source: Ambulatory Visit | Attending: Neurosurgery | Admitting: Neurosurgery

## 2015-07-23 VITALS — BP 114/66 | HR 85

## 2015-07-23 DIAGNOSIS — Q761 Klippel-Feil syndrome: Secondary | ICD-10-CM

## 2015-07-23 DIAGNOSIS — G44221 Chronic tension-type headache, intractable: Secondary | ICD-10-CM

## 2015-07-23 DIAGNOSIS — M5481 Occipital neuralgia: Secondary | ICD-10-CM

## 2015-07-23 DIAGNOSIS — M47812 Spondylosis without myelopathy or radiculopathy, cervical region: Secondary | ICD-10-CM

## 2015-07-23 DIAGNOSIS — M5030 Other cervical disc degeneration, unspecified cervical region: Secondary | ICD-10-CM

## 2015-07-23 DIAGNOSIS — M5412 Radiculopathy, cervical region: Secondary | ICD-10-CM

## 2015-07-23 MED ORDER — IOHEXOL 300 MG/ML  SOLN
10.0000 mL | Freq: Once | INTRAMUSCULAR | Status: AC | PRN
Start: 2015-07-23 — End: 2015-07-23
  Administered 2015-07-23: 10 mL via INTRATHECAL

## 2015-07-23 MED ORDER — DIAZEPAM 5 MG PO TABS
5.0000 mg | ORAL_TABLET | Freq: Once | ORAL | Status: AC
  Administered 2015-07-23: 5 mg via ORAL

## 2015-07-23 MED ORDER — MEPERIDINE HCL 100 MG/ML IJ SOLN
75.0000 mg | Freq: Once | INTRAMUSCULAR | Status: AC
  Administered 2015-07-23: 75 mg via INTRAMUSCULAR

## 2015-07-23 MED ORDER — ONDANSETRON HCL 4 MG/2ML IJ SOLN
4.0000 mg | Freq: Once | INTRAMUSCULAR | Status: AC
  Administered 2015-07-23: 4 mg via INTRAMUSCULAR

## 2015-07-23 MED ORDER — DIAZEPAM 5 MG PO TABS: 5.0000 mg | ORAL_TABLET | Freq: Once | ORAL | Status: DC

## 2015-07-23 NOTE — Discharge Instructions (Signed)

## 2015-08-05 ENCOUNTER — Ambulatory Visit: Payer: 59 | Attending: Pain Medicine | Admitting: Pain Medicine

## 2015-08-05 ENCOUNTER — Encounter: Payer: Self-pay | Admitting: Pain Medicine

## 2015-08-05 VITALS — BP 105/54 | HR 82 | Temp 98.1°F | Resp 16 | Ht 64.0 in | Wt 150.0 lb

## 2015-08-05 DIAGNOSIS — G43109 Migraine with aura, not intractable, without status migrainosus: Secondary | ICD-10-CM

## 2015-08-05 DIAGNOSIS — M7060 Trochanteric bursitis, unspecified hip: Secondary | ICD-10-CM | POA: Diagnosis not present

## 2015-08-05 DIAGNOSIS — M5030 Other cervical disc degeneration, unspecified cervical region: Secondary | ICD-10-CM

## 2015-08-05 DIAGNOSIS — M546 Pain in thoracic spine: Secondary | ICD-10-CM | POA: Diagnosis present

## 2015-08-05 DIAGNOSIS — M51369 Other intervertebral disc degeneration, lumbar region without mention of lumbar back pain or lower extremity pain: Secondary | ICD-10-CM

## 2015-08-05 DIAGNOSIS — M533 Sacrococcygeal disorders, not elsewhere classified: Secondary | ICD-10-CM | POA: Diagnosis not present

## 2015-08-05 DIAGNOSIS — Q761 Klippel-Feil syndrome: Secondary | ICD-10-CM

## 2015-08-05 DIAGNOSIS — M5481 Occipital neuralgia: Secondary | ICD-10-CM | POA: Diagnosis not present

## 2015-08-05 DIAGNOSIS — M79606 Pain in leg, unspecified: Secondary | ICD-10-CM | POA: Diagnosis present

## 2015-08-05 DIAGNOSIS — G43909 Migraine, unspecified, not intractable, without status migrainosus: Secondary | ICD-10-CM | POA: Diagnosis not present

## 2015-08-05 DIAGNOSIS — M5134 Other intervertebral disc degeneration, thoracic region: Secondary | ICD-10-CM

## 2015-08-05 DIAGNOSIS — M7061 Trochanteric bursitis, right hip: Secondary | ICD-10-CM

## 2015-08-05 DIAGNOSIS — Z981 Arthrodesis status: Secondary | ICD-10-CM | POA: Diagnosis not present

## 2015-08-05 DIAGNOSIS — M5136 Other intervertebral disc degeneration, lumbar region: Secondary | ICD-10-CM

## 2015-08-05 DIAGNOSIS — M47812 Spondylosis without myelopathy or radiculopathy, cervical region: Secondary | ICD-10-CM

## 2015-08-05 DIAGNOSIS — M5010 Cervical disc disorder with radiculopathy, unspecified cervical region: Secondary | ICD-10-CM

## 2015-08-05 DIAGNOSIS — M542 Cervicalgia: Secondary | ICD-10-CM | POA: Diagnosis present

## 2015-08-05 MED ORDER — OXYCODONE-ACETAMINOPHEN 5-325 MG PO TABS: ORAL_TABLET | ORAL | Status: DC

## 2015-08-05 MED ORDER — TIZANIDINE HCL 2 MG PO CAPS: ORAL_CAPSULE | ORAL | Status: DC

## 2015-08-05 NOTE — Progress Notes (Signed)
Subjective:    Patient ID: Theresa Murphy, female    DOB: 1976/04/08, 40 y.o.   MRN: [id number]  HPI  The patient is a 40 year old female who returns to pain management for further evaluation and treatment of pain involving the neck entire back upper and lower extremity regions. At the present time patient states that he has significant pain involving the region of the greater trochanteric region and the buttocks with what appears to be significant for pain due to sacroiliac joint dysfunction sacroiliitis with greater trochanteric bursitis. We discussed patient's condition and will proceed with interventional treatment at time return appointment consisting of block of nerves to the sacroiliac joint with myoneural block injection to be performed in the greater trochanteric region. The patient continue Neurontin and oxycodone as prescribed and will undergo surgical evaluation with Dr. Gerlene FeeKritzer follow-up evaluation of CT myelogram as planned. Patient is a pain involving the cervical upper extremity region with some weakness of the upper extremity in addition to the significant lower back lower extremity pain with pain occurring in the region of the buttocks aggravated by standing walking. We will continue present medications and will proceed with block of nerves to the sacroiliac joint to be performed at time return appointment as discussed and explained to patient who was with understanding and in agreement with suggested treatment plan.      Review of Systems     Objective:   Physical Exam   There was tenderness to palpation of the splenius capitis and occipitalis musculature region of mild to moderate degree. There was mild to moderate tenderness of the cervical facet cervical paraspinal musculature region as well as the thoracic facet thoracic paraspinal musculature region with no crepitus of the thoracic region noted. The patient appeared to be with decreased grip strength on the right  compared to the left and Tinel and Phalen's maneuver were without increase of pain of significant degree.. There was questionably positive Spurling's maneuver. The region of the thoracic region was with no crepitus of the thoracic region noted. Palpation over the lumbar paraspinal musculatures and lumbar facet region associated with moderate discomfort with lateral bending rotation extension and palpation of the lumbar facets reproducing moderate discomfort. Palpation over the PSIS and PII S region reproduced moderately severe discomfort. Severe tenderness to palpation of the left as well as on the right. There was tenderness of the gluteal and piriformis musculature region a moderate degree. There was significant increased pain with pressure applied to the ileum with patient in lateral decubitus position and moderate discomfort of the greater trochanteric region and iliotibial band region was noted as well. Straight leg raise was tolerates approximately 30 without a definite increased pain with dorsiflexion noted. DTRs appeared to be trace at the knees. It was decreased without sensory deficit or dermatomal dystrophy detected. There was negative clonus negative Homans the abdomen was nontender with no costovertebral tenderness noted     Assessment & Plan:    Sacroiliac joint dysfunction  Greater trochanteric bursitis  Bilateral occipital neuralgia   Migraine headache  Degenerative disc disease of the cervical spine  Status post cervical fusion ACDF  Cervical facet syndrome   Cervical radiculopathy       PLAN   Continue present medications Zanaflex and oxycodone acetaminophen  Block of nerves to sacroiliac joint to be performed at time of return appointment  F/U PCP Dr Judithann GravesBerglund  for evaliation of  BP and general medical  condition  F/U surgical evaluation. Follow-up with neurosurgeon for further  assessment of condition as discussed. Follow-up Dr.Durmick  and or Dr. Gerlene Fee as  discussed . Patient will follow-up with Dr. Gerlene Fee status post CT myelogram for further assessment of pain of cervical and upper extremity region  F/U neurological evaluation as planned with consideration for PNCV/EMG studies  May consider radiofrequency rhizolysis or intraspinal procedures pending response to present treatment and F/U evaluation   Patient to call Pain Management Center should patient have concerns prior to scheduled return appointment.

## 2015-08-05 NOTE — Patient Instructions (Addendum)
PLAN   Continue present medications Zanaflex and oxycodone acetaminophen  Block of nerves to sacroiliac joint to be performed at time of return appointment  F/U PCP Dr Judithann GravesBerglund  for evaliation of  BP and general medical  condition  F/U surgical evaluation. Follow-up with neurosurgeon for further assessment of condition as discussed. Follow-up Dr.Durmick  and or Dr. Gerlene FeeKritzer as discussed . Patient will follow-up with Dr. Gerlene FeeKritzer status post CT myelogram for further assessment of pain of cervical and upper extremity region  F/U neurological evaluation as planned with consideration for PNCV/EMG studies  May consider radiofrequency rhizolysis or intraspinal procedures pending response to present treatment and F/U evaluation   Patient to call Pain Management Center should patient have concerns prior to scheduled return appointment. Sacroiliac (SI) Joint Injection Patient Information  Description: The sacroiliac joint connects the scrum (very low back and tailbone) to the ilium (a pelvic bone which also forms half of the hip joint).  Normally this joint experiences very little motion.  When this joint becomes inflamed or unstable low back and or hip and pelvis pain may result.  Injection of this joint with local anesthetics (numbing medicines) and steroids can provide diagnostic information and reduce pain.  This injection is performed with the aid of x-ray guidance into the tailbone area while you are lying on your stomach.   You may experience an electrical sensation down the leg while this is being done.  You may also experience numbness.  We also may ask if we are reproducing your normal pain during the injection.  Conditions which may be treated SI injection:   Low back, buttock, hip or leg pain  Preparation for the Injection:  1. Do not eat any solid food or dairy products within 8 hours of your appointment.  2. You may drink clear liquids up to 3 hours before appointment.  Clear  liquids include water, black coffee, juice or soda.  No milk or cream please. 3. You may take your regular medications, including pain medications with a sip of water before your appointment.  Diabetics should hold regular insulin (if take separately) and take 1/2 normal NPH dose the morning of the procedure.  Carry some sugar containing items with you to your appointment. 4. A driver must accompany you and be prepared to drive you home after your procedure. 5. Bring all of your current medications with you. 6. An IV may be inserted and sedation may be given at the discretion of the physician. 7. A blood pressure cuff, EKG and other monitors will often be applied during the procedure.  Some patients may need to have extra oxygen administered for a short period.  8. You will be asked to provide medical information, including your allergies, prior to the procedure.  We must know immediately if you are taking blood thinners (like Coumadin/Warfarin) or if you are allergic to IV iodine contrast (dye).  We must know if you could possible be pregnant.  Possible side effects:   Bleeding from needle site  Infection (rare, may require surgery)  Nerve injury (rare)  Numbness & tingling (temporary)  A brief convulsion or seizure  Light-headedness (temporary)  Pain at injection site (several days)  Decreased blood pressure (temporary)  Weakness in the leg (temporary)   Call if you experience:   New onset weakness or numbness of an extremity below the injection site that last more than 8 hours.  Hives or difficulty breathing ( go to the emergency room)  Inflammation or  drainage at the injection site  Any new symptoms which are concerning to you  Please note:  Although the local anesthetic injected can often make your back/ hip/ buttock/ leg feel good for several hours after the injections, the pain will likely return.  It takes 3-7 days for steroids to work in the sacroiliac area.  You  may not notice any pain relief for at least that one week.  If effective, we will often do a series of three injections spaced 3-6 weeks apart to maximally decrease your pain.  After the initial series, we generally will wait some months before a repeat injection of the same type.  If you have any questions, please call [phone] [facility]

## 2015-08-05 NOTE — Progress Notes (Signed)
Safety precautions to be maintained throughout the outpatient stay will include: orient to surroundings, keep bed in low position, maintain call bell within reach at all times, provide assistance with transfer out of bed and ambulation.  

## 2015-08-16 ENCOUNTER — Ambulatory Visit: Payer: 59 | Admitting: Pain Medicine

## 2015-09-02 ENCOUNTER — Other Ambulatory Visit: Payer: Self-pay | Admitting: Internal Medicine

## 2015-09-02 ENCOUNTER — Encounter: Payer: Self-pay | Admitting: Pain Medicine

## 2015-09-02 ENCOUNTER — Ambulatory Visit: Payer: 59 | Attending: Pain Medicine | Admitting: Pain Medicine

## 2015-09-02 VITALS — BP 109/72 | HR 79 | Temp 98.3°F | Resp 18 | Ht 64.0 in | Wt 150.0 lb

## 2015-09-02 DIAGNOSIS — M5134 Other intervertebral disc degeneration, thoracic region: Secondary | ICD-10-CM

## 2015-09-02 DIAGNOSIS — R51 Headache: Secondary | ICD-10-CM | POA: Diagnosis present

## 2015-09-02 DIAGNOSIS — M7060 Trochanteric bursitis, unspecified hip: Secondary | ICD-10-CM | POA: Insufficient documentation

## 2015-09-02 DIAGNOSIS — R202 Paresthesia of skin: Secondary | ICD-10-CM | POA: Insufficient documentation

## 2015-09-02 DIAGNOSIS — G43909 Migraine, unspecified, not intractable, without status migrainosus: Secondary | ICD-10-CM | POA: Diagnosis not present

## 2015-09-02 DIAGNOSIS — M5030 Other cervical disc degeneration, unspecified cervical region: Secondary | ICD-10-CM

## 2015-09-02 DIAGNOSIS — Z981 Arthrodesis status: Secondary | ICD-10-CM | POA: Insufficient documentation

## 2015-09-02 DIAGNOSIS — M47812 Spondylosis without myelopathy or radiculopathy, cervical region: Secondary | ICD-10-CM

## 2015-09-02 DIAGNOSIS — G43109 Migraine with aura, not intractable, without status migrainosus: Secondary | ICD-10-CM

## 2015-09-02 DIAGNOSIS — M5136 Other intervertebral disc degeneration, lumbar region: Secondary | ICD-10-CM

## 2015-09-02 DIAGNOSIS — M5481 Occipital neuralgia: Secondary | ICD-10-CM

## 2015-09-02 DIAGNOSIS — M533 Sacrococcygeal disorders, not elsewhere classified: Secondary | ICD-10-CM | POA: Diagnosis not present

## 2015-09-02 DIAGNOSIS — M7061 Trochanteric bursitis, right hip: Secondary | ICD-10-CM

## 2015-09-02 DIAGNOSIS — Q761 Klippel-Feil syndrome: Secondary | ICD-10-CM

## 2015-09-02 DIAGNOSIS — M542 Cervicalgia: Secondary | ICD-10-CM | POA: Diagnosis present

## 2015-09-02 DIAGNOSIS — M5010 Cervical disc disorder with radiculopathy, unspecified cervical region: Secondary | ICD-10-CM

## 2015-09-02 MED ORDER — TIZANIDINE HCL 2 MG PO CAPS: ORAL_CAPSULE | ORAL | Status: DC

## 2015-09-02 MED ORDER — OXYCODONE-ACETAMINOPHEN 5-325 MG PO TABS: ORAL_TABLET | ORAL | Status: DC

## 2015-09-02 NOTE — Patient Instructions (Addendum)
PLAN   Continue present medications Zanaflex and oxycodone acetaminophen  Greater occipital nerve block to be performed at time of return appointment  F/U PCP Dr Judithann GravesBerglund  for evaliation of  BP and general medical  condition  F/U surgical evaluation. Follow-up with neurosurgeon for further assessment of condition as discussed. Follow-up Dr.Durmick  and or Dr. Gerlene FeeKritzer as discussed . Patient will follow-up with Dr. Gerlene FeeKritzer regarding CT myelogram results and symptoms of the cervical lumbar upper and lower extremity regions as discussed  F/U neurological evaluation as planned with consideration for PNCV/EMG studies  May consider radiofrequency rhizolysis or intraspinal procedures pending response to present treatment and F/U evaluation   Patient to call Pain Management Center should patient have concerns prior to scheduled return appointment.Occipital Nerve Block Patient Information  Description: The occipital nerves originate in the cervical (neck) spinal cord and travel upward through muscle and tissue to supply sensation to the back of the head and top of the scalp.  In addition, the nerves control some of the muscles of the scalp.  Occipital neuralgia is an irritation of these nerves which can cause headaches, numbness of the scalp, and neck discomfort.     The occipital nerve block will interrupt nerve transmission through these nerves and can relieve pain and spasm.  The block consists of insertion of a small needle under the skin in the back of the head to deposit local anesthetic (numbing medicine) and/or steroids around the nerve.  The entire block usually lasts less than 5 minutes.  Conditions which may be treated by occipital blocks:   Muscular pain and spasm of the scalp  Nerve irritation, back of the head  Headaches  Upper neck pain  Preparation for the injection:  1. Do not eat any solid food or dairy products within 8 hours of your appointment. 2. You may drink clear  liquids up to 3 hours before appointment.  Clear liquids include water, black coffee, juice or soda.  No milk or cream please. 3. You may take your regular medication, including pain medications, with a sip of water before you appointment.  Diabetics should hold regular insulin (if taken separately) and take 1/2 normal NPH dose the morning of the procedure.  Carry some sugar containing items with you to your appointment. 4. A driver must accompany you and be prepared to drive you home after your procedure. 5. Bring all your current medications with you. 6. An IV may be inserted and sedation may be given at the discretion of the physician. 7. A blood pressure cuff, EKG, and other monitors will often be applied during the procedure.  Some patients may need to have extra oxygen administered for a short period. 8. You will be asked to provide medical information, including your allergies and medications, prior to the procedure.  We must know immediately if you are taking blood thinners (like Coumadin/Warfarin) or if you are allergic to IV iodine contrast (dye).  We must know if you could possible be pregnant.  9. Do not wear a high collared shirt or turtleneck.  Tie long hair up in the back if possible.  Possible side-effects:   Bleeding from needle site  Infection (rare, may require surgery)  Nerve injury (rare)  Hair on back of neck can be tinged with iodine scrub (this will wash out)  Light-headedness (temporary)  Pain at injection site (several days)  Decreased blood pressure (rare, temporary)  Seizure (very rare)  Call if you experience:   Hives or  difficulty breathing ( go to the emergency room)  Inflammation or drainage at the injection site(s)  Please note:  Although the local anesthetic injected can often make your painful muscles or headache feel good for several hours after the injection, the pain may return.  It takes 3-7 days for steroids to work.  You may not notice any  pain relief for at least one week.  If effective, we will often do a series of injections spaced 3-6 weeks apart to maximally decrease your pain.  If you have any questions, please call [phone] [facility]

## 2015-09-02 NOTE — Progress Notes (Signed)
Safety precautions to be maintained throughout the outpatient stay will include: orient to surroundings, keep bed in low position, maintain call bell within reach at all times, provide assistance with transfer out of bed and ambulation.  Oxycodone script handed to patient.

## 2015-09-03 NOTE — Progress Notes (Signed)
   Subjective:    Patient ID: Theresa Murphy, female    DOB: 1975-06-16, 40 y.o.   MRN: [id number]  HPI  The patient is a 40 year old female who returns to pain management for further evaluation and treatment of pain involving the neck associated with headaches with pain in paresthesias of the upper extremities as well.. The patient states she has significant headaches with pain radiating from the back of the neck to the back of the head and continuing to behind the eyes. The patient states that she is in hopes of being able to undergo treatment to decrease severe headaches which have been most incapacitating. The patient will also follow-up with Dr. Gerlene FeeKritzer to further address the abnormalities noted on CT myelogram and we will remain available to consider additional modifications of treatment as discussed with patient. We will proceed with bilateral occipital nerve blocks at time return appointment to decrease the severely incapacitating headaches which patient is experiencing at this time. All agreed to suggested treatment plan. The patient will continue present medications Zanaflex and oxycodone acetaminophen as prescribed. All agreed to suggested treatment plan.     Review of Systems     Objective:   Physical Exam  There was tenderness to palpation of the paraspinal musculature region cervical region cervical facet region palpation which reproduces pain of moderately severe degree. Palpation of the splenius capitis and occipitalis regions reproduce severe pain. There was limited range of motion of the cervical spine noted. Palpation of the acromioclavicular and glenohumeral joint regions was associated with moderate discomfort. The patient appeared to be with slightly decreased grip strength without increased pain with Tinel and Phalen's maneuver. Palpation of the thoracic region thoracic facet region was attends to palpation of moderate degree with no crepitus of the thoracic region noted.  Palpation over the lumbar paraspinal must reason lumbar facet region was associated with mild discomfort with lateral bending rotation extension and palpation the lumbar facets reproducing mild discomfort with mild tenderness of the PSIS and PII S region as well as the gluteal and piriformis musculature regions. Straight leg raise was tolerated to 30 without increased pain with dorsiflexion noted. There was negative clonus negative Homans. DTRs appeared to be trace at the knees. No sensory deficit or dermatomal distribution detected. There was negative clonus negative Homans. Abdomen nontender with no costovertebral tenderness noted      Assessment & Plan:       Sacroiliac joint dysfunction  Greater trochanteric bursitis  Bilateral occipital neuralgia   Migraine headache  Degenerative disc disease of the cervical spine  Status post cervical fusion ACDF  Cervical facet syndrome       PLAN   Continue present medications Zanaflex and oxycodone acetaminophen  Greater occipital nerve block to be performed at time of return appointment  F/U PCP Dr Judithann GravesBerglund  for evaliation of  BP and general medical  condition  F/U surgical evaluation. Follow-up with neurosurgeon for further assessment of condition as discussed. Follow-up Dr.Durmick  and or Dr. Gerlene FeeKritzer as discussed Patient will follow-up with Dr. Gerlene FeeKritzer regarding CT myelogram results and symptoms of the cervical lumbar upper and lower extremity regions as discussed  F/U neurological evaluation as planned with consideration for PNCV/EMG studies  May consider radiofrequency rhizolysis or intraspinal procedures pending response to present treatment and F/U evaluation   Patient to call Pain Management Center should patient have concerns prior to scheduled return appointment.

## 2015-09-13 ENCOUNTER — Ambulatory Visit: Payer: 59 | Attending: Pain Medicine | Admitting: Pain Medicine

## 2015-09-13 ENCOUNTER — Encounter: Payer: Self-pay | Admitting: Pain Medicine

## 2015-09-13 VITALS — BP 112/65 | HR 71 | Temp 96.1°F | Resp 16 | Ht 64.0 in | Wt 150.0 lb

## 2015-09-13 DIAGNOSIS — Z981 Arthrodesis status: Secondary | ICD-10-CM | POA: Diagnosis not present

## 2015-09-13 DIAGNOSIS — M5481 Occipital neuralgia: Secondary | ICD-10-CM

## 2015-09-13 DIAGNOSIS — G43109 Migraine with aura, not intractable, without status migrainosus: Secondary | ICD-10-CM

## 2015-09-13 DIAGNOSIS — Q761 Klippel-Feil syndrome: Secondary | ICD-10-CM

## 2015-09-13 DIAGNOSIS — M542 Cervicalgia: Secondary | ICD-10-CM | POA: Diagnosis present

## 2015-09-13 DIAGNOSIS — M47812 Spondylosis without myelopathy or radiculopathy, cervical region: Secondary | ICD-10-CM

## 2015-09-13 DIAGNOSIS — M51369 Other intervertebral disc degeneration, lumbar region without mention of lumbar back pain or lower extremity pain: Secondary | ICD-10-CM

## 2015-09-13 DIAGNOSIS — M5030 Other cervical disc degeneration, unspecified cervical region: Secondary | ICD-10-CM

## 2015-09-13 DIAGNOSIS — M533 Sacrococcygeal disorders, not elsewhere classified: Secondary | ICD-10-CM

## 2015-09-13 DIAGNOSIS — M5136 Other intervertebral disc degeneration, lumbar region: Secondary | ICD-10-CM

## 2015-09-13 DIAGNOSIS — R51 Headache: Secondary | ICD-10-CM | POA: Diagnosis present

## 2015-09-13 DIAGNOSIS — M5134 Other intervertebral disc degeneration, thoracic region: Secondary | ICD-10-CM

## 2015-09-13 DIAGNOSIS — M7061 Trochanteric bursitis, right hip: Secondary | ICD-10-CM

## 2015-09-13 DIAGNOSIS — M5010 Cervical disc disorder with radiculopathy, unspecified cervical region: Secondary | ICD-10-CM

## 2015-09-13 MED ORDER — SODIUM CHLORIDE 0.9% FLUSH: 20.0000 mL | Freq: Once | INTRAVENOUS | Status: DC

## 2015-09-13 MED ORDER — TRIAMCINOLONE ACETONIDE 40 MG/ML IJ SUSP
40.0000 mg | Freq: Once | INTRAMUSCULAR | Status: DC
  Filled 2015-09-13: qty 1

## 2015-09-13 MED ORDER — ORPHENADRINE CITRATE 30 MG/ML IJ SOLN
60.0000 mg | Freq: Once | INTRAMUSCULAR | Status: DC
  Filled 2015-09-13: qty 2

## 2015-09-13 MED ORDER — BUPIVACAINE HCL (PF) 0.25 % IJ SOLN
30.0000 mL | Freq: Once | INTRAMUSCULAR | Status: DC
  Filled 2015-09-13: qty 30

## 2015-09-13 NOTE — Patient Instructions (Addendum)
PLAN   Continue present medications Zanaflex and oxycodone acetaminophen  F/U PCP Dr Judithann GravesBerglund  for evaliation of  BP and general medical  condition  F/U surgical evaluation. Follow-up with neurosurgeon for further assessment of condition as discussed. Follow-up Dr.Durmick  and or Dr. Gerlene FeeKritzer as discussed .  F/U neurological evaluation as planned with consideration for PNCV/EMG studies  May consider radiofrequency rhizolysis or intraspinal procedures pending response to present treatment and F/U evaluation   Patient to call Pain Management Center should patient have concerns prior to scheduled return appointment.Occipital Nerve Block Patient Information  Description: The occipital nerves originate in the cervical (neck) spinal cord and travel upward through muscle and tissue to supply sensation to the back of the head and top of the scalp.  In addition, the nerves control some of the muscles of the scalp.  Occipital neuralgia is an irritation of these nerves which can cause headaches, numbness of the scalp, and neck discomfort.     The occipital nerve block will interrupt nerve transmission through these nerves and can relieve pain and spasm.  The block consists of insertion of a small needle under the skin in the back of the head to deposit local anesthetic (numbing medicine) and/or steroids around the nerve.  The entire block usually lasts less than 5 minutes.  Conditions which may be treated by occipital blocks:   Muscular pain and spasm of the scalp  Nerve irritation, back of the head  Headaches  Upper neck pain  Preparation for the injection:  1. Do not eat any solid food or dairy products within 8 hours of your appointment. 2. You may drink clear liquids up to 3 hours before appointment.  Clear liquids include water, black coffee, juice or soda.  No milk or cream please. 3. You may take your regular medication, including pain medications, with a sip of water before you  appointment.  Diabetics should hold regular insulin (if taken separately) and take 1/2 normal NPH dose the morning of the procedure.  Carry some sugar containing items with you to your appointment. 4. A driver must accompany you and be prepared to drive you home after your procedure. 5. Bring all your current medications with you. 6. An IV may be inserted and sedation may be given at the discretion of the physician. 7. A blood pressure cuff, EKG, and other monitors will often be applied during the procedure.  Some patients may need to have extra oxygen administered for a short period. 8. You will be asked to provide medical information, including your allergies and medications, prior to the procedure.  We must know immediately if you are taking blood thinners (like Coumadin/Warfarin) or if you are allergic to IV iodine contrast (dye).  We must know if you could possible be pregnant.  9. Do not wear a high collared shirt or turtleneck.  Tie long hair up in the back if possible.  Possible side-effects:   Bleeding from needle site  Infection (rare, may require surgery)  Nerve injury (rare)  Hair on back of neck can be tinged with iodine scrub (this will wash out)  Light-headedness (temporary)  Pain at injection site (several days)  Decreased blood pressure (rare, temporary)  Seizure (very rare)  Call if you experience:   Hives or difficulty breathing ( go to the emergency room)  Inflammation or drainage at the injection site(s)  Please note:  Although the local anesthetic injected can often make your painful muscles or headache feel good  for several hours after the injection, the pain may return.  It takes 3-7 days for steroids to work.  You may not notice any pain relief for at least one week.  If effective, we will often do a series of injections spaced 3-6 weeks apart to maximally decrease your pain.  If you have any questions, please call [phone] [facility] Pain Management Discharge Instructions  General Discharge Instructions :  If you need to reach your doctor call: Monday-Friday 8:00 am - 4:00 pm at [phone] or toll free [phone].  After clinic hours [phone] to have operator reach doctor.  Bring all of your medication bottles to all your appointments in the pain clinic.  To cancel or reschedule your appointment with Pain Management please remember to call 24 hours in advance to avoid a fee.  Refer to the educational materials which you have been given on: General Risks, I had my Procedure. Discharge Instructions, Post Sedation.  Post Procedure Instructions:  The drugs you were given will stay in your system until tomorrow, so for the next 24 hours you should not drive, make any legal decisions or drink any alcoholic beverages.  You may eat anything you prefer, but it is better to start with liquids then soups and crackers, and gradually work up to solid foods.  Please notify your doctor immediately if you have any unusual bleeding, trouble breathing or pain that is not related to your normal pain.  Depending on the type of procedure that was done, some parts of your body may feel week and/or numb.  This usually clears up by tonight or the next day.  Walk with the use of an assistive device or accompanied by an adult for the 24 hours.  You may use ice on the affected area for the first 24 hours.  Put ice in a Ziploc bag and cover with a towel and place against area 15 minutes on 15 minutes off.  You may switch to heat after 24 hours.

## 2015-09-13 NOTE — Progress Notes (Signed)
Safety precautions to be maintained throughout the outpatient stay will include: orient to surroundings, keep bed in low position, maintain call bell within reach at all times, provide assistance with transfer out of bed and ambulation.  

## 2015-09-13 NOTE — Progress Notes (Signed)
   Subjective:    Patient ID: Theresa Murphy, female    DOB: 04/01/1976, 40 y.o.   MRN: [id number]  HPI                                     BILATERAL OCCIPITAL NERVE BLOCKS  NOTE: The patient is a 40 y.o.-year-old female who returns to the Pain Management Center for further evaluation and treatment of pain consisting of pain involving the region of the neck and headache.. The patient is with pain radiating from the neck to the back of the head and continuing forward to the retro-orbital region.   Patient is with prior studies revealing patient to be with degenerative disc disease of the cervical spine Status post cervical fusion ACDF .  there is concern regarding significant component of patient's headaches been due to bilateral occipital neuralgia The risks, benefits, and expectations of the procedure have been discussed and explained to patient, who is understanding and wishes to proceed with interventional treatment as discussed and as explained to patient.  Will proceed with greater occipital nerve blocks with myoneural block injections at this time as discussed and as explained to patient.  All are understanding and in agreement with suggested treatment plan.    PROCEDURE:  Greater occipital nerve block on the left side with EKG, blood pressure, pulse, and pulse oximetry monitoring.  Procedure performed with patient in sitting position.  Greater occipital nerve block on the left side.   With patient in prone position, Betadine prep of proposed entry site accomplished.  Following identification of the nuchal ridge, 22 -gauge needle was inserted at the level of the nuchal ridge medial to the occipital artery.  Following negative aspiration, 4cc 0.25% bupivacaine with Kenalog injected for left greater occipital nerve block.  Needle was removed.  Patient tolerated injection well.   Greater occipital nerve block on the rightt side. The greater occipital nerve block on the right side was performed  exactly as the left greater occipital nerve block was performed and utilizing the same technique.   A total of 10 mg Kenalog was utilized for the entire procedure.  PLAN:    1. Medications: Will continue presently prescribed medications Zanaflex and oxycodone at this time. 2. Patient to follow up with primary care physician Dr. Judithann GravesBerglund for evaluation of blood pressure and general medical condition status post procedure performed on today's visit. 3. Neurological evaluation for further assessment of headaches and for other studies as discussed. 4. Surgical evaluation as discussed. . Patient has neurosurgical evaluation scheduled to review scans and to decide plan of treatment for pain involving the neck and upper extremity regions  5. Patient may be candidate for Botox injections, radiofrequency procedures, as well as implantation type procedures pending response to treatment rendered on today's visit and pending follow-up evaluation. 6. Patient has been advised to adhere to proper body mechanics and to avoid activities which appear to aggravate condition.cations:  Will continue presently prescribed medications at this time. 7. The patient is understanding and in agreement with the suggested treatment plan.   Review of Systems     Objective:   Physical Exam        Assessment & Plan:

## 2015-09-14 ENCOUNTER — Telehealth: Payer: Self-pay | Admitting: *Deleted

## 2015-09-14 NOTE — Telephone Encounter (Signed)
Voicemail left with patient to call our office if there are questions or concerns re; procedure on yesterday.  

## 2015-10-04 ENCOUNTER — Ambulatory Visit: Payer: 59 | Attending: Pain Medicine | Admitting: Pain Medicine

## 2015-10-04 ENCOUNTER — Encounter: Payer: Self-pay | Admitting: Pain Medicine

## 2015-10-04 VITALS — BP 106/60 | HR 72 | Temp 98.5°F | Resp 16 | Ht 64.0 in | Wt 148.0 lb

## 2015-10-04 DIAGNOSIS — Z981 Arthrodesis status: Secondary | ICD-10-CM | POA: Diagnosis not present

## 2015-10-04 DIAGNOSIS — R51 Headache: Secondary | ICD-10-CM | POA: Diagnosis present

## 2015-10-04 DIAGNOSIS — M62838 Other muscle spasm: Secondary | ICD-10-CM | POA: Insufficient documentation

## 2015-10-04 DIAGNOSIS — M47812 Spondylosis without myelopathy or radiculopathy, cervical region: Secondary | ICD-10-CM

## 2015-10-04 DIAGNOSIS — M5030 Other cervical disc degeneration, unspecified cervical region: Secondary | ICD-10-CM | POA: Diagnosis not present

## 2015-10-04 DIAGNOSIS — M5481 Occipital neuralgia: Secondary | ICD-10-CM | POA: Diagnosis not present

## 2015-10-04 DIAGNOSIS — Q761 Klippel-Feil syndrome: Secondary | ICD-10-CM

## 2015-10-04 DIAGNOSIS — M7060 Trochanteric bursitis, unspecified hip: Secondary | ICD-10-CM | POA: Insufficient documentation

## 2015-10-04 DIAGNOSIS — G43109 Migraine with aura, not intractable, without status migrainosus: Secondary | ICD-10-CM

## 2015-10-04 DIAGNOSIS — M7061 Trochanteric bursitis, right hip: Secondary | ICD-10-CM

## 2015-10-04 DIAGNOSIS — M533 Sacrococcygeal disorders, not elsewhere classified: Secondary | ICD-10-CM | POA: Diagnosis not present

## 2015-10-04 DIAGNOSIS — G43909 Migraine, unspecified, not intractable, without status migrainosus: Secondary | ICD-10-CM | POA: Insufficient documentation

## 2015-10-04 DIAGNOSIS — M5010 Cervical disc disorder with radiculopathy, unspecified cervical region: Secondary | ICD-10-CM

## 2015-10-04 DIAGNOSIS — M5136 Other intervertebral disc degeneration, lumbar region: Secondary | ICD-10-CM

## 2015-10-04 DIAGNOSIS — M542 Cervicalgia: Secondary | ICD-10-CM | POA: Diagnosis present

## 2015-10-04 DIAGNOSIS — M5134 Other intervertebral disc degeneration, thoracic region: Secondary | ICD-10-CM

## 2015-10-04 MED ORDER — TIZANIDINE HCL 2 MG PO CAPS: ORAL_CAPSULE | ORAL | Status: DC

## 2015-10-04 MED ORDER — OXYCODONE-ACETAMINOPHEN 5-325 MG PO TABS: ORAL_TABLET | ORAL | Status: DC

## 2015-10-04 NOTE — Progress Notes (Signed)
   Subjective:    Patient ID: Theresa Murphy, female    DOB: 1976-01-05, 40 y.o.   MRN: [id number]  HPI  The patient is a 40 year old female who returns to pain management for further evaluation and treatment of pain involving the neck associated with headaches as well as pain involving the upper extremities as well. The patient's symptoms involve the right upper extremity predominantly. The patient also has had pain involving the right lower extremity. The patient is undergone follow-up neurosurgical evaluation and is without plans for neurosurgical intervention. The patient has been recommended to continue treatment in pain management as per her neurosurgeon. We discussed patient's condition and patient is doing rather well in terms of headache and states that she does have some upper extremity numbness involving the right upper extremity as well as the left lower extremity numbness. We discussed patient's condition and reminded patient to follow up with neurosurgeon on a periodic basis. We will continue medications consisting of Zanaflex and oxycodone acetaminophen at this time. The patient has significant pain due to bursitis of the greater trochanteric region. We will proceed with greater trochanteric bursa injections at time of return appointment. All understanding and agreement suggested treatment plan    Review of Systems     Objective:   Physical Exam  There was tenderness of the splenius capitis and occipitalis regions palpation which reproduces mild discomfort. There were no masses of the hip negative noted and no excessive tens tenderness to palpation of the temporomandibular joint region are region of the sinuses. Palpation over the splenius capitis and occipitalis regions reproduced pain on both the left and the right of minimal degree there was mild tenderness of the acromioclavicular and glenohumeral joint region with moderate muscle spasm noted with patient appeared to be with  unremarkable Spurling's maneuver and unremarkable drop test. The patient appeared to be with bilaterally equal grip strength. Palpation of the thoracic region was without crepitus of the thoracic region. Palpation over the lumbar region was with tenderness over the lumbar facet region a mild degree with straight leg raising tolerated to 30 without a definite increased pain with dorsiflexion noted.. No definite sensory deficit or dermatomal distribution detected. EHL strength appeared to be slightly decreased. There was negative clonus negative Homans and abdomen was nontender with no costovertebral tenderness noted       Assessment & Plan:      Sacroiliac joint dysfunction  Greater trochanteric bursitis  Bilateral occipital neuralgia   Migraine headache  Degenerative disc disease of the cervical spine  Status post cervical fusion ACDF  Cervical facet syndrome       PLAN   Continue present medications Zanaflex and oxycodone acetaminophen  Greater trochanteric bursa injection to be performed at time return appointment  F/U PCP Dr Judithann GravesBerglund  for evaliation of  BP and general medical  condition  F/U surgical evaluation. Follow-up with neurosurgeon for further assessment of condition as discussed. Follow-up Dr.Durmick  as discussed .  F/U neurological evaluation as planned with consideration for PNCV/EMG studies  May consider radiofrequency rhizolysis or intraspinal procedures pending response to present treatment and F/U evaluation   Patient to call [facility] should patient have concerns prior to scheduled return appointment.

## 2015-10-04 NOTE — Progress Notes (Signed)
Patient here for medication management Safety precautions to be maintained throughout the outpatient stay will include: orient to surroundings, keep bed in low position, maintain call bell within reach at all times, provide assistance with transfer out of bed and ambulation.  

## 2015-10-04 NOTE — Patient Instructions (Addendum)
PLAN   Continue present medications Zanaflex and oxycodone acetaminophen  Greater trochanteric bursa injection to be performed at time return appointment  F/U PCP Dr Judithann GravesBerglund  for evaliation of  BP and general medical  condition  F/U surgical evaluation. Follow-up with neurosurgeon for further assessment of condition as discussed. Follow-up Dr.Durmick  as discussed .  F/U neurological evaluation as planned with consideration for PNCV/EMG studies  May consider radiofrequency rhizolysis or intraspinal procedures pending response to present treatment and F/U evaluation   Patient to call [facility] should patient have concerns prior to scheduled return appointment.GENERAL RISKS AND COMPLICATIONS  What are the risk, side effects and possible complications? Generally speaking, most procedures are safe.  However, with any procedure there are risks, side effects, and the possibility of complications.  The risks and complications are dependent upon the sites that are lesioned, or the type of nerve block to be performed.  The closer the procedure is to the spine, the more serious the risks are.  Great care is taken when placing the radio frequency needles, block needles or lesioning probes, but sometimes complications can occur. 1. Infection: Any time there is an injection through the skin, there is a risk of infection.  This is why sterile conditions are used for these blocks.  There are four possible types of infection. 1. Localized skin infection. 2. Central Nervous System Infection-This can be in the form of Meningitis, which can be deadly. 3. Epidural Infections-This can be in the form of an epidural abscess, which can cause pressure inside of the spine, causing compression of the spinal cord with subsequent paralysis. This would require an emergency surgery to decompress, and there are no guarantees that the patient would recover from the paralysis. 4. Discitis-This is an infection  of the intervertebral discs.  It occurs in about 1% of discography procedures.  It is difficult to treat and it may lead to surgery.        2. Pain: the needles have to go through skin and soft tissues, will cause soreness.       3. Damage to internal structures:  The nerves to be lesioned may be near blood vessels or    other nerves which can be potentially damaged.       4. Bleeding: Bleeding is more common if the patient is taking blood thinners such as  aspirin, Coumadin, Ticiid, Plavix, etc., or if he/she have some genetic predisposition  such as hemophilia. Bleeding into the spinal canal can cause compression of the spinal  cord with subsequent paralysis.  This would require an emergency surgery to  decompress and there are no guarantees that the patient would recover from the  paralysis.       5. Pneumothorax:  Puncturing of a lung is a possibility, every time a needle is introduced in  the area of the chest or upper back.  Pneumothorax refers to free air around the  collapsed lung(s), inside of the thoracic cavity (chest cavity).  Another two possible  complications related to a similar event would include: Hemothorax and Chylothorax.   These are variations of the Pneumothorax, where instead of air around the collapsed  lung(s), you may have blood or chyle, respectively.       6. Spinal headaches: They may occur with any procedures in the area of the spine.       7. Persistent CSF (Cerebro-Spinal Fluid) leakage: This is a rare problem, but may occur  with prolonged intrathecal or  epidural catheters either due to the formation of a fistulous  track or a dural tear.       8. Nerve damage: By working so close to the spinal cord, there is always a possibility of  nerve damage, which could be as serious as a permanent spinal cord injury with  paralysis.       9. Death:  Although rare, severe deadly allergic reactions known as "Anaphylactic  reaction" can occur to any of the medications used.       10. Worsening of the symptoms:  We can always make thing worse.  What are the chances of something like this happening? Chances of any of this occuring are extremely low.  By statistics, you have more of a chance of getting killed in a motor vehicle accident: while driving to the hospital than any of the above occurring .  Nevertheless, you should be aware that they are possibilities.  In general, it is similar to taking a shower.  Everybody knows that you can slip, hit your head and get killed.  Does that mean that you should not shower again?  Nevertheless always keep in mind that statistics do not mean anything if you happen to be on the wrong side of them.  Even if a procedure has a 1 (one) in a 1,000,000 (million) chance of going wrong, it you happen to be that one..Also, keep in mind that by statistics, you have more of a chance of having something go wrong when taking medications.  Who should not have this procedure? If you are on a blood thinning medication (e.g. Coumadin, Plavix, see list of "Blood Thinners"), or if you have an active infection going on, you should not have the procedure.  If you are taking any blood thinners, please inform your physician.  How should I prepare for this procedure?  Do not eat or drink anything at least six hours prior to the procedure.  Bring a driver with you .  It cannot be a taxi.  Come accompanied by an adult that can drive you back, and that is strong enough to help you if your legs get weak or numb from the local anesthetic.  Take all of your medicines the morning of the procedure with just enough water to swallow them.  If you have diabetes, make sure that you are scheduled to have your procedure done first thing in the morning, whenever possible.  If you have diabetes, take only half of your insulin dose and notify our nurse that you have done so as soon as you arrive at the clinic.  If you are diabetic, but only take blood sugar pills (oral  hypoglycemic), then do not take them on the morning of your procedure.  You may take them after you have had the procedure.  Do not take aspirin or any aspirin-containing medications, at least eleven (11) days prior to the procedure.  They may prolong bleeding.  Wear loose fitting clothing that may be easy to take off and that you would not mind if it got stained with Betadine or blood.  Do not wear any jewelry or perfume  Remove any nail coloring.  It will interfere with some of our monitoring equipment.  NOTE: Remember that this is not meant to be interpreted as a complete list of all possible complications.  Unforeseen problems may occur.  BLOOD THINNERS The following drugs contain aspirin or other products, which can cause increased bleeding during surgery and should not be  taken for 2 weeks prior to and 1 week after surgery.  If you should need take something for relief of minor pain, you may take acetaminophen which is found in Tylenol,m Datril, Anacin-3 and Panadol. It is not blood thinner. The products listed below are.  Do not take any of the products listed below in addition to any listed on your instruction sheet.  A.P.C or A.P.C with Codeine Codeine Phosphate Capsules #3 Ibuprofen Ridaura  ABC compound Congesprin Imuran rimadil  Advil Cope Indocin Robaxisal  Alka-Seltzer Effervescent Pain Reliever and Antacid Coricidin or Coricidin-D  Indomethacin Rufen  Alka-Seltzer plus Cold Medicine Cosprin Ketoprofen S-A-C Tablets  Anacin Analgesic Tablets or Capsules Coumadin Korlgesic Salflex  Anacin Extra Strength Analgesic tablets or capsules CP-2 Tablets Lanoril Salicylate  Anaprox Cuprimine Capsules Levenox Salocol  Anexsia-D Dalteparin Magan Salsalate  Anodynos Darvon compound Magnesium Salicylate Sine-off  Ansaid Dasin Capsules Magsal Sodium Salicylate  Anturane Depen Capsules Marnal Soma  APF Arthritis pain formula Dewitt's Pills Measurin Stanback  Argesic Dia-Gesic Meclofenamic  Sulfinpyrazone  Arthritis Bayer Timed Release Aspirin Diclofenac Meclomen Sulindac  Arthritis pain formula Anacin Dicumarol Medipren Supac  Analgesic (Safety coated) Arthralgen Diffunasal Mefanamic Suprofen  Arthritis Strength Bufferin Dihydrocodeine Mepro Compound Suprol  Arthropan liquid Dopirydamole Methcarbomol with Aspirin Synalgos  ASA tablets/Enseals Disalcid Micrainin Tagament  Ascriptin Doan's Midol Talwin  Ascriptin A/D Dolene Mobidin Tanderil  Ascriptin Extra Strength Dolobid Moblgesic Ticlid  Ascriptin with Codeine Doloprin or Doloprin with Codeine Momentum Tolectin  Asperbuf Duoprin Mono-gesic Trendar  Aspergum Duradyne Motrin or Motrin IB Triminicin  Aspirin plain, buffered or enteric coated Durasal Myochrisine Trigesic  Aspirin Suppositories Easprin Nalfon Trillsate  Aspirin with Codeine Ecotrin Regular or Extra Strength Naprosyn Uracel  Atromid-S Efficin Naproxen Ursinus  Auranofin Capsules Elmiron Neocylate Vanquish  Axotal Emagrin Norgesic Verin  Azathioprine Empirin or Empirin with Codeine Normiflo Vitamin E  Azolid Emprazil Nuprin Voltaren  Bayer Aspirin plain, buffered or children's or timed BC Tablets or powders Encaprin Orgaran Warfarin Sodium  Buff-a-Comp Enoxaparin Orudis Zorpin  Buff-a-Comp with Codeine Equegesic Os-Cal-Gesic   Buffaprin Excedrin plain, buffered or Extra Strength Oxalid   Bufferin Arthritis Strength Feldene Oxphenbutazone   Bufferin plain or Extra Strength Feldene Capsules Oxycodone with Aspirin   Bufferin with Codeine Fenoprofen Fenoprofen Pabalate or Pabalate-SF   Buffets II Flogesic Panagesic   Buffinol plain or Extra Strength Florinal or Florinal with Codeine Panwarfarin   Buf-Tabs Flurbiprofen Penicillamine   Butalbital Compound Four-way cold tablets Penicillin   Butazolidin Fragmin Pepto-Bismol   Carbenicillin Geminisyn Percodan   Carna Arthritis Reliever Geopen Persantine   Carprofen Gold's salt Persistin   Chloramphenicol Goody's  Phenylbutazone   Chloromycetin Haltrain Piroxlcam   Clmetidine heparin Plaquenil   Cllnoril Hyco-pap Ponstel   Clofibrate Hydroxy chloroquine Propoxyphen         Before stopping any of these medications, be sure to consult the physician who ordered them.  Some, such as Coumadin (Warfarin) are ordered to prevent or treat serious conditions such as "deep thrombosis", "pumonary embolisms", and other heart problems.  The amount of time that you may need off of the medication may also vary with the medication and the reason for which you were taking it.  If you are taking any of these medications, please make sure you notify your pain physician before you undergo any procedures.

## 2015-10-05 ENCOUNTER — Other Ambulatory Visit: Payer: Self-pay | Admitting: Internal Medicine

## 2015-10-25 ENCOUNTER — Encounter: Payer: Self-pay | Admitting: Pain Medicine

## 2015-10-25 ENCOUNTER — Ambulatory Visit: Payer: 59 | Attending: Pain Medicine | Admitting: Pain Medicine

## 2015-10-25 VITALS — BP 100/53 | HR 77 | Temp 97.9°F | Resp 16 | Ht 64.0 in | Wt 147.0 lb

## 2015-10-25 DIAGNOSIS — M19011 Primary osteoarthritis, right shoulder: Secondary | ICD-10-CM

## 2015-10-25 DIAGNOSIS — M25551 Pain in right hip: Secondary | ICD-10-CM | POA: Diagnosis not present

## 2015-10-25 DIAGNOSIS — M5134 Other intervertebral disc degeneration, thoracic region: Secondary | ICD-10-CM

## 2015-10-25 DIAGNOSIS — M47812 Spondylosis without myelopathy or radiculopathy, cervical region: Secondary | ICD-10-CM

## 2015-10-25 DIAGNOSIS — M5030 Other cervical disc degeneration, unspecified cervical region: Secondary | ICD-10-CM

## 2015-10-25 DIAGNOSIS — M19019 Primary osteoarthritis, unspecified shoulder: Secondary | ICD-10-CM | POA: Insufficient documentation

## 2015-10-25 DIAGNOSIS — M5481 Occipital neuralgia: Secondary | ICD-10-CM

## 2015-10-25 DIAGNOSIS — Q761 Klippel-Feil syndrome: Secondary | ICD-10-CM

## 2015-10-25 DIAGNOSIS — M5010 Cervical disc disorder with radiculopathy, unspecified cervical region: Secondary | ICD-10-CM

## 2015-10-25 DIAGNOSIS — M5136 Other intervertebral disc degeneration, lumbar region: Secondary | ICD-10-CM

## 2015-10-25 DIAGNOSIS — M7061 Trochanteric bursitis, right hip: Secondary | ICD-10-CM

## 2015-10-25 DIAGNOSIS — M533 Sacrococcygeal disorders, not elsewhere classified: Secondary | ICD-10-CM

## 2015-10-25 DIAGNOSIS — G43109 Migraine with aura, not intractable, without status migrainosus: Secondary | ICD-10-CM

## 2015-10-25 DIAGNOSIS — M19012 Primary osteoarthritis, left shoulder: Secondary | ICD-10-CM

## 2015-10-25 MED ORDER — SODIUM CHLORIDE 0.9% FLUSH: 20.0000 mL | Freq: Once | INTRAVENOUS | Status: DC

## 2015-10-25 MED ORDER — TRIAMCINOLONE ACETONIDE 40 MG/ML IJ SUSP
40.0000 mg | Freq: Once | INTRAMUSCULAR | Status: DC
Start: 2015-10-25 — End: 2017-08-29

## 2015-10-25 MED ORDER — TRIAMCINOLONE ACETONIDE 40 MG/ML IJ SUSP
INTRAMUSCULAR | Status: AC
  Filled 2015-10-25: qty 1

## 2015-10-25 MED ORDER — SODIUM CHLORIDE 0.9 % IJ SOLN
INTRAMUSCULAR | Status: AC
  Filled 2015-10-25: qty 10

## 2015-10-25 NOTE — Progress Notes (Signed)
   Subjective:    Patient ID: Theresa Murphy, female    DOB: December 12, 1975, 40 y.o.   MRN: [id number]  HPI                                                                RIGHT GREATER TROCHANTERIC BURSA  INJECTION     The patient is a 40  -year-old female who returns to pain management for further evaluation and treatment of pain involving the greater trochanteric region. The patient is with  severely disabling pain reproduced  by palpation of  the greater  trochanteric region. . There is concern regarding the patient's pain being due to a significant component of greater trochanteric bursitis and iliotibial band syndrome. The risk, benefits, and expectations of the procedure were discussed with and explained to the patient who was with understanding and in agreement with the suggested treatment plan.       Description Of Procedure:  Right Greater Trochanteric Bursa Injection  The patient was positioned in the lateral decubitus position. EKG, blood pressure, pulse, and pulse oximetry monitors were all in place. Identification of landmarks for needle entry for the procedure was accomplished and Betadine prep of the proposed needle entry site was accomplished.  A 22-gauge needle was inserted and 5 cc of 0.25% bupivacaine with Kenalog was injected. The needle was removed. The needle was reinserted in the region of the greater trochanter and in additional 5 cc of preservative-free normal saline with Kenalog was injected for right greater trochanteric bursa injection. The needle was removed.  The patient tolerated the procedure well  A total of 20 milligrams of Kenalog was utilized for the procedure     PLAN  Continue present medications Zanaflex and oxycodone  F/U PCP Dr. Judithann GravesBerglund  for evaliation of  BP and general medical  condition  F/U surgical evaluation. The patient will follow-up with Dr.Durmick as needed. The patient is status post surgical evaluation without recommendations  for surgical intervention. The patient has been advised to continue treatment in pain management   F/U neurological evaluation. May consider pending follow-up evaluations  May consider radiofrequency rhizolysis or intraspinal procedures pending response to present treatment and F/U evaluation   Patient to call Pain Management Center should patient have concerns prior to scheduled return appointment.    Review of Systems     Objective:   Physical Exam        Assessment & Plan:

## 2015-10-25 NOTE — Patient Instructions (Addendum)
PLAN   Continue present medications Zanaflex and oxycodone acetaminophen  F/U PCP Dr Judithann GravesBerglund  for evaliation of  BP and general medical  condition  F/U surgical evaluation. Follow-up with neurosurgeon for further assessment of condition as discussed. Follow-up Dr.Durmick  as discussed .  F/U neurological evaluation as planned with consideration for PNCV/EMG studies  May consider radiofrequency rhizolysis or intraspinal procedures pending response to present treatment and F/U evaluation   Patient to call Pain Management Center should patient have concerns prior to scheduled return appointment.Pain Management Discharge Instructions  General Discharge Instructions :  If you need to reach your doctor call: Monday-Friday 8:00 am - 4:00 pm at [phone] or toll free [phone].  After clinic hours [phone] to have operator reach doctor.  Bring all of your medication bottles to all your appointments in the pain clinic.  To cancel or reschedule your appointment with Pain Management please remember to call 24 hours in advance to avoid a fee.  Refer to the educational materials which you have been given on: General Risks, I had my Procedure. Discharge Instructions, Post Sedation.  Post Procedure Instructions:  The drugs you were given will stay in your system until tomorrow, so for the next 24 hours you should not drive, make any legal decisions or drink any alcoholic beverages.  You may eat anything you prefer, but it is better to start with liquids then soups and crackers, and gradually work up to solid foods.  Please notify your doctor immediately if you have any unusual bleeding, trouble breathing or pain that is not related to your normal pain.  Depending on the type of procedure that was done, some parts of your body may feel week and/or numb.  This usually clears up by tonight or the next day.  Walk with the use of an assistive device or accompanied by an adult for the 24  hours.  You may use ice on the affected area for the first 24 hours.  Put ice in a Ziploc bag and cover with a towel and place against area 15 minutes on 15 minutes off.  You may switch to heat after 24 hours.GENERAL RISKS AND COMPLICATIONS  What are the risk, side effects and possible complications? Generally speaking, most procedures are safe.  However, with any procedure there are risks, side effects, and the possibility of complications.  The risks and complications are dependent upon the sites that are lesioned, or the type of nerve block to be performed.  The closer the procedure is to the spine, the more serious the risks are.  Great care is taken when placing the radio frequency needles, block needles or lesioning probes, but sometimes complications can occur. 1. Infection: Any time there is an injection through the skin, there is a risk of infection.  This is why sterile conditions are used for these blocks.  There are four possible types of infection. 1. Localized skin infection. 2. Central Nervous System Infection-This can be in the form of Meningitis, which can be deadly. 3. Epidural Infections-This can be in the form of an epidural abscess, which can cause pressure inside of the spine, causing compression of the spinal cord with subsequent paralysis. This would require an emergency surgery to decompress, and there are no guarantees that the patient would recover from the paralysis. 4. Discitis-This is an infection of the intervertebral discs.  It occurs in about 1% of discography procedures.  It is difficult to treat and it may lead to surgery.  2. Pain: the needles have to go through skin and soft tissues, will cause soreness.       3. Damage to internal structures:  The nerves to be lesioned may be near blood vessels or    other nerves which can be potentially damaged.       4. Bleeding: Bleeding is more common if the patient is taking blood thinners such as  aspirin, Coumadin,  Ticiid, Plavix, etc., or if he/she have some genetic predisposition  such as hemophilia. Bleeding into the spinal canal can cause compression of the spinal  cord with subsequent paralysis.  This would require an emergency surgery to  decompress and there are no guarantees that the patient would recover from the  paralysis.       5. Pneumothorax:  Puncturing of a lung is a possibility, every time a needle is introduced in  the area of the chest or upper back.  Pneumothorax refers to free air around the  collapsed lung(s), inside of the thoracic cavity (chest cavity).  Another two possible  complications related to a similar event would include: Hemothorax and Chylothorax.   These are variations of the Pneumothorax, where instead of air around the collapsed  lung(s), you may have blood or chyle, respectively.       6. Spinal headaches: They may occur with any procedures in the area of the spine.       7. Persistent CSF (Cerebro-Spinal Fluid) leakage: This is a rare problem, but may occur  with prolonged intrathecal or epidural catheters either due to the formation of a fistulous  track or a dural tear.       8. Nerve damage: By working so close to the spinal cord, there is always a possibility of  nerve damage, which could be as serious as a permanent spinal cord injury with  paralysis.       9. Death:  Although rare, severe deadly allergic reactions known as "Anaphylactic  reaction" can occur to any of the medications used.      10. Worsening of the symptoms:  We can always make thing worse.  What are the chances of something like this happening? Chances of any of this occuring are extremely low.  By statistics, you have more of a chance of getting killed in a motor vehicle accident: while driving to the hospital than any of the above occurring .  Nevertheless, you should be aware that they are possibilities.  In general, it is similar to taking a shower.  Everybody knows that you can slip, hit your head and  get killed.  Does that mean that you should not shower again?  Nevertheless always keep in mind that statistics do not mean anything if you happen to be on the wrong side of them.  Even if a procedure has a 1 (one) in a 1,000,000 (million) chance of going wrong, it you happen to be that one..Also, keep in mind that by statistics, you have more of a chance of having something go wrong when taking medications.  Who should not have this procedure? If you are on a blood thinning medication (e.g. Coumadin, Plavix, see list of "Blood Thinners"), or if you have an active infection going on, you should not have the procedure.  If you are taking any blood thinners, please inform your physician.  How should I prepare for this procedure?  Do not eat or drink anything at least six hours prior to the procedure.  Bring a driver  with you .  It cannot be a taxi.  Come accompanied by an adult that can drive you back, and that is strong enough to help you if your legs get weak or numb from the local anesthetic.  Take all of your medicines the morning of the procedure with just enough water to swallow them.  If you have diabetes, make sure that you are scheduled to have your procedure done first thing in the morning, whenever possible.  If you have diabetes, take only half of your insulin dose and notify our nurse that you have done so as soon as you arrive at the clinic.  If you are diabetic, but only take blood sugar pills (oral hypoglycemic), then do not take them on the morning of your procedure.  You may take them after you have had the procedure.  Do not take aspirin or any aspirin-containing medications, at least eleven (11) days prior to the procedure.  They may prolong bleeding.  Wear loose fitting clothing that may be easy to take off and that you would not mind if it got stained with Betadine or blood.  Do not wear any jewelry or perfume  Remove any nail coloring.  It will interfere with some of  our monitoring equipment.  NOTE: Remember that this is not meant to be interpreted as a complete list of all possible complications.  Unforeseen problems may occur.  BLOOD THINNERS The following drugs contain aspirin or other products, which can cause increased bleeding during surgery and should not be taken for 2 weeks prior to and 1 week after surgery.  If you should need take something for relief of minor pain, you may take acetaminophen which is found in Tylenol,m Datril, Anacin-3 and Panadol. It is not blood thinner. The products listed below are.  Do not take any of the products listed below in addition to any listed on your instruction sheet.  A.P.C or A.P.C with Codeine Codeine Phosphate Capsules #3 Ibuprofen Ridaura  ABC compound Congesprin Imuran rimadil  Advil Cope Indocin Robaxisal  Alka-Seltzer Effervescent Pain Reliever and Antacid Coricidin or Coricidin-D  Indomethacin Rufen  Alka-Seltzer plus Cold Medicine Cosprin Ketoprofen S-A-C Tablets  Anacin Analgesic Tablets or Capsules Coumadin Korlgesic Salflex  Anacin Extra Strength Analgesic tablets or capsules CP-2 Tablets Lanoril Salicylate  Anaprox Cuprimine Capsules Levenox Salocol  Anexsia-D Dalteparin Magan Salsalate  Anodynos Darvon compound Magnesium Salicylate Sine-off  Ansaid Dasin Capsules Magsal Sodium Salicylate  Anturane Depen Capsules Marnal Soma  APF Arthritis pain formula Dewitt's Pills Measurin Stanback  Argesic Dia-Gesic Meclofenamic Sulfinpyrazone  Arthritis Bayer Timed Release Aspirin Diclofenac Meclomen Sulindac  Arthritis pain formula Anacin Dicumarol Medipren Supac  Analgesic (Safety coated) Arthralgen Diffunasal Mefanamic Suprofen  Arthritis Strength Bufferin Dihydrocodeine Mepro Compound Suprol  Arthropan liquid Dopirydamole Methcarbomol with Aspirin Synalgos  ASA tablets/Enseals Disalcid Micrainin Tagament  Ascriptin Doan's Midol Talwin  Ascriptin A/D Dolene Mobidin Tanderil  Ascriptin Extra Strength  Dolobid Moblgesic Ticlid  Ascriptin with Codeine Doloprin or Doloprin with Codeine Momentum Tolectin  Asperbuf Duoprin Mono-gesic Trendar  Aspergum Duradyne Motrin or Motrin IB Triminicin  Aspirin plain, buffered or enteric coated Durasal Myochrisine Trigesic  Aspirin Suppositories Easprin Nalfon Trillsate  Aspirin with Codeine Ecotrin Regular or Extra Strength Naprosyn Uracel  Atromid-S Efficin Naproxen Ursinus  Auranofin Capsules Elmiron Neocylate Vanquish  Axotal Emagrin Norgesic Verin  Azathioprine Empirin or Empirin with Codeine Normiflo Vitamin E  Azolid Emprazil Nuprin Voltaren  Bayer Aspirin plain, buffered or children's or timed BC Tablets or powders  Encaprin Orgaran Warfarin Sodium  Buff-a-Comp Enoxaparin Orudis Zorpin  Buff-a-Comp with Codeine Equegesic Os-Cal-Gesic   Buffaprin Excedrin plain, buffered or Extra Strength Oxalid   Bufferin Arthritis Strength Feldene Oxphenbutazone   Bufferin plain or Extra Strength Feldene Capsules Oxycodone with Aspirin   Bufferin with Codeine Fenoprofen Fenoprofen Pabalate or Pabalate-SF   Buffets II Flogesic Panagesic   Buffinol plain or Extra Strength Florinal or Florinal with Codeine Panwarfarin   Buf-Tabs Flurbiprofen Penicillamine   Butalbital Compound Four-way cold tablets Penicillin   Butazolidin Fragmin Pepto-Bismol   Carbenicillin Geminisyn Percodan   Carna Arthritis Reliever Geopen Persantine   Carprofen Gold's salt Persistin   Chloramphenicol Goody's Phenylbutazone   Chloromycetin Haltrain Piroxlcam   Clmetidine heparin Plaquenil   Cllnoril Hyco-pap Ponstel   Clofibrate Hydroxy chloroquine Propoxyphen         Before stopping any of these medications, be sure to consult the physician who ordered them.  Some, such as Coumadin (Warfarin) are ordered to prevent or treat serious conditions such as "deep thrombosis", "pumonary embolisms", and other heart problems.  The amount of time that you may need off of the medication may also  vary with the medication and the reason for which you were taking it.  If you are taking any of these medications, please make sure you notify your pain physician before you undergo any procedures.

## 2015-10-25 NOTE — Progress Notes (Signed)
Safety precautions to be maintained throughout the outpatient stay will include: orient to surroundings, keep bed in low position, maintain call bell within reach at all times, provide assistance with transfer out of bed and ambulation.  

## 2015-10-26 ENCOUNTER — Telehealth: Payer: Self-pay | Admitting: *Deleted

## 2015-10-26 NOTE — Telephone Encounter (Signed)
Spoke with patient, verbalizes no questions or concerns.

## 2015-11-02 ENCOUNTER — Ambulatory Visit: Payer: 59 | Attending: Pain Medicine | Admitting: Pain Medicine

## 2015-11-02 ENCOUNTER — Encounter: Payer: Self-pay | Admitting: Pain Medicine

## 2015-11-02 VITALS — BP 116/77 | HR 71 | Temp 98.0°F | Resp 16 | Ht 64.0 in | Wt 145.0 lb

## 2015-11-02 DIAGNOSIS — Q761 Klippel-Feil syndrome: Secondary | ICD-10-CM

## 2015-11-02 DIAGNOSIS — G43909 Migraine, unspecified, not intractable, without status migrainosus: Secondary | ICD-10-CM | POA: Diagnosis not present

## 2015-11-02 DIAGNOSIS — M5136 Other intervertebral disc degeneration, lumbar region: Secondary | ICD-10-CM

## 2015-11-02 DIAGNOSIS — M5481 Occipital neuralgia: Secondary | ICD-10-CM | POA: Diagnosis not present

## 2015-11-02 DIAGNOSIS — M19011 Primary osteoarthritis, right shoulder: Secondary | ICD-10-CM

## 2015-11-02 DIAGNOSIS — M7060 Trochanteric bursitis, unspecified hip: Secondary | ICD-10-CM | POA: Insufficient documentation

## 2015-11-02 DIAGNOSIS — M19012 Primary osteoarthritis, left shoulder: Secondary | ICD-10-CM

## 2015-11-02 DIAGNOSIS — M5030 Other cervical disc degeneration, unspecified cervical region: Secondary | ICD-10-CM | POA: Diagnosis not present

## 2015-11-02 DIAGNOSIS — M533 Sacrococcygeal disorders, not elsewhere classified: Secondary | ICD-10-CM | POA: Diagnosis not present

## 2015-11-02 DIAGNOSIS — R51 Headache: Secondary | ICD-10-CM | POA: Diagnosis present

## 2015-11-02 DIAGNOSIS — M542 Cervicalgia: Secondary | ICD-10-CM | POA: Diagnosis present

## 2015-11-02 DIAGNOSIS — Z981 Arthrodesis status: Secondary | ICD-10-CM | POA: Diagnosis not present

## 2015-11-02 DIAGNOSIS — M545 Low back pain: Secondary | ICD-10-CM | POA: Diagnosis present

## 2015-11-02 DIAGNOSIS — M47812 Spondylosis without myelopathy or radiculopathy, cervical region: Secondary | ICD-10-CM

## 2015-11-02 DIAGNOSIS — M5134 Other intervertebral disc degeneration, thoracic region: Secondary | ICD-10-CM

## 2015-11-02 MED ORDER — OXYCODONE-ACETAMINOPHEN 5-325 MG PO TABS: ORAL_TABLET | ORAL | 0 refills | Status: DC

## 2015-11-02 MED ORDER — TIZANIDINE HCL 2 MG PO CAPS: ORAL_CAPSULE | ORAL | 0 refills | Status: DC

## 2015-11-02 MED ORDER — CYCLOBENZAPRINE HCL 10 MG PO TABS
10.0000 mg | ORAL_TABLET | Freq: Every day | ORAL | Status: DC
  Filled 2015-11-02: qty 1

## 2015-11-02 NOTE — Patient Instructions (Addendum)
  PLAN   Continue present medication oxycodone acetaminophen and STOP Zanaflex  BEGIN Flexeril as discussed  CAUTION Flexeril and oxycodone acetaminophen can have significant side effects Medication can cause respiratory depression and cause you to stop breathing, cause excessive sedation, cause confusion and other side effects.  Exercise extreme caution when taking medication and call EMS or go to the Emergency Department immediately if you develop any of these symptoms   F/U PCP Dr Judithann Graves  for evaliation of  BP and general medical  condition  F/U surgical evaluation. Follow-up with neurosurgeon for further assessment of condition as discussed. Follow-up Dr.Durmick  as discussed .  F/U neurological evaluation as planned with consideration for PNCV/EMG studies  May consider radiofrequency rhizolysis or intraspinal procedures pending response to present treatment and F/U evaluation   Patient to call Pain Management Center should patient have concerns prior to scheduled return appointment

## 2015-11-02 NOTE — Progress Notes (Signed)
     The patient is a 40 year old female who returns to pain management for further evaluation and treatment of pain involving the neck upper extremity region headaches as well as the mid lower back and lower extremity region. The patient is undergone neurosurgical evaluation without recommendation for surgical intervention. The patient has been recommended to continue treatment in pain management. The patient has had significant improvement of her pain involving the greater trochanteric region and following greater trochanteric bursa injection. The patient states that she is unable to tolerate Zanaflex and that she would like to be considered for another muscle relaxant. We discussed patient's condition and we will replace Zanaflex with Flexeril. The patient has been cautioned regarding side effects of the medications and we will consider modification of medications pending follow-up evaluation. The patient was with understanding and agreement suggested treatment plan. The patient stated that overall she was doing much better this time and would like to replace Zanaflex with another medication. We will observe response to Flexeril and consider additional modifications of treatment pending follow-up evaluation. All agreed to suggested treatment plan    Physical examination  Physical examination revealed tenderness of the splenius capitis and occipitalis region a mild degree with well-healed surgical scar of the cervical region without increased warmth and erythema in the region scar there was tenderness of the splenius And occipitalis region of mild degree with patient able to perform drop test without significant difficulty. The patient appeared to be with slightly decreased grip strength with Tinel and Phalen's maneuver reproducing mild discomfort. Palpation of the thoracic region was without crepitus of the thoracic region and was with mild muscle spasms noted in the upper mid and lower thoracic regions.  There was there was unremarkable Spurling's maneuver. Palpation over the lumbar region was attends to palpation of mild degree with lateral bending rotation extension and palpation of lumbar facets reproducing mild discomfort. Palpation over the PSIS and PII S region reproduced mild to moderate discomfort with mild to moderate tenderness of the greater trochanteric region iliotibial band region. No sensory deficit or dermatomal distribution detected. There was negative clonus negative Homans. DTRs were trace at the knees. Straight leg raising was tolerated to 30 without increased pain with dorsiflexion noted. There was negative clonus negative Homans. Abdomen nontender with no costovertebral tenderness noted    Assessment  Sacroiliac joint dysfunction  Greater trochanteric bursitis  Bilateral occipital neuralgia   Migraine headache  Degenerative disc disease of the cervical spine  Status post cervical fusion ACDF  Cervical facet syndrome      PLAN   Continue present medication oxycodone acetaminophen and STOP Zanaflex  BEGIN Flexeril as discussed  CAUTION Flexeril and oxycodone acetaminophen can have significant side effects Medication can cause respiratory depression and cause you to stop breathing, cause excessive sedation, cause confusion and other side effects.  Exercise extreme caution when taking medication and call EMS or go to the Emergency Department immediately if you develop any of these symptoms   F/U PCP Dr Judithann Graves  for evaliation of  BP and general medical  condition  F/U surgical evaluation. Follow-up with neurosurgeon for further assessment of condition as discussed. Follow-up Dr.Durmick  as discussed .  F/U neurological evaluation as planned with consideration for PNCV/EMG studies  May consider radiofrequency rhizolysis or intraspinal procedures pending response to present treatment and F/U evaluation   Patient to call Pain Management Center should patient have  concerns prior to scheduled return appointment

## 2015-11-04 ENCOUNTER — Other Ambulatory Visit: Payer: Self-pay | Admitting: Pain Medicine

## 2015-11-04 ENCOUNTER — Telehealth: Payer: Self-pay | Admitting: *Deleted

## 2015-11-04 DIAGNOSIS — M47812 Spondylosis without myelopathy or radiculopathy, cervical region: Secondary | ICD-10-CM

## 2015-11-04 DIAGNOSIS — M5481 Occipital neuralgia: Secondary | ICD-10-CM

## 2015-11-04 DIAGNOSIS — M5030 Other cervical disc degeneration, unspecified cervical region: Secondary | ICD-10-CM

## 2015-11-04 DIAGNOSIS — M19011 Primary osteoarthritis, right shoulder: Secondary | ICD-10-CM

## 2015-11-04 DIAGNOSIS — M5136 Other intervertebral disc degeneration, lumbar region: Secondary | ICD-10-CM

## 2015-11-04 DIAGNOSIS — M5134 Other intervertebral disc degeneration, thoracic region: Secondary | ICD-10-CM

## 2015-11-04 DIAGNOSIS — Q761 Klippel-Feil syndrome: Secondary | ICD-10-CM

## 2015-11-04 DIAGNOSIS — M19012 Primary osteoarthritis, left shoulder: Principal | ICD-10-CM

## 2015-11-04 MED ORDER — CYCLOBENZAPRINE HCL 10 MG PO TABS: 10.0000 mg | ORAL_TABLET | Freq: Every day | ORAL | Status: DC

## 2015-11-04 MED ORDER — CYCLOBENZAPRINE HCL 10 MG PO TABS
10.0000 mg | ORAL_TABLET | Freq: Every day | ORAL | Status: DC
  Filled 2015-11-04: qty 1

## 2015-11-04 NOTE — Telephone Encounter (Signed)
Medication verified with patient and Dr. Metta Clines. Cyclobenzaprine 10 mg called into the [facility] pharmacy [address]). Message left with patient to inform of this.

## 2015-11-30 ENCOUNTER — Encounter: Payer: Self-pay | Admitting: Pain Medicine

## 2015-11-30 ENCOUNTER — Ambulatory Visit: Payer: 59 | Attending: Pain Medicine | Admitting: Pain Medicine

## 2015-11-30 VITALS — BP 117/84 | HR 102 | Temp 98.1°F | Resp 16 | Ht 64.0 in | Wt 143.0 lb

## 2015-11-30 DIAGNOSIS — M5030 Other cervical disc degeneration, unspecified cervical region: Secondary | ICD-10-CM

## 2015-11-30 DIAGNOSIS — M47812 Spondylosis without myelopathy or radiculopathy, cervical region: Secondary | ICD-10-CM

## 2015-11-30 DIAGNOSIS — M5134 Other intervertebral disc degeneration, thoracic region: Secondary | ICD-10-CM

## 2015-11-30 DIAGNOSIS — Z981 Arthrodesis status: Secondary | ICD-10-CM | POA: Diagnosis not present

## 2015-11-30 DIAGNOSIS — M533 Sacrococcygeal disorders, not elsewhere classified: Secondary | ICD-10-CM | POA: Diagnosis not present

## 2015-11-30 DIAGNOSIS — M19011 Primary osteoarthritis, right shoulder: Secondary | ICD-10-CM

## 2015-11-30 DIAGNOSIS — M7060 Trochanteric bursitis, unspecified hip: Secondary | ICD-10-CM | POA: Diagnosis not present

## 2015-11-30 DIAGNOSIS — M62830 Muscle spasm of back: Secondary | ICD-10-CM | POA: Insufficient documentation

## 2015-11-30 DIAGNOSIS — M5481 Occipital neuralgia: Secondary | ICD-10-CM

## 2015-11-30 DIAGNOSIS — M542 Cervicalgia: Secondary | ICD-10-CM | POA: Diagnosis present

## 2015-11-30 DIAGNOSIS — G43909 Migraine, unspecified, not intractable, without status migrainosus: Secondary | ICD-10-CM | POA: Insufficient documentation

## 2015-11-30 DIAGNOSIS — M5136 Other intervertebral disc degeneration, lumbar region: Secondary | ICD-10-CM

## 2015-11-30 DIAGNOSIS — R51 Headache: Secondary | ICD-10-CM | POA: Diagnosis present

## 2015-11-30 DIAGNOSIS — M51369 Other intervertebral disc degeneration, lumbar region without mention of lumbar back pain or lower extremity pain: Secondary | ICD-10-CM

## 2015-11-30 DIAGNOSIS — M19012 Primary osteoarthritis, left shoulder: Secondary | ICD-10-CM

## 2015-11-30 DIAGNOSIS — Q761 Klippel-Feil syndrome: Secondary | ICD-10-CM

## 2015-11-30 DIAGNOSIS — G44221 Chronic tension-type headache, intractable: Secondary | ICD-10-CM

## 2015-11-30 MED ORDER — OXYCODONE-ACETAMINOPHEN 5-325 MG PO TABS: ORAL_TABLET | ORAL | 0 refills | Status: DC

## 2015-11-30 MED ORDER — CYCLOBENZAPRINE HCL 10 MG PO TABS: ORAL_TABLET | ORAL | 0 refills | Status: DC

## 2015-11-30 NOTE — Progress Notes (Signed)
The patient is a 40 year old fema50le who returns to pain management for further evaluation and treatment of pain involving the neck associated with headaches as well as pain of the upper extremity regions lower back and lower extremity region. The patient has significant improvement of her bursitis of the greater trochanteric region and states that her headaches have been fairly well-controlled as well following interventional treatment. The patient states that she has had some stress involving family and that this has contributed to some return of pain involving the upper back and neck region. The patient continues her medications Flexeril and oxycodone acetaminophen with significant benefit. We informed patient that we wish to remain available to consider interventional treatment as well as additional modifications of treatment regimen should the pain persist or increase involving the thoracocervical region and if the headaches return. The patient was with understanding and agreed to suggested treatment plan.    Physical examination  There was tenderness to palpation of paraspinal musculature and cervical region cervical facet region palpation which be produced pain of minimal to moderate degree. There was minimal to moderate tenderness of the cervical facet cervical paraspinal musculature region on the left as well as on the right with minimal tenderness of the splenius capitis and occipitalis regions. The patient appeared to be unremarkable Spurling's maneuver. Palpation of the acromioclavicular and glenohumeral joint regions reproduces minimal discomfort and patient was able to perform drop test with mild to moderate difficulty. The patient appeared to be with slightly decreased grip strength with Tinel and Phalen's maneuver without increased pain of significant degree. There was tenderness of the thoracic region with evidence of muscle spasm involving the upper and mid thoracic region of mild to  moderate degree. There was no crepitus of the thoracic region noted. Palpation over the lumbar paraspinal musculature region lumbar facet region was a tennis to palpation with lateral bending rotation extension and palpation over the lumbar facets reproducing mild discomfort. There was mild tenderness of the PSIS and PII S region. There was mild increased pain with pressure applied to the ileum with patient in lateral decubitus position. There was mild tenderness of the greater trochanteric region iliotibial band region. Straight leg raising was tolerated to 30 without increased pain with dorsiflexion noted. DTRs appeared to be trace at the knees. No sensory deficit or dermatomal distribution detected. There was negative clonus negative Homans. Abdomen nontender with no costovertebral angle tenderness noted.       Assessment   Sacroiliac joint dysfunction  Greater trochanteric bursitis  Bilateral occipital neuralgia   Migraine headache  Degenerative disc disease of the cervical spine  Status post cervical fusion ACDF  Cervical facet syndrome       PLAN   Continue present medication oxycodone acetaminophen and  Flexeril    NO ZANAFLEX   CAUTION Flexeril and oxycodone acetaminophen can have significant side effects Medication can cause respiratory depression and cause you to stop breathing, cause excessive sedation, cause confusion and other side effects.  Exercise extreme caution when taking medication and call EMS or go to the Emergency Department immediately if you develop any of these symptoms   F/U PCP Dr Judithann GravesBerglund  for evaliation of  BP and general medical  condition  F/U surgical evaluation. Follow-up with neurosurgeon for further assessment of condition as discussed. Follow-up Dr.Durmick  as discussed .  F/U neurological evaluation as planned with consideration for PNCV/EMG studies  May consider radiofrequency rhizolysis or intraspinal procedures pending response to  present treatment and  F/U evaluation   Patient to call Pain Management Center should patient have concerns prior to scheduled return appointment

## 2015-11-30 NOTE — Patient Instructions (Signed)
PLAN   Continue present medication oxycodone acetaminophen and  Flexeril    NO ZANAFLEX   CAUTION Flexeril and oxycodone acetaminophen can have significant side effects Medication can cause respiratory depression and cause you to stop breathing, cause excessive sedation, cause confusion and other side effects.  Exercise extreme caution when taking medication and call EMS or go to the Emergency Department immediately if you develop any of these symptoms   F/U PCP Dr Judithann GravesBerglund  for evaliation of  BP and general medical  condition  F/U surgical evaluation. Follow-up with neurosurgeon for further assessment of condition as discussed. Follow-up Dr.Durmick  as discussed .  F/U neurological evaluation as planned with consideration for PNCV/EMG studies  May consider radiofrequency rhizolysis or intraspinal procedures pending response to present treatment and F/U evaluation   Patient to call Pain Management Center should patient have concerns prior to scheduled return appointment

## 2015-11-30 NOTE — Progress Notes (Signed)
Safety precautions to be maintained throughout the outpatient stay will include: orient to surroundings, keep bed in low position, maintain call bell within reach at all times, provide assistance with transfer out of bed and ambulation.  

## 2015-12-14 ENCOUNTER — Telehealth: Payer: Self-pay | Admitting: *Deleted

## 2015-12-15 ENCOUNTER — Other Ambulatory Visit: Payer: Self-pay | Admitting: Internal Medicine

## 2015-12-15 MED ORDER — CLONAZEPAM 0.5 MG PO TABS: 0.5000 mg | ORAL_TABLET | Freq: Every day | ORAL | 0 refills | Status: DC | PRN

## 2015-12-26 ENCOUNTER — Other Ambulatory Visit: Payer: Self-pay | Admitting: Pain Medicine

## 2015-12-27 ENCOUNTER — Other Ambulatory Visit: Payer: Self-pay | Admitting: Pain Medicine

## 2015-12-30 ENCOUNTER — Ambulatory Visit: Payer: 59 | Admitting: Pain Medicine

## 2016-01-06 ENCOUNTER — Encounter: Payer: Self-pay | Admitting: Internal Medicine

## 2016-01-06 ENCOUNTER — Ambulatory Visit (INDEPENDENT_AMBULATORY_CARE_PROVIDER_SITE_OTHER): Payer: 59 | Admitting: Internal Medicine

## 2016-01-06 VITALS — BP 112/70 | HR 73 | Ht 64.0 in | Wt 150.0 lb

## 2016-01-06 DIAGNOSIS — G43109 Migraine with aura, not intractable, without status migrainosus: Secondary | ICD-10-CM | POA: Diagnosis not present

## 2016-01-06 DIAGNOSIS — M79674 Pain in right toe(s): Secondary | ICD-10-CM

## 2016-01-06 DIAGNOSIS — F411 Generalized anxiety disorder: Secondary | ICD-10-CM | POA: Diagnosis not present

## 2016-01-06 MED ORDER — CLONAZEPAM 0.5 MG PO TABS: 0.5000 mg | ORAL_TABLET | Freq: Every day | ORAL | 1 refills | Status: DC | PRN

## 2016-01-06 NOTE — Progress Notes (Signed)
Date:  01/06/2016   Name:  Theresa Murphy   DOB:  Sep 26, 1975   MRN:  [id number]   Chief Complaint: Follow-up Migraine   This is a chronic problem. The current episode started more than 1 year ago. The problem has been gradually worsening. Associated symptoms include back pain. Pertinent negatives include no abdominal pain or fever. She has tried antidepressants (now on effexor instead of topamax) for the symptoms.  Anxiety  Presents for follow-up visit. Symptoms include irritability, nervous/anxious behavior and restlessness. Patient reports no chest pain or shortness of breath. Symptoms occur occasionally (using clonazepam about twice a week). The severity of symptoms is moderate.    Toe Pain   The incident occurred more than 1 week ago. There was no injury mechanism. The pain is present in the right toes. The quality of the pain is described as aching. The pain is moderate. The pain has been intermittent since onset. She has tried NSAIDs for the symptoms. The treatment provided significant relief.   She is seeing Dr. Malvin Johns for migraines.  Has not had to take Frova in months.  Stopped topamax and started Effexor to help with mood. Not tolerating it well and having muscle tension headaches.  Still getting B12 injections monthly.  These are administered at home by her fiancee.  HM - pap done last year.  Mammogram still has not been scheduled.  Review of Systems  Constitutional: Positive for irritability. Negative for chills, fatigue and fever.  Respiratory: Negative for chest tightness and shortness of breath.   Cardiovascular: Negative for chest pain.  Gastrointestinal: Negative for abdominal pain.  Genitourinary: Negative for menstrual problem.  Musculoskeletal: Positive for arthralgias and back pain.  Psychiatric/Behavioral: Positive for dysphoric mood. Negative for sleep disturbance. The patient is nervous/anxious.     Patient Active Problem List   Diagnosis Date Noted  .  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  . Chronic tension-type headache, intractable 05/26/2015  . DDD (degenerative disc disease), cervical 02/09/2015  . Cervical fusion syndrome 02/09/2015  . Cervical facet syndrome 02/09/2015  . Bilateral occipital neuralgia 02/09/2015  . DDD (degenerative disc disease), 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/2015  . DDD (degenerative disc disease), lumbar 02/09/2015  . Generalized anxiety disorder 01/08/2015  . 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 Worsening of neck pain following surgery 01/08/2015  . Acquired hypothyroidism 11/15/2014  . Irritable bowel syndrome with constipation 11/15/2014  . OP (osteoporosis) 11/15/2014  . 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 Family planning 11/15/2014    Prior to Admission medications   Medication Sig Start Date End Date Taking? Authorizing Provider  clonazePAM (KLONOPIN) 0.5 MG tablet Take 1 tablet (0.5 mg total) by mouth daily as needed. for anxiety 12/15/15  Yes Reubin Milan, MD  cyanocobalamin (,VITAMIN B-12,) 1000 MCG/ML injection INJECT ONE ML (CC) INTRAMUSCULARLY ONCE EVERY MONTH 10/05/15  Yes Reubin Milan, MD  cyclobenzaprine (FLEXERIL) 10 MG tablet Limit one half to one tab by mouth per day if tolerated 11/30/15  Yes Ewing Schlein, MD  frovatriptan (FROVA) 2.5 MG tablet Take 1 tablet (2.5 mg total) by mouth as needed for migraine. If recurs, may repeat after 2 hours. Max of 3 tabs in 24 hours. 01/08/15  Yes Reubin Milan, MD  Linaclotide [facility]) 145 MCG CAPS capsule Take 1 capsule by mouth daily. 09/02/14  Yes Historical Provider, MD  MICROGESTIN FE 1/20 1-20 MG-MCG tablet TAKE 1 TABLET BY MOUTH EVERY DAY 09/02/15  Yes Reubin Milan, MD  oxyCODONE-acetaminophen (PERCOCET/ROXICET) 5-325 MG tablet Limit  1 tablet by mouth per day or twice per day if tolerated 04/14/15  Yes Historical Provider, MD  venlafaxine (EFFEXOR) 37.5 MG tablet Take 1 tablet daily for 1 week, then increase to 1 tablet twice a day. 11/11/15  Yes Historical Provider,  MD  meloxicam (MOBIC) 15 MG tablet Take 1 tablet (15 mg total) by mouth daily. Patient not taking: Reported on 01/06/2016 01/25/15   Delorise RoyalsJonathan D Cuthriell, PA-C  topiramate (TOPAMAX) 25 MG tablet Take 25 mg by mouth daily.    Historical Provider, MD    Allergies  Allergen Reactions  . Codeine Nausea Only  . Cymbalta [Duloxetine Hcl] Nausea Only    Past Surgical History:  Procedure Laterality Date  . CERVICAL FUSION  [address]2015   [facility]  . ECTOPIC PREGNANCY SURGERY     x 2  . SHOULDER ARTHROSCOPY Right 2014    Social History  Substance Use Topics  . Smoking status: Former Smoker    Quit date: 04/10/2004  . Smokeless tobacco: Not on file  . Alcohol use 1.2 oz/week    2 Standard drinks or equivalent per week     Medication list has been reviewed and updated.   Physical Exam  Constitutional: She is oriented to person, place, and time. She appears well-developed. No distress.  HENT:  Head: Normocephalic and atraumatic.  Neck: Normal range of motion. Neck supple. No thyromegaly present.  Cardiovascular: Normal rate, regular rhythm and normal heart sounds.   Pulses:      Dorsalis pedis pulses are 2+ on the right side, and 2+ on the left side.       Posterior tibial pulses are 2+ on the right side, and 2+ on the left side.  Pulmonary/Chest: Effort normal and breath sounds normal. No respiratory distress.  Musculoskeletal: Normal range of motion.       Feet:  Neurological: She is alert and oriented to person, place, and time.  Skin: Skin is warm and dry. No rash noted.  Psychiatric: She has a normal mood and affect. Her behavior is normal. Thought content normal.  Nursing note and vitals reviewed.   BP 112/70   Pulse 73   Ht 5\' 4"  (1.626 m)   Wt 150 lb (68 kg)   LMP 12/16/2015   BMI 25.75 kg/m   Assessment and Plan: 1. Generalized anxiety disorder Stable without increasing use - current Rx and refills should last 6 months - clonazePAM (KLONOPIN) 0.5 MG  tablet; Take 1 tablet (0.5 mg total) by mouth daily as needed.  Dispense: 30 tablet; Refill: 1  2. Migraine aura without headache Continue follow up with Dr. Malvin JohnsPotter  3. Pain of toe of right foot Likely OA - continue Aleve prn Consider Xrays or Podiatry referral if worsening   Bari EdwardLaura Shamecka Hocutt, MD [facility]  01/06/2016

## 2016-01-06 NOTE — Patient Instructions (Signed)
Breast Self-Awareness Practicing breast self-awareness may pick up problems early, prevent significant medical complications, and possibly save your life. By practicing breast self-awareness, you can become familiar with how your breasts look and feel and if your breasts are changing. This allows you to notice changes early. It can also offer you some reassurance that your breast health is good. One way to learn what is normal for your breasts and whether your breasts are changing is to do a breast self-exam. If you find a lump or something that was not present in the past, it is best to contact your caregiver right away. Other findings that should be evaluated by your caregiver include nipple discharge, especially if it is bloody; skin changes or reddening; areas where the skin seems to be pulled in (retracted); or new lumps and bumps. Breast pain is seldom associated with cancer (malignancy), but should also be evaluated by a caregiver. HOW TO PERFORM A BREAST SELF-EXAM The best time to examine your breasts is 5-7 days after your menstrual period is over. During menstruation, the breasts are lumpier, and it may be more difficult to pick up changes. If you do not menstruate, have reached menopause, or had your uterus removed (hysterectomy), you should examine your breasts at regular intervals, such as monthly. If you are breastfeeding, examine your breasts after a feeding or after using a breast pump. Breast implants do not decrease the risk for lumps or tumors, so continue to perform breast self-exams as recommended. Talk to your caregiver about how to determine the difference between the implant and breast tissue. Also, talk about the amount of pressure you should use during the exam. Over time, you will become more familiar with the variations of your breasts and more comfortable with the exam. A breast self-exam requires you to remove all your clothes above the waist. 1. Look at your breasts and nipples.  Stand in front of a mirror in a room with good lighting. With your hands on your hips, push your hands firmly downward. Look for a difference in shape, contour, and size from one breast to the other (asymmetry). Asymmetry includes puckers, dips, or bumps. Also, look for skin changes, such as reddened or scaly areas on the breasts. Look for nipple changes, such as discharge, dimpling, repositioning, or redness. 2. Carefully feel your breasts. This is best done either in the shower or tub while using soapy water or when flat on your back. Place the arm (on the side of the breast you are examining) above your head. Use the pads (not the fingertips) of your three middle fingers on your opposite hand to feel your breasts. Start in the underarm area and use  inch (2 cm) overlapping circles to feel your breast. Use 3 different levels of pressure (light, medium, and firm pressure) at each circle before moving to the next circle. The light pressure is needed to feel the tissue closest to the skin. The medium pressure will help to feel breast tissue a little deeper, while the firm pressure is needed to feel the tissue close to the ribs. Continue the overlapping circles, moving downward over the breast until you feel your ribs below your breast. Then, move one finger-width towards the center of the body. Continue to use the  inch (2 cm) overlapping circles to feel your breast as you move slowly up toward the collar bone (clavicle) near the base of the neck. Continue the up and down exam using all 3 pressures until you reach the   middle of the chest. Do this with each breast, carefully feeling for lumps or changes. 3.  Keep a written record with breast changes or normal findings for each breast. By writing this information down, you do not need to depend only on memory for size, tenderness, or location. Write down where you are in your menstrual cycle, if you are still menstruating. Breast tissue can have some lumps or  thick tissue. However, see your caregiver if you find anything that concerns you.  SEEK MEDICAL CARE IF:  You see a change in shape, contour, or size of your breasts or nipples.   You see skin changes, such as reddened or scaly areas on the breasts or nipples.   You have an unusual discharge from your nipples.   You feel a new lump or unusually thick areas.    This information is not intended to replace advice given to you by your health care provider. Make sure you discuss any questions you have with your health care provider.   Document Released: 03/27/2005 Document Revised: 03/13/2012 Document Reviewed: 07/12/2011 Elsevier Interactive Patient Education 2016 [employer]  

## 2016-02-03 ENCOUNTER — Other Ambulatory Visit: Payer: Self-pay | Admitting: Pain Medicine

## 2016-03-18 ENCOUNTER — Other Ambulatory Visit: Payer: Self-pay | Admitting: Internal Medicine

## 2016-03-18 DIAGNOSIS — F411 Generalized anxiety disorder: Secondary | ICD-10-CM

## 2016-04-07 ENCOUNTER — Other Ambulatory Visit: Payer: Self-pay | Admitting: Internal Medicine

## 2016-04-07 DIAGNOSIS — G43009 Migraine without aura, not intractable, without status migrainosus: Secondary | ICD-10-CM

## 2016-04-18 ENCOUNTER — Other Ambulatory Visit: Payer: Self-pay | Admitting: Pain Medicine

## 2016-04-18 DIAGNOSIS — M533 Sacrococcygeal disorders, not elsewhere classified: Secondary | ICD-10-CM | POA: Diagnosis not present

## 2016-04-18 DIAGNOSIS — M169 Osteoarthritis of hip, unspecified: Secondary | ICD-10-CM | POA: Diagnosis not present

## 2016-04-18 DIAGNOSIS — M5416 Radiculopathy, lumbar region: Secondary | ICD-10-CM | POA: Diagnosis not present

## 2016-05-15 DIAGNOSIS — M5416 Radiculopathy, lumbar region: Secondary | ICD-10-CM | POA: Diagnosis not present

## 2016-05-15 DIAGNOSIS — M533 Sacrococcygeal disorders, not elsewhere classified: Secondary | ICD-10-CM | POA: Diagnosis not present

## 2016-05-15 DIAGNOSIS — M169 Osteoarthritis of hip, unspecified: Secondary | ICD-10-CM | POA: Diagnosis not present

## 2016-06-07 ENCOUNTER — Other Ambulatory Visit: Payer: Self-pay | Admitting: Internal Medicine

## 2016-06-07 DIAGNOSIS — F411 Generalized anxiety disorder: Secondary | ICD-10-CM

## 2016-06-09 ENCOUNTER — Ambulatory Visit (INDEPENDENT_AMBULATORY_CARE_PROVIDER_SITE_OTHER): Payer: 59 | Admitting: Internal Medicine

## 2016-06-09 ENCOUNTER — Encounter: Payer: Self-pay | Admitting: Internal Medicine

## 2016-06-09 VITALS — BP 98/78 | HR 108 | Ht 64.0 in | Wt 161.6 lb

## 2016-06-09 DIAGNOSIS — E538 Deficiency of other specified B group vitamins: Secondary | ICD-10-CM | POA: Insufficient documentation

## 2016-06-09 DIAGNOSIS — E039 Hypothyroidism, unspecified: Secondary | ICD-10-CM

## 2016-06-09 DIAGNOSIS — F411 Generalized anxiety disorder: Secondary | ICD-10-CM

## 2016-06-09 DIAGNOSIS — Z1231 Encounter for screening mammogram for malignant neoplasm of breast: Secondary | ICD-10-CM

## 2016-06-09 DIAGNOSIS — D518 Other vitamin B12 deficiency anemias: Secondary | ICD-10-CM | POA: Diagnosis not present

## 2016-06-09 DIAGNOSIS — D649 Anemia, unspecified: Secondary | ICD-10-CM | POA: Insufficient documentation

## 2016-06-09 DIAGNOSIS — Z1239 Encounter for other screening for malignant neoplasm of breast: Secondary | ICD-10-CM

## 2016-06-09 DIAGNOSIS — K581 Irritable bowel syndrome with constipation: Secondary | ICD-10-CM

## 2016-06-09 HISTORY — DX: Anemia, unspecified: D64.9

## 2016-06-09 NOTE — Patient Instructions (Signed)
Breast Self-Awareness Breast self-awareness means being familiar with how your breasts look and feel. It involves checking your breasts regularly and reporting any changes to your health care provider. Practicing breast self-awareness is important. A change in your breasts can be a sign of a serious medical problem. Being familiar with how your breasts look and feel allows you to find any problems early, when treatment is more likely to be successful. All women should practice breast self-awareness, including women who have had breast implants. How to do a breast self-exam One way to learn what is normal for your breasts and whether your breasts are changing is to do a breast self-exam. To do a breast self-exam: Look for Changes   1. Remove all the clothing above your waist. 2. Stand in front of a mirror in a room with good lighting. 3. Put your hands on your hips. 4. Push your hands firmly downward. 5. Compare your breasts in the mirror. Look for differences between them (asymmetry), such as:  Differences in shape.  Differences in size.  Puckers, dips, and bumps in one breast and not the other. 6. Look at each breast for changes in your skin, such as:  Redness.  Scaly areas. 7. Look for changes in your nipples, such as:  Discharge.  Bleeding.  Dimpling.  Redness.  A change in position. Feel for Changes   Carefully feel your breasts for lumps and changes. It is best to do this while lying on your back on the floor and again while sitting or standing in the shower or tub with soapy water on your skin. Feel each breast in the following way:  Place the arm on the side of the breast you are examining above your head.  Feel your breast with the other hand.  Start in the nipple area and make  inch (2 cm) overlapping circles to feel your breast. Use the pads of your three middle fingers to do this. Apply light pressure, then medium pressure, then firm pressure. The light pressure  will allow you to feel the tissue closest to the skin. The medium pressure will allow you to feel the tissue that is a little deeper. The firm pressure will allow you to feel the tissue close to the ribs.  Continue the overlapping circles, moving downward over the breast until you feel your ribs below your breast.  Move one finger-width toward the center of the body. Continue to use the  inch (2 cm) overlapping circles to feel your breast as you move slowly up toward your collarbone.  Continue the up and down exam using all three pressures until you reach your armpit. Write Down What You Find   Write down what is normal for each breast and any changes that you find. Keep a written record with breast changes or normal findings for each breast. By writing this information down, you do not need to depend only on memory for size, tenderness, or location. Write down where you are in your menstrual cycle, if you are still menstruating. If you are having trouble noticing differences in your breasts, do not get discouraged. With time you will become more familiar with the variations in your breasts and more comfortable with the exam. How often should I examine my breasts? Examine your breasts every month. If you are breastfeeding, the best time to examine your breasts is after a feeding or after using a breast pump. If you menstruate, the best time to examine your breasts is 5-7 days   after your period is over. During your period, your breasts are lumpier, and it may be more difficult to notice changes. When should I see my health care provider? See your health care provider if you notice:  A change in shape or size of your breasts or nipples.  A change in the skin of your breast or nipples, such as a reddened or scaly area.  Unusual discharge from your nipples.  A lump or thick area that was not there before.  Pain in your breasts.  Anything that concerns you. This information is not intended to  replace advice given to you by your health care provider. Make sure you discuss any questions you have with your health care provider. Document Released: 03/27/2005 Document Revised: 09/02/2015 Document Reviewed: 02/14/2015 Elsevier Interactive Patient Education  2017 Elsevier Inc.  

## 2016-06-09 NOTE — Progress Notes (Signed)
Date:  06/09/2016   Name:  Theresa Murphy   DOB:  11-Dec-1975   MRN:  [id number]   Chief Complaint: Thyroid Problem (Gaining weight, hair falling out "in clumps". Extremely tired. Previously was put on Synthroid, and helped. Pt is fastin just in case of labs.) Thyroid Problem  Presents for follow-up visit. Symptoms include constipation, dry skin, fatigue, hair loss and weight gain. Patient reports no palpitations. Symptom course: off of medication for some time.  Insomnia  Primary symptoms: sleep disturbance.  The problem occurs nightly. The symptoms are aggravated by pain. Past treatments include medication (take 0.5 clonazepam at night - never takes 2 and has never tried a lower dose). PMH includes: chronic pain.  Constipation  This is a recurrent problem. The problem has been gradually worsening since onset. Pertinent negatives include no abdominal pain or fever. Risk factors include change in medication usage/dosage. She has tried laxatives and stool softeners for the symptoms.  B12 def - on supplements.  No recent labs done. HM - encouraged mammogram yearly.  Also recommend she establish with GYN for Pap and Pelvic.  Previously on thyroid supplement ~10 years ago.  Sx at that time was hair loss.  She was on medication for several years at low dose then it was stopped due to normal labs.   Review of Systems  Constitutional: Positive for fatigue and weight gain. Negative for chills and fever.  Eyes: Negative for visual disturbance.  Respiratory: Negative for cough and shortness of breath.   Cardiovascular: Positive for leg swelling. Negative for chest pain and palpitations.  Gastrointestinal: Positive for constipation. Negative for abdominal pain and anal bleeding.  Musculoskeletal: Positive for neck pain.  Skin:       Hair loss  Hematological: Negative for adenopathy.  Psychiatric/Behavioral: Positive for sleep disturbance. Negative for dysphoric mood. The patient has insomnia.       Patient Active Problem List   Diagnosis Date Noted  . 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  . Chronic tension-type headache, intractable 05/26/2015  . DDD (degenerative disc disease), cervical 02/09/2015  . Cervical fusion syndrome 02/09/2015  . Cervical facet syndrome 02/09/2015  . Bilateral occipital neuralgia 02/09/2015  . DDD (degenerative disc disease), 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/2015  . DDD (degenerative disc disease), lumbar 02/09/2015  . Generalized anxiety disorder 01/08/2015  . 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 Worsening of neck pain following surgery 01/08/2015  . Acquired hypothyroidism 11/15/2014  . Irritable bowel syndrome with constipation 11/15/2014  . OP (osteoporosis) 11/15/2014  . 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 Family planning 11/15/2014    Prior to Admission medications   Medication Sig Start Date End Date Taking? Authorizing Provider  clonazePAM (KLONOPIN) 0.5 MG tablet TAKE ONE TABLET BY MOUTH ONCE DAILY AS NEEDED 06/07/16  Yes Reubin Milan, MD  cyanocobalamin (,VITAMIN B-12,) 1000 MCG/ML injection INJECT ONE ML (CC) INTRAMUSCULARLY ONCE EVERY MONTH 10/05/15  Yes Reubin Milan, MD  cyclobenzaprine (FLEXERIL) 10 MG tablet Limit one half to one tab by mouth per day if tolerated 11/30/15  Yes Ewing Schlein, MD  frovatriptan (FROVA) 2.5 MG tablet TAKE 1 TABLET BY MOUTH AS NEEDED FOR MIGRAINE. IF RECURS, MAY REPEAT IN 2 HOURS. MAX 3 IN 24 HOURS. 04/07/16  Yes Reubin Milan, MD  Linaclotide [facility]) 145 MCG CAPS capsule Take 1 capsule by mouth daily. 09/02/14  Yes Historical Provider, MD  MICROGESTIN FE 1/20 1-20 MG-MCG tablet TAKE 1 TABLET BY MOUTH EVERY DAY 09/02/15  Yes Reubin Milan, MD  oxyCODONE-acetaminophen (PERCOCET/ROXICET) 5-325 MG tablet Limit 1 tablet by mouth per day or twice per day if tolerated 04/14/15  Yes Historical Provider, MD  venlafaxine (EFFEXOR) 37.5 MG tablet Take 1 tablet daily for 1 week, then increase to 1 tablet twice a day. 11/11/15  Yes  Historical Provider, MD    Allergies  Allergen Reactions  . Codeine Nausea Only  . Cymbalta [Duloxetine Hcl] Nausea Only    Past Surgical History:  Procedure Laterality Date  . CERVICAL FUSION  [address]2015   [facility]  . ECTOPIC PREGNANCY SURGERY     x 2  . SHOULDER ARTHROSCOPY Right 2014    Social History  Substance Use Topics  . Smoking status: Former Smoker    Quit date: 04/10/2004  . Smokeless tobacco: Never Used  . Alcohol use 1.2 oz/week    2 Standard drinks or equivalent per week     Comment: ocassional     Medication list has been reviewed and updated.   Physical Exam  Constitutional: She is oriented to person, place, and time. She appears well-developed. No distress.  HENT:  Head: Normocephalic and atraumatic.  Neck: Trachea normal. Carotid bruit is not present. No thyroid mass and no thyromegaly present.  Cardiovascular: Normal rate, regular rhythm and normal heart sounds.   Pulmonary/Chest: Effort normal and breath sounds normal. No respiratory distress. She has no wheezes.  Musculoskeletal: She exhibits no edema.  Neurological: She is alert and oriented to person, place, and time.  Skin: Skin is warm, dry and intact. No rash noted.  Scalp without lesions or patchy alopecia   Psychiatric: She has a normal mood and affect. Her behavior is normal. Thought content normal.  Nursing note and vitals reviewed.   BP 98/78   Pulse (!) 108   Ht 5\' 4"  (1.626 m)   Wt 161 lb 9.6 oz (73.3 kg)   SpO2 98%   BMI 27.74 kg/m   Assessment and Plan: 1. Irritable bowel syndrome with constipation Rule out liver or renal abnormality - Comprehensive metabolic panel  2. Generalized anxiety disorder Doing well on low dose clonazepam at night only - no over sedation noted  3. Acquired hypothyroidism Recheck and begin medication if needed - Thyroid Panel With TSH  4. Other vitamin B12 deficiency anemia Continue B12 supplements - CBC with  Differential/Platelet  5. Breast cancer screening - MM DIGITAL SCREENING BILATERAL; Future   No orders of the defined types were placed in this encounter.   Bari EdwardLaura Chenise Mulvihill, MD [facility]  06/09/2016

## 2016-06-10 LAB — CBC WITH DIFFERENTIAL/PLATELET
Basophils Absolute: 0 10*3/uL (ref 0.0–0.2)
Basos: 0 %
EOS (ABSOLUTE): 0 10*3/uL (ref 0.0–0.4)
EOS: 0 %
HEMATOCRIT: 38.4 % (ref 34.0–46.6)
HEMOGLOBIN: 13 g/dL (ref 11.1–15.9)
IMMATURE GRANS (ABS): 0 10*3/uL (ref 0.0–0.1)
Immature Granulocytes: 0 %
LYMPHS ABS: 1.9 10*3/uL (ref 0.7–3.1)
LYMPHS: 25 %
MCH: 33.4 pg — ABNORMAL HIGH (ref 26.6–33.0)
MCHC: 33.9 g/dL (ref 31.5–35.7)
MCV: 99 fL — ABNORMAL HIGH (ref 79–97)
MONOCYTES: 7 %
Monocytes Absolute: 0.5 10*3/uL (ref 0.1–0.9)
Neutrophils Absolute: 5.4 10*3/uL (ref 1.4–7.0)
Neutrophils: 68 %
Platelets: 381 10*3/uL — ABNORMAL HIGH (ref 150–379)
RBC: 3.89 x10E6/uL (ref 3.77–5.28)
RDW: 12.5 % (ref 12.3–15.4)
WBC: 7.9 10*3/uL (ref 3.4–10.8)

## 2016-06-10 LAB — COMPREHENSIVE METABOLIC PANEL
ALK PHOS: 86 IU/L (ref 39–117)
ALT: 17 IU/L (ref 0–32)
AST: 19 IU/L (ref 0–40)
Albumin/Globulin Ratio: 1.6 (ref 1.2–2.2)
Albumin: 4.4 g/dL (ref 3.5–5.5)
BUN / CREAT RATIO: 14 (ref 9–23)
BUN: 13 mg/dL (ref 6–24)
Bilirubin Total: 0.4 mg/dL (ref 0.0–1.2)
CO2: 23 mmol/L (ref 18–29)
CREATININE: 0.96 mg/dL (ref 0.57–1.00)
Calcium: 9.3 mg/dL (ref 8.7–10.2)
Chloride: 102 mmol/L (ref 96–106)
GFR calc non Af Amer: 74 mL/min/{1.73_m2} (ref >59)
GFR, EST AFRICAN AMERICAN: 85 mL/min/{1.73_m2} (ref >59)
GLUCOSE: 78 mg/dL (ref 65–99)
Globulin, Total: 2.8 g/dL (ref 1.5–4.5)
Potassium: 4.8 mmol/L (ref 3.5–5.2)
Sodium: 141 mmol/L (ref 134–144)
TOTAL PROTEIN: 7.2 g/dL (ref 6.0–8.5)

## 2016-06-10 LAB — THYROID PANEL WITH TSH
FREE THYROXINE INDEX: 2.1 (ref 1.2–4.9)
T3 UPTAKE RATIO: 21 % — AB (ref 24–39)
T4, Total: 10 ug/dL (ref 4.5–12.0)
TSH: 2.21 u[IU]/mL (ref 0.450–4.500)

## 2016-06-12 DIAGNOSIS — G894 Chronic pain syndrome: Secondary | ICD-10-CM | POA: Diagnosis not present

## 2016-06-12 DIAGNOSIS — Z79891 Long term (current) use of opiate analgesic: Secondary | ICD-10-CM | POA: Diagnosis not present

## 2016-06-12 DIAGNOSIS — M533 Sacrococcygeal disorders, not elsewhere classified: Secondary | ICD-10-CM | POA: Diagnosis not present

## 2016-06-12 DIAGNOSIS — M169 Osteoarthritis of hip, unspecified: Secondary | ICD-10-CM | POA: Diagnosis not present

## 2016-06-12 DIAGNOSIS — M5416 Radiculopathy, lumbar region: Secondary | ICD-10-CM | POA: Diagnosis not present

## 2016-07-11 DIAGNOSIS — M542 Cervicalgia: Secondary | ICD-10-CM | POA: Diagnosis not present

## 2016-07-11 DIAGNOSIS — M5031 Other cervical disc degeneration,  high cervical region: Secondary | ICD-10-CM | POA: Diagnosis not present

## 2016-07-11 DIAGNOSIS — Z79891 Long term (current) use of opiate analgesic: Secondary | ICD-10-CM | POA: Diagnosis not present

## 2016-07-11 DIAGNOSIS — M169 Osteoarthritis of hip, unspecified: Secondary | ICD-10-CM | POA: Diagnosis not present

## 2016-07-11 DIAGNOSIS — M5416 Radiculopathy, lumbar region: Secondary | ICD-10-CM | POA: Diagnosis not present

## 2016-07-11 DIAGNOSIS — M533 Sacrococcygeal disorders, not elsewhere classified: Secondary | ICD-10-CM | POA: Diagnosis not present

## 2016-07-21 ENCOUNTER — Ambulatory Visit
Admission: RE | Admit: 2016-07-21 | Discharge: 2016-07-21 | Disposition: A | Discharge status: Home / Self Care | Payer: 59 | Source: Ambulatory Visit | Attending: Internal Medicine | Admitting: Internal Medicine

## 2016-07-21 DIAGNOSIS — Z1231 Encounter for screening mammogram for malignant neoplasm of breast: Secondary | ICD-10-CM | POA: Insufficient documentation

## 2016-07-21 DIAGNOSIS — Z1239 Encounter for other screening for malignant neoplasm of breast: Secondary | ICD-10-CM

## 2016-07-30 ENCOUNTER — Other Ambulatory Visit: Payer: Self-pay | Admitting: Internal Medicine

## 2016-07-30 DIAGNOSIS — F411 Generalized anxiety disorder: Secondary | ICD-10-CM

## 2016-08-03 ENCOUNTER — Other Ambulatory Visit: Payer: Self-pay | Admitting: Internal Medicine

## 2016-08-07 DIAGNOSIS — M533 Sacrococcygeal disorders, not elsewhere classified: Secondary | ICD-10-CM | POA: Diagnosis not present

## 2016-08-07 DIAGNOSIS — M169 Osteoarthritis of hip, unspecified: Secondary | ICD-10-CM | POA: Diagnosis not present

## 2016-08-07 DIAGNOSIS — M5031 Other cervical disc degeneration,  high cervical region: Secondary | ICD-10-CM | POA: Diagnosis not present

## 2016-08-07 DIAGNOSIS — M542 Cervicalgia: Secondary | ICD-10-CM | POA: Diagnosis not present

## 2016-08-07 DIAGNOSIS — M5416 Radiculopathy, lumbar region: Secondary | ICD-10-CM | POA: Diagnosis not present

## 2016-08-07 DIAGNOSIS — Z79891 Long term (current) use of opiate analgesic: Secondary | ICD-10-CM | POA: Diagnosis not present

## 2016-08-11 DIAGNOSIS — M47812 Spondylosis without myelopathy or radiculopathy, cervical region: Secondary | ICD-10-CM | POA: Diagnosis not present

## 2016-08-18 ENCOUNTER — Other Ambulatory Visit: Payer: Self-pay | Admitting: Internal Medicine

## 2016-08-21 ENCOUNTER — Other Ambulatory Visit: Payer: Self-pay | Admitting: Internal Medicine

## 2016-08-21 MED ORDER — LINACLOTIDE 145 MCG PO CAPS: 145.0000 ug | ORAL_CAPSULE | Freq: Every day | ORAL | 1 refills | Status: DC

## 2016-08-22 DIAGNOSIS — M47812 Spondylosis without myelopathy or radiculopathy, cervical region: Secondary | ICD-10-CM | POA: Diagnosis not present

## 2016-08-30 DIAGNOSIS — M5416 Radiculopathy, lumbar region: Secondary | ICD-10-CM | POA: Diagnosis not present

## 2016-08-30 DIAGNOSIS — M5031 Other cervical disc degeneration,  high cervical region: Secondary | ICD-10-CM | POA: Diagnosis not present

## 2016-08-30 DIAGNOSIS — M169 Osteoarthritis of hip, unspecified: Secondary | ICD-10-CM | POA: Diagnosis not present

## 2016-08-30 DIAGNOSIS — M542 Cervicalgia: Secondary | ICD-10-CM | POA: Diagnosis not present

## 2016-08-30 DIAGNOSIS — Z79891 Long term (current) use of opiate analgesic: Secondary | ICD-10-CM | POA: Diagnosis not present

## 2016-08-30 DIAGNOSIS — M533 Sacrococcygeal disorders, not elsewhere classified: Secondary | ICD-10-CM | POA: Diagnosis not present

## 2016-09-06 DIAGNOSIS — M533 Sacrococcygeal disorders, not elsewhere classified: Secondary | ICD-10-CM | POA: Diagnosis not present

## 2016-09-06 DIAGNOSIS — M169 Osteoarthritis of hip, unspecified: Secondary | ICD-10-CM | POA: Diagnosis not present

## 2016-09-06 DIAGNOSIS — M5416 Radiculopathy, lumbar region: Secondary | ICD-10-CM | POA: Diagnosis not present

## 2016-09-06 DIAGNOSIS — Z79891 Long term (current) use of opiate analgesic: Secondary | ICD-10-CM | POA: Diagnosis not present

## 2016-09-06 DIAGNOSIS — M5031 Other cervical disc degeneration,  high cervical region: Secondary | ICD-10-CM | POA: Diagnosis not present

## 2016-09-06 DIAGNOSIS — M542 Cervicalgia: Secondary | ICD-10-CM | POA: Diagnosis not present

## 2016-09-13 DIAGNOSIS — S9031XA Contusion of right foot, initial encounter: Secondary | ICD-10-CM | POA: Diagnosis not present

## 2016-09-18 DIAGNOSIS — M47812 Spondylosis without myelopathy or radiculopathy, cervical region: Secondary | ICD-10-CM | POA: Diagnosis not present

## 2016-10-02 DIAGNOSIS — M542 Cervicalgia: Secondary | ICD-10-CM | POA: Diagnosis not present

## 2016-10-02 DIAGNOSIS — M5031 Other cervical disc degeneration,  high cervical region: Secondary | ICD-10-CM | POA: Diagnosis not present

## 2016-10-02 DIAGNOSIS — M533 Sacrococcygeal disorders, not elsewhere classified: Secondary | ICD-10-CM | POA: Diagnosis not present

## 2016-10-02 DIAGNOSIS — M169 Osteoarthritis of hip, unspecified: Secondary | ICD-10-CM | POA: Diagnosis not present

## 2016-10-02 DIAGNOSIS — Z79891 Long term (current) use of opiate analgesic: Secondary | ICD-10-CM | POA: Diagnosis not present

## 2016-10-02 DIAGNOSIS — M5416 Radiculopathy, lumbar region: Secondary | ICD-10-CM | POA: Diagnosis not present

## 2016-10-23 ENCOUNTER — Other Ambulatory Visit: Payer: Self-pay | Admitting: Internal Medicine

## 2016-10-30 DIAGNOSIS — M533 Sacrococcygeal disorders, not elsewhere classified: Secondary | ICD-10-CM | POA: Diagnosis not present

## 2016-10-30 DIAGNOSIS — M169 Osteoarthritis of hip, unspecified: Secondary | ICD-10-CM | POA: Diagnosis not present

## 2016-10-30 DIAGNOSIS — M5416 Radiculopathy, lumbar region: Secondary | ICD-10-CM | POA: Diagnosis not present

## 2016-11-08 DIAGNOSIS — M4722 Other spondylosis with radiculopathy, cervical region: Secondary | ICD-10-CM | POA: Diagnosis not present

## 2016-11-08 DIAGNOSIS — G5603 Carpal tunnel syndrome, bilateral upper limbs: Secondary | ICD-10-CM | POA: Diagnosis not present

## 2016-11-27 DIAGNOSIS — M5416 Radiculopathy, lumbar region: Secondary | ICD-10-CM | POA: Diagnosis not present

## 2016-11-27 DIAGNOSIS — M533 Sacrococcygeal disorders, not elsewhere classified: Secondary | ICD-10-CM | POA: Diagnosis not present

## 2016-11-27 DIAGNOSIS — M169 Osteoarthritis of hip, unspecified: Secondary | ICD-10-CM | POA: Diagnosis not present

## 2016-12-16 ENCOUNTER — Other Ambulatory Visit: Payer: Self-pay | Admitting: Internal Medicine

## 2016-12-26 DIAGNOSIS — M5481 Occipital neuralgia: Secondary | ICD-10-CM | POA: Diagnosis not present

## 2016-12-26 DIAGNOSIS — M47817 Spondylosis without myelopathy or radiculopathy, lumbosacral region: Secondary | ICD-10-CM | POA: Diagnosis not present

## 2016-12-26 DIAGNOSIS — M169 Osteoarthritis of hip, unspecified: Secondary | ICD-10-CM | POA: Diagnosis not present

## 2016-12-26 DIAGNOSIS — G894 Chronic pain syndrome: Secondary | ICD-10-CM | POA: Diagnosis not present

## 2016-12-26 DIAGNOSIS — M542 Cervicalgia: Secondary | ICD-10-CM | POA: Diagnosis not present

## 2016-12-26 DIAGNOSIS — M5416 Radiculopathy, lumbar region: Secondary | ICD-10-CM | POA: Diagnosis not present

## 2017-01-06 DIAGNOSIS — R21 Rash and other nonspecific skin eruption: Secondary | ICD-10-CM | POA: Diagnosis not present

## 2017-01-23 DIAGNOSIS — G894 Chronic pain syndrome: Secondary | ICD-10-CM | POA: Diagnosis not present

## 2017-01-23 DIAGNOSIS — M542 Cervicalgia: Secondary | ICD-10-CM | POA: Diagnosis not present

## 2017-01-23 DIAGNOSIS — M5416 Radiculopathy, lumbar region: Secondary | ICD-10-CM | POA: Diagnosis not present

## 2017-01-23 DIAGNOSIS — M5481 Occipital neuralgia: Secondary | ICD-10-CM | POA: Diagnosis not present

## 2017-01-23 DIAGNOSIS — M169 Osteoarthritis of hip, unspecified: Secondary | ICD-10-CM | POA: Diagnosis not present

## 2017-02-05 DIAGNOSIS — M542 Cervicalgia: Secondary | ICD-10-CM | POA: Diagnosis not present

## 2017-02-05 DIAGNOSIS — G44229 Chronic tension-type headache, not intractable: Secondary | ICD-10-CM | POA: Diagnosis not present

## 2017-02-12 ENCOUNTER — Other Ambulatory Visit: Payer: Self-pay | Admitting: Internal Medicine

## 2017-02-19 DIAGNOSIS — G894 Chronic pain syndrome: Secondary | ICD-10-CM | POA: Diagnosis not present

## 2017-02-19 DIAGNOSIS — M542 Cervicalgia: Secondary | ICD-10-CM | POA: Diagnosis not present

## 2017-02-19 DIAGNOSIS — M5481 Occipital neuralgia: Secondary | ICD-10-CM | POA: Diagnosis not present

## 2017-03-07 ENCOUNTER — Other Ambulatory Visit: Payer: Self-pay | Admitting: Internal Medicine

## 2017-03-07 NOTE — Telephone Encounter (Signed)
Called pt and set up appointment for 06/12/16

## 2017-03-08 ENCOUNTER — Other Ambulatory Visit: Payer: Self-pay | Admitting: Internal Medicine

## 2017-03-08 DIAGNOSIS — G894 Chronic pain syndrome: Secondary | ICD-10-CM | POA: Diagnosis not present

## 2017-03-08 DIAGNOSIS — M542 Cervicalgia: Secondary | ICD-10-CM | POA: Diagnosis not present

## 2017-03-08 DIAGNOSIS — M5481 Occipital neuralgia: Secondary | ICD-10-CM | POA: Diagnosis not present

## 2017-03-27 DIAGNOSIS — G44229 Chronic tension-type headache, not intractable: Secondary | ICD-10-CM | POA: Diagnosis not present

## 2017-03-27 DIAGNOSIS — G43009 Migraine without aura, not intractable, without status migrainosus: Secondary | ICD-10-CM | POA: Diagnosis not present

## 2017-04-16 DIAGNOSIS — M542 Cervicalgia: Secondary | ICD-10-CM | POA: Diagnosis not present

## 2017-04-16 DIAGNOSIS — G894 Chronic pain syndrome: Secondary | ICD-10-CM | POA: Diagnosis not present

## 2017-04-16 DIAGNOSIS — M5481 Occipital neuralgia: Secondary | ICD-10-CM | POA: Diagnosis not present

## 2017-05-14 DIAGNOSIS — M542 Cervicalgia: Secondary | ICD-10-CM | POA: Diagnosis not present

## 2017-05-14 DIAGNOSIS — G894 Chronic pain syndrome: Secondary | ICD-10-CM | POA: Diagnosis not present

## 2017-05-14 DIAGNOSIS — M5481 Occipital neuralgia: Secondary | ICD-10-CM | POA: Diagnosis not present

## 2017-06-11 DIAGNOSIS — G894 Chronic pain syndrome: Secondary | ICD-10-CM | POA: Diagnosis not present

## 2017-06-11 DIAGNOSIS — M542 Cervicalgia: Secondary | ICD-10-CM | POA: Diagnosis not present

## 2017-06-11 DIAGNOSIS — M5481 Occipital neuralgia: Secondary | ICD-10-CM | POA: Diagnosis not present

## 2017-06-12 ENCOUNTER — Ambulatory Visit: Payer: 59 | Admitting: Internal Medicine

## 2017-06-12 ENCOUNTER — Encounter: Payer: Self-pay | Admitting: Internal Medicine

## 2017-06-12 VITALS — BP 120/68 | HR 114 | Ht 64.0 in | Wt 165.0 lb

## 2017-06-12 DIAGNOSIS — K581 Irritable bowel syndrome with constipation: Secondary | ICD-10-CM

## 2017-06-12 DIAGNOSIS — G43109 Migraine with aura, not intractable, without status migrainosus: Secondary | ICD-10-CM

## 2017-06-12 DIAGNOSIS — E039 Hypothyroidism, unspecified: Secondary | ICD-10-CM | POA: Diagnosis not present

## 2017-06-12 DIAGNOSIS — Z6828 Body mass index (BMI) 28.0-28.9, adult: Secondary | ICD-10-CM | POA: Diagnosis not present

## 2017-06-12 MED ORDER — PHENTERMINE HCL 37.5 MG PO CAPS: 37.5000 mg | ORAL_CAPSULE | ORAL | 0 refills | Status: DC

## 2017-06-12 NOTE — Progress Notes (Signed)
Date:  06/12/2017   Name:  Theresa Murphy   DOB:  08-May-1975   MRN:  [id number]   Chief Complaint: Migraine and Weight Gain (Wants to discuss gaining weight- having trouble trying to get weight. Tried dieting, exercise- cant lift weights because of neck. Tried keto and only lost 2 pounds. )  Migraine   This is a chronic problem. The problem occurs seasonly (less than once a month). The problem has been unchanged. The pain quality is similar to prior headaches. Associated symptoms include back pain. Pertinent negatives include no dizziness or seizures. The treatment provided significant relief.   Weight gain - has gained about 20 lbs over the past 2 years despite trying to exercise. She is also limiting carbs and calories. She has only lost 2 lbs.  She is no longer taking effexor and Dr. Metta Clines suggested a trial of phentermine,  She has never taken any diet medication in the past.   Review of Systems  Constitutional: Positive for unexpected weight change. Negative for chills, diaphoresis and fatigue.  Respiratory: Negative for chest tightness and shortness of breath.   Cardiovascular: Negative for chest pain and palpitations.  Musculoskeletal: Positive for arthralgias and back pain.  Skin: Negative for rash.  Neurological: Positive for headaches. Negative for dizziness, tremors and seizures.    Patient Active Problem List   Diagnosis Date Noted  . Absolute anemia 06/09/2016  . 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  . Chronic tension-type headache, intractable 05/26/2015  . DDD (degenerative disc disease), cervical 02/09/2015  . Cervical fusion syndrome 02/09/2015  . Cervical facet syndrome 02/09/2015  . Bilateral occipital neuralgia 02/09/2015  . DDD (degenerative disc disease), 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/2015  . DDD (degenerative disc disease), lumbar 02/09/2015  . Generalized anxiety disorder 01/08/2015  . 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 Worsening of neck pain following surgery  01/08/2015  . Acquired hypothyroidism 11/15/2014  . Irritable bowel syndrome with constipation 11/15/2014  . OP (osteoporosis) 11/15/2014  . 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 Family planning 11/15/2014    Prior to Admission medications   Medication Sig Start Date End Date Taking? Authorizing Provider  clonazePAM (KLONOPIN) 0.5 MG tablet TAKE 1 TABLET BY MOUTH ONCE DAILY AS NEEDED 07/31/16   Reubin Milan, MD  cyanocobalamin (,VITAMIN B-12,) 1000 MCG/ML injection INJECT INTRAMUSCULARLY ONCE EVERY MONTH 10/24/16   Reubin Milan, MD  cyclobenzaprine (FLEXERIL) 10 MG tablet Limit one half to one tab by mouth per day if tolerated 11/30/15   Ewing Schlein, MD  frovatriptan (FROVA) 2.5 MG tablet TAKE 1 TABLET BY MOUTH AS NEEDED FOR MIGRAINE. IF RECURS, MAY REPEAT IN 2 HOURS. MAX 3 IN 24 HOURS. 04/07/16   Reubin Milan, MD  JUNEL FE 1/20 1-20 MG-MCG tablet TAKE 1 TABLET BY MOUTH EVERY DAY( ALTERNATE FOR NORETHINDRONE/ETH FE) 03/08/17   Reubin Milan, MD  linaclotide [facility]) 145 MCG CAPS capsule Take 1 capsule (145 mcg total) by mouth daily. 08/21/16   Reubin Milan, MD  oxyCODONE-acetaminophen (PERCOCET/ROXICET) 5-325 MG tablet Limit 1 tablet by mouth per day or twice per day if tolerated 04/14/15   [provider]  topiramate (TOPAMAX) 25 MG tablet Take by mouth. 07/11/16   [provider]    11/11/15   [provider]    Allergies  Allergen Reactions  . Codeine Nausea Only  . Cymbalta [Duloxetine Hcl] Nausea Only    Past Surgical History:  Procedure Laterality Date  . CERVICAL FUSION  [address]  Orthoped[facility]  . ECTOPIC PREGNANCY SURGERY     x 2  . SHOULDER ARTHROSCOPY Right 2014    Social History   Tobacco Use  . Smoking status: Former Smoker    Last attempt to quit: 04/10/2004    Years since quitting: 13.1  . Smokeless tobacco: Never Used  Substance Use Topics  . Alcohol use: Yes    Alcohol/week: 1.2 oz    Types: 2 Standard drinks or  equivalent per week    Comment: ocassional  . Drug use: No     Medication list has been reviewed and updated.  PHQ 2/9 Scores 11/30/2015 10/25/2015 09/13/2015 09/02/2015  PHQ - 2 Score 0 0 - 0  Exception Documentation Patient refusal - Patient refusal -    Physical Exam  Constitutional: She is oriented to person, place, and time. She appears well-developed. No distress.  HENT:  Head: Normocephalic and atraumatic.  Cardiovascular: Normal rate, regular rhythm and normal heart sounds.  Pulmonary/Chest: Effort normal and breath sounds normal. No respiratory distress.  Musculoskeletal: Normal range of motion.  Neurological: She is alert and oriented to person, place, and time.  Skin: Skin is warm and dry. No rash noted.  Psychiatric: She has a normal mood and affect. Her behavior is normal. Thought content normal.  Nursing note and vitals reviewed.   BP 120/68   Pulse (!) 114   Ht 5\' 4"  (1.626 m)   Wt 165 lb (74.8 kg)   LMP 06/05/2017 (Exact Date)   SpO2 98%   BMI 28.32 kg/m   Assessment and Plan: 1. BMI 28.0-28.9,adult Return in one month for BP and wt recheck Continue low calorie, low carb diet and exercise - phentermine 37.5 MG capsule; Take 1 capsule (37.5 mg total) by mouth every morning.  Dispense: 30 capsule; Refill: 0  2. Migraine aura without headache Much improved Continue topamax daily and Frova PRN  3. Irritable bowel syndrome with constipation stable - CBC with Differential/Platelet - Comprehensive metabolic panel  4. Acquired hypothyroidism No longer on supplementation - TSH   Meds ordered this encounter  Medications  . phentermine 37.5 MG capsule    Sig: Take 1 capsule (37.5 mg total) by mouth every morning.    Dispense:  30 capsule    Refill:  0    Partially dictated using Animal nutritionistDragon software. Any errors are unintentional.  Bari EdwardLaura Laverta Harnisch, MD [facility]  06/12/2017

## 2017-06-15 DIAGNOSIS — K581 Irritable bowel syndrome with constipation: Secondary | ICD-10-CM | POA: Diagnosis not present

## 2017-06-15 DIAGNOSIS — E039 Hypothyroidism, unspecified: Secondary | ICD-10-CM | POA: Diagnosis not present

## 2017-06-16 LAB — CBC WITH DIFFERENTIAL/PLATELET
BASOS ABS: 0 10*3/uL (ref 0.0–0.2)
BASOS: 0 %
EOS (ABSOLUTE): 0 10*3/uL (ref 0.0–0.4)
Eos: 1 %
Hematocrit: 38.6 % (ref 34.0–46.6)
Hemoglobin: 12.9 g/dL (ref 11.1–15.9)
IMMATURE GRANS (ABS): 0 10*3/uL (ref 0.0–0.1)
IMMATURE GRANULOCYTES: 0 %
LYMPHS: 24 %
Lymphocytes Absolute: 1.5 10*3/uL (ref 0.7–3.1)
MCH: 31.9 pg (ref 26.6–33.0)
MCHC: 33.4 g/dL (ref 31.5–35.7)
MCV: 96 fL (ref 79–97)
Monocytes Absolute: 0.4 10*3/uL (ref 0.1–0.9)
Monocytes: 5 %
NEUTROS PCT: 70 %
Neutrophils Absolute: 4.5 10*3/uL (ref 1.4–7.0)
PLATELETS: 447 10*3/uL — AB (ref 150–379)
RBC: 4.04 x10E6/uL (ref 3.77–5.28)
RDW: 13.3 % (ref 12.3–15.4)
WBC: 6.4 10*3/uL (ref 3.4–10.8)

## 2017-06-16 LAB — COMPREHENSIVE METABOLIC PANEL
ALT: 16 IU/L (ref 0–32)
AST: 18 IU/L (ref 0–40)
Albumin/Globulin Ratio: 1.5 (ref 1.2–2.2)
Albumin: 4.5 g/dL (ref 3.5–5.5)
Alkaline Phosphatase: 78 IU/L (ref 39–117)
BUN/Creatinine Ratio: 11 (ref 9–23)
BUN: 11 mg/dL (ref 6–24)
Bilirubin Total: 0.4 mg/dL (ref 0.0–1.2)
CO2: 16 mmol/L — ABNORMAL LOW (ref 20–29)
Calcium: 9.4 mg/dL (ref 8.7–10.2)
Chloride: 109 mmol/L — ABNORMAL HIGH (ref 96–106)
Creatinine, Ser: 0.96 mg/dL (ref 0.57–1.00)
GFR, EST AFRICAN AMERICAN: 84 mL/min/{1.73_m2} (ref >59)
GFR, EST NON AFRICAN AMERICAN: 73 mL/min/{1.73_m2} (ref >59)
Globulin, Total: 3.1 g/dL (ref 1.5–4.5)
Glucose: 88 mg/dL (ref 65–99)
Potassium: 4.4 mmol/L (ref 3.5–5.2)
Sodium: 142 mmol/L (ref 134–144)
TOTAL PROTEIN: 7.6 g/dL (ref 6.0–8.5)

## 2017-06-16 LAB — TSH: TSH: 1.43 u[IU]/mL (ref 0.450–4.500)

## 2017-07-09 DIAGNOSIS — M5481 Occipital neuralgia: Secondary | ICD-10-CM | POA: Diagnosis not present

## 2017-07-09 DIAGNOSIS — M542 Cervicalgia: Secondary | ICD-10-CM | POA: Diagnosis not present

## 2017-07-09 DIAGNOSIS — G894 Chronic pain syndrome: Secondary | ICD-10-CM | POA: Diagnosis not present

## 2017-07-20 ENCOUNTER — Ambulatory Visit: Payer: 59 | Admitting: Internal Medicine

## 2017-07-20 ENCOUNTER — Encounter: Payer: Self-pay | Admitting: Internal Medicine

## 2017-07-20 VITALS — BP 128/80 | HR 86 | Temp 98.1°F | Ht 64.0 in | Wt 161.0 lb

## 2017-07-20 DIAGNOSIS — J0100 Acute maxillary sinusitis, unspecified: Secondary | ICD-10-CM | POA: Diagnosis not present

## 2017-07-20 DIAGNOSIS — Z6828 Body mass index (BMI) 28.0-28.9, adult: Secondary | ICD-10-CM

## 2017-07-20 DIAGNOSIS — K5903 Drug induced constipation: Secondary | ICD-10-CM

## 2017-07-20 MED ORDER — AMOXICILLIN-POT CLAVULANATE 875-125 MG PO TABS: 1.0000 | ORAL_TABLET | Freq: Two times a day (BID) | ORAL | 0 refills | Status: AC

## 2017-07-20 MED ORDER — PHENTERMINE HCL 37.5 MG PO CAPS: 37.5000 mg | ORAL_CAPSULE | ORAL | 0 refills | Status: DC

## 2017-07-20 NOTE — Progress Notes (Signed)
Date:  07/20/2017   Name:  Theresa CeriseCharlotte Limas   DOB:  [birth year]   MRN:  [id number]   Chief Complaint: Weight Check (Lost 4 pounds in 1 month. Likes medication but causing constipation. What can she take?) and Sinusitis (Been sick since last thursday- right side of face pressure, jaw and ear pain. Sore throat- mainly at night.  ) Sinus Problem  This is a new problem. The current episode started in the past 7 days. The problem is unchanged. There has been no fever. Associated symptoms include ear pain, headaches and sinus pressure. Pertinent negatives include no chills, coughing or shortness of breath.  Constipation  This is a recurrent problem. The problem has been gradually worsening since onset. Her stool frequency is 1 time per week or less. The stool is described as formed. The patient is on a high fiber diet. She exercises regularly. There has been adequate water intake. Pertinent negatives include no abdominal pain, fever or nausea. Risk factors include change in medication usage/dosage. Treatments tried: MOM.   Weight check - last month prescribed phentermine to help with weight loss.   Here to follow up and check BP and discuss treatment. She has lost 4 lbs but she has been constipated.   Review of Systems  Constitutional: Negative for chills, fatigue and fever.  HENT: Positive for ear pain and sinus pressure.   Respiratory: Negative for cough, chest tightness, shortness of breath and wheezing.   Cardiovascular: Negative for chest pain and palpitations.  Gastrointestinal: Positive for constipation. Negative for abdominal pain and nausea.  Neurological: Positive for headaches. Negative for dizziness.    Patient Active Problem List   Diagnosis Date Noted  . Absolute anemia 06/09/2016  . 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  . Chronic tension-type headache, intractable 05/26/2015  . DDD (degenerative disc disease), cervical 02/09/2015  . Cervical fusion syndrome 02/09/2015  . Cervical facet  syndrome 02/09/2015  . Bilateral occipital neuralgia 02/09/2015  . DDD (degenerative disc disease), 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/2015  . DDD (degenerative disc disease), lumbar 02/09/2015  . Generalized anxiety disorder 01/08/2015  . 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 Worsening of neck pain following surgery 01/08/2015  . Acquired hypothyroidism 11/15/2014  . Irritable bowel syndrome with constipation 11/15/2014  . OP (osteoporosis) 11/15/2014  . 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 Family planning 11/15/2014    Prior to Admission medications   Medication Sig Start Date End Date Taking? Authorizing Provider  cyanocobalamin (,VITAMIN B-12,) 1000 MCG/ML injection INJECT 1ML INTRAMUSCULARLY ONCE EVERY MONTH 10/24/16   Reubin MilanBerglund, Nikisha Fleece H, MD  cyclobenzaprine (FLEXERIL) 10 MG tablet Limit one half to one tab by mouth per day if tolerated 11/30/15   Ewing Schleinrisp, Gregory, MD  frovatriptan (FROVA) 2.5 MG tablet TAKE 1 TABLET BY MOUTH AS NEEDED FOR MIGRAINE. IF RECURS, MAY REPEAT IN 2 HOURS. MAX 3 IN 24 HOURS. 04/07/16   Reubin MilanBerglund, Janasia Coverdale H, MD  JUNEL FE 1/20 1-20 MG-MCG tablet TAKE 1 TABLET BY MOUTH EVERY DAY( ALTERNATE FOR NORETHINDRONE/ETH FE) 03/08/17   Reubin MilanBerglund, Ayeza Therriault H, MD  oxyCODONE-acetaminophen (PERCOCET/ROXICET) 5-325 MG tablet Limit 1 tablet by mouth per day or twice per day if tolerated 04/14/15   [provider]  phentermine 37.5 MG capsule Take 1 capsule (37.5 mg total) by mouth every morning. 06/12/17   Reubin MilanBerglund, Anatole Apollo H, MD  topiramate (TOPAMAX) 25 MG tablet Take 75 mg by mouth daily.  07/11/16   [provider]    Allergies  Allergen Reactions  . Codeine Nausea Only  .  Cymbalta [Duloxetine Hcl] Nausea Only    Past Surgical History:  Procedure Laterality Date  . CERVICAL FUSION  [address]  . ECTOPIC PREGNANCY SURGERY     x 2  . SHOULDER ARTHROSCOPY Right 2014    Social History   Tobacco Use  . Smoking status: Former Smoker    Last attempt to quit: 04/10/2004     Years since quitting: 13.2  . Smokeless tobacco: Never Used  Substance Use Topics  . Alcohol use: Yes    Alcohol/week: 1.2 oz    Types: 2 Standard drinks or equivalent per week    Comment: ocassional  . Drug use: No     Medication list has been reviewed and updated.  PHQ 2/9 Scores 06/12/2017 11/30/2015 10/25/2015 09/13/2015  PHQ - 2 Score 0 0 0 -  PHQ- 9 Score 0 - - -  Exception Documentation - Patient refusal - Patient refusal    Physical Exam  Constitutional: She is oriented to person, place, and time. She appears well-developed and well-nourished.  HENT:  Right Ear: External ear and ear canal normal. Tympanic membrane is not erythematous and not retracted.  Left Ear: External ear and ear canal normal. Tympanic membrane is not erythematous and not retracted.  Nose: Right sinus exhibits maxillary sinus tenderness and frontal sinus tenderness. Left sinus exhibits maxillary sinus tenderness and frontal sinus tenderness.  Mouth/Throat: Uvula is midline and mucous membranes are normal. No oral lesions. Posterior oropharyngeal erythema present. No oropharyngeal exudate.  Cardiovascular: Normal rate, regular rhythm and normal heart sounds.  Pulmonary/Chest: Breath sounds normal. She has no wheezes. She has no rales.  Lymphadenopathy:    She has no cervical adenopathy.  Neurological: She is alert and oriented to person, place, and time.  Psychiatric: She has a normal mood and affect. Her behavior is normal. Judgment and thought content normal.    BP 128/80   Pulse 86   Temp 98.1 F (36.7 C) (Oral)   Ht 5\' 4"  (1.626 m)   Wt 161 lb (73 kg)   SpO2 97%   BMI 27.64 kg/m   Assessment and Plan: 1. Acute non-recurrent maxillary sinusitis Continue otc cold medications/decongestants - amoxicillin-clavulanate (AUGMENTIN) 875-125 MG tablet; Take 1 tablet by mouth 2 (two) times daily for 10 days.  Dispense: 20 tablet; Refill: 0  2. Drug-induced constipation Samples of Linzess 290 mg  given  3. BMI 28.0-28.9,adult Continue phentermine and recheck in one month - phentermine 37.5 MG capsule; Take 1 capsule (37.5 mg total) by mouth every morning.  Dispense: 30 capsule; Refill: 0   Meds ordered this encounter  Medications  . amoxicillin-clavulanate (AUGMENTIN) 875-125 MG tablet    Sig: Take 1 tablet by mouth 2 (two) times daily for 10 days.    Dispense:  20 tablet    Refill:  0  . phentermine 37.5 MG capsule    Sig: Take 1 capsule (37.5 mg total) by mouth every morning.    Dispense:  30 capsule    Refill:  0    Partially dictated using Animal nutritionist. Any errors are unintentional.  Bari Edward, MD [facility]  07/20/2017

## 2017-07-29 ENCOUNTER — Other Ambulatory Visit: Payer: Self-pay | Admitting: Internal Medicine

## 2017-08-06 DIAGNOSIS — G894 Chronic pain syndrome: Secondary | ICD-10-CM | POA: Diagnosis not present

## 2017-08-06 DIAGNOSIS — M542 Cervicalgia: Secondary | ICD-10-CM | POA: Diagnosis not present

## 2017-08-06 DIAGNOSIS — M5481 Occipital neuralgia: Secondary | ICD-10-CM | POA: Diagnosis not present

## 2017-08-14 ENCOUNTER — Encounter: Payer: Self-pay | Admitting: Internal Medicine

## 2017-08-14 ENCOUNTER — Other Ambulatory Visit: Payer: Self-pay | Admitting: Internal Medicine

## 2017-08-14 DIAGNOSIS — R87610 Atypical squamous cells of undetermined significance on cytologic smear of cervix (ASC-US): Secondary | ICD-10-CM | POA: Insufficient documentation

## 2017-08-29 ENCOUNTER — Encounter: Payer: Self-pay | Admitting: Internal Medicine

## 2017-08-29 ENCOUNTER — Ambulatory Visit: Payer: 59 | Admitting: Internal Medicine

## 2017-08-29 VITALS — BP 116/79 | HR 108 | Resp 16 | Ht 64.0 in | Wt 159.6 lb

## 2017-08-29 DIAGNOSIS — F331 Major depressive disorder, recurrent, moderate: Secondary | ICD-10-CM

## 2017-08-29 DIAGNOSIS — Z6828 Body mass index (BMI) 28.0-28.9, adult: Secondary | ICD-10-CM | POA: Diagnosis not present

## 2017-08-29 DIAGNOSIS — F329 Major depressive disorder, single episode, unspecified: Secondary | ICD-10-CM | POA: Insufficient documentation

## 2017-08-29 DIAGNOSIS — F324 Major depressive disorder, single episode, in partial remission: Secondary | ICD-10-CM

## 2017-08-29 DIAGNOSIS — K581 Irritable bowel syndrome with constipation: Secondary | ICD-10-CM | POA: Diagnosis not present

## 2017-08-29 DIAGNOSIS — Z981 Arthrodesis status: Secondary | ICD-10-CM | POA: Insufficient documentation

## 2017-08-29 HISTORY — DX: Major depressive disorder, single episode, in partial remission: F32.4

## 2017-08-29 MED ORDER — SERTRALINE HCL 50 MG PO TABS: 50.0000 mg | ORAL_TABLET | Freq: Every day | ORAL | 1 refills | Status: DC

## 2017-08-29 NOTE — Progress Notes (Signed)
Date:  08/29/2017   Name:  Theresa Murphy   DOB:  Apr 24, 1975   MRN:  [id number]   Chief Complaint: Constipation (Linzess working well ) Constipation  This is a chronic problem. The problem has been gradually improving since onset. Her stool frequency is 1 time per day. Pertinent negatives include no fever.  Depression         This is a chronic problem.  The problem has been gradually worsening since onset.  Associated symptoms include decreased concentration, irritable, decreased interest, headaches (unchanged) and sad.  Associated symptoms include no fatigue and no suicidal ideas.     The symptoms are aggravated by work stress and family issues.  Past treatments include SSRIs - Selective serotonin reuptake inhibitors.  Weight - did not lose much weight on phentermine and has become more depressed as well.  We will stop it.   Review of Systems  Constitutional: Negative for chills, fatigue and fever.  Respiratory: Negative for chest tightness and shortness of breath.   Cardiovascular: Negative for chest pain and palpitations.  Gastrointestinal: Positive for constipation.  Neurological: Positive for headaches (unchanged). Negative for dizziness.  Hematological: Negative for adenopathy.  Psychiatric/Behavioral: Positive for decreased concentration, depression, dysphoric mood and sleep disturbance. Negative for self-injury and suicidal ideas.    Patient Active Problem List   Diagnosis Date Noted  . History of arthrodesis 08/29/2017  . Pap smear abnormality of cervix with ASCUS favoring benign 08/14/2017  . Drug-induced constipation 07/20/2017  . Neck pain 02/05/2017  . Absolute anemia 06/09/2016  . 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  . Chronic tension-type headache, intractable 05/26/2015  . DDD (degenerative disc disease), cervical 02/09/2015  . Cervical fusion syndrome 02/09/2015  . Cervical facet syndrome 02/09/2015  . Bilateral occipital neuralgia 02/09/2015  . DDD (degenerative  disc disease), 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/2015  . DDD (degenerative disc disease), lumbar 02/09/2015  . Generalized anxiety disorder 01/08/2015  . 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 Worsening of neck pain following surgery 01/08/2015  . Acquired hypothyroidism 11/15/2014  . Irritable bowel syndrome with constipation 11/15/2014  . OP (osteoporosis) 11/15/2014  . 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 Family planning 11/15/2014    Prior to Admission medications   Medication Sig Start Date End Date Taking? Authorizing Provider  cyanocobalamin (,VITAMIN B-12,) 1000 MCG/ML injection Inject 1 mL (1,000 mcg total) into the muscle every 30 (thirty) days. 07/29/17  Yes Reubin Milan, MD  cyclobenzaprine (FLEXERIL) 10 MG tablet Limit one half to one tab by mouth per day if tolerated 11/30/15  Yes Ewing Schlein, MD  frovatriptan (FROVA) 2.5 MG tablet TAKE 1 TABLET BY MOUTH AS NEEDED FOR MIGRAINE. IF RECURS, MAY REPEAT IN 2 HOURS. MAX 3 IN 24 HOURS. 04/07/16  Yes Reubin Milan, MD  JUNEL FE 1/20 1-20 MG-MCG tablet TAKE 1 TABLET BY MOUTH EVERY DAY( ALTERNATE FOR NORETHINDRONE/ETH FE) 08/14/17  Yes Reubin Milan, MD  linaclotide [facility]) 290 MCG CAPS capsule Take 290 mcg by mouth daily before breakfast.   Yes [provider]  oxyCODONE-acetaminophen (PERCOCET/ROXICET) 5-325 MG tablet Limit 1 tablet by mouth per day or twice per day if tolerated 04/14/15  Yes [provider]  phentermine 37.5 MG capsule Take 1 capsule (37.5 mg total) by mouth every morning. 07/20/17  Yes Reubin Milan, MD  topiramate (TOPAMAX) 25 MG tablet Take 75 mg by mouth daily.  07/11/16  Yes [provider]    Allergies  Allergen Reactions  . Codeine Nausea Only  .  Cymbalta [Duloxetine Hcl] Nausea Only    Past Surgical History:  Procedure Laterality Date  . CERVICAL FUSION  [address]  . ECTOPIC PREGNANCY SURGERY     x 2  . SHOULDER ARTHROSCOPY Right 2014    Social History    Tobacco Use  . Smoking status: Former Smoker    Last attempt to quit: 04/10/2004    Years since quitting: 13.3  . Smokeless tobacco: Never Used  Substance Use Topics  . Alcohol use: Yes    Alcohol/week: 1.2 oz    Types: 2 Standard drinks or equivalent per week    Comment: ocassional  . Drug use: No     Medication list has been reviewed and updated.  PHQ 2/9 Scores 08/29/2017 06/12/2017 11/30/2015 10/25/2015  PHQ - 2 Score 4 0 0 0  PHQ- 9 Score 18 0 - -  Exception Documentation - - Patient refusal -    Physical Exam  Constitutional: She is oriented to person, place, and time. She appears well-developed. She is irritable. No distress.  HENT:  Head: Normocephalic and atraumatic.  Cardiovascular: Normal rate, regular rhythm and normal heart sounds.  Pulmonary/Chest: Effort normal and breath sounds normal. No respiratory distress.  Neurological: She is alert and oriented to person, place, and time.  Skin: Skin is warm and dry. No rash noted.  Psychiatric: Her behavior is normal. Judgment and thought content normal. Cognition and memory are normal. She exhibits a depressed mood (and tearful).  Nursing note and vitals reviewed.   BP 116/79   Pulse (!) 108   Resp 16   Ht  (1.626 m)   Wt 159 lb 9.6 oz (72.4 kg)   SpO2 98%   BMI 27.40 kg/m   Assessment and Plan: 1. BMI 28.0-28.9,adult Remain off of phentermine  2. Irritable bowel syndrome with constipation Doing much better on Linzess  3. Moderate episode of recurrent major depressive disorder (HCC) Begin sertraline, follow up one month Consider sleep aid at that time - sertraline (ZOLOFT) 50 MG tablet; Take 1 tablet (50 mg total) by mouth daily.  Dispense: 30 tablet; Refill: 1   Meds ordered this encounter  Medications  . sertraline (ZOLOFT) 50 MG tablet    Sig: Take 1 tablet (50 mg total) by mouth daily.    Dispense:  30 tablet    Refill:  1    Partially dictated using Animal nutritionist. Any errors are  unintentional.  Bari Edward, MD [facility]  08/29/2017

## 2017-09-04 DIAGNOSIS — M5481 Occipital neuralgia: Secondary | ICD-10-CM | POA: Diagnosis not present

## 2017-09-04 DIAGNOSIS — M542 Cervicalgia: Secondary | ICD-10-CM | POA: Diagnosis not present

## 2017-09-04 DIAGNOSIS — G894 Chronic pain syndrome: Secondary | ICD-10-CM | POA: Diagnosis not present

## 2017-09-25 DIAGNOSIS — G44229 Chronic tension-type headache, not intractable: Secondary | ICD-10-CM | POA: Diagnosis not present

## 2017-09-25 DIAGNOSIS — M542 Cervicalgia: Secondary | ICD-10-CM | POA: Diagnosis not present

## 2017-10-01 DIAGNOSIS — G894 Chronic pain syndrome: Secondary | ICD-10-CM | POA: Diagnosis not present

## 2017-10-01 DIAGNOSIS — M5481 Occipital neuralgia: Secondary | ICD-10-CM | POA: Diagnosis not present

## 2017-10-01 DIAGNOSIS — M542 Cervicalgia: Secondary | ICD-10-CM | POA: Diagnosis not present

## 2017-10-10 ENCOUNTER — Encounter: Payer: Self-pay | Admitting: Internal Medicine

## 2017-10-10 ENCOUNTER — Ambulatory Visit: Payer: 59 | Admitting: Internal Medicine

## 2017-10-10 VITALS — BP 110/62 | HR 98 | Resp 16 | Ht 64.0 in | Wt 161.0 lb

## 2017-10-10 DIAGNOSIS — F331 Major depressive disorder, recurrent, moderate: Secondary | ICD-10-CM | POA: Diagnosis not present

## 2017-10-10 DIAGNOSIS — K5903 Drug induced constipation: Secondary | ICD-10-CM | POA: Diagnosis not present

## 2017-10-10 MED ORDER — NALOXEGOL OXALATE 25 MG PO TABS: 25.0000 mg | ORAL_TABLET | Freq: Every day | ORAL | 5 refills | Status: DC

## 2017-10-10 MED ORDER — SERTRALINE HCL 50 MG PO TABS: 50.0000 mg | ORAL_TABLET | Freq: Every day | ORAL | 5 refills | Status: DC

## 2017-10-10 NOTE — Progress Notes (Signed)
Date:  10/10/2017   Name:  Theresa Murphy   DOB:  05/08/1975   MRN:  [id number]   Chief Complaint: Depression Depression         This is a chronic problem.  The problem has been rapidly improving since onset.  Associated symptoms include no headaches and no suicidal ideas.  Past treatments include SSRIs - Selective serotonin reuptake inhibitors.  Compliance with treatment is good.  Previous treatment provided significant relief. Constipation  This is a chronic problem. The problem has been gradually worsening since onset. Her stool frequency is 1 time per week or less. The stool is described as pellet like. The patient is on a high fiber diet. There has been adequate water intake. Risk factors include change in medication usage/dosage (daily percocet use). Treatments tried: linzess. The treatment provided mild relief.      Review of Systems  Constitutional: Negative for chills, diaphoresis and unexpected weight change.  Respiratory: Negative for choking and shortness of breath.   Cardiovascular: Negative for chest pain and palpitations.  Gastrointestinal: Positive for constipation.  Neurological: Negative for dizziness and headaches.  Psychiatric/Behavioral: Positive for depression and sleep disturbance. Negative for confusion, hallucinations and suicidal ideas. The patient is not nervous/anxious.     Patient Active Problem List   Diagnosis Date Noted  . History of arthrodesis 08/29/2017  . Depression 08/29/2017  . Pap smear abnormality of cervix with ASCUS favoring benign 08/14/2017  . Drug-induced constipation 07/20/2017  . Neck pain 02/05/2017  . Absolute anemia 06/09/2016  . 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  . Chronic tension-type headache, intractable 05/26/2015  . DDD (degenerative disc disease), cervical 02/09/2015  . Cervical fusion syndrome 02/09/2015  . Cervical facet syndrome 02/09/2015  . Bilateral occipital neuralgia 02/09/2015  . DDD (degenerative disc disease),  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/2015  . DDD (degenerative disc disease), lumbar 02/09/2015  . Generalized anxiety disorder 01/08/2015  . 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 Worsening of neck pain following surgery 01/08/2015  . Acquired hypothyroidism 11/15/2014  . Irritable bowel syndrome with constipation 11/15/2014  . OP (osteoporosis) 11/15/2014  . 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 Family planning 11/15/2014    Prior to Admission medications   Medication Sig Start Date End Date Taking? Authorizing Provider  cyanocobalamin (,VITAMIN B-12,) 1000 MCG/ML injection Inject 1 mL (1,000 mcg total) into the muscle every 30 (thirty) days. 07/29/17  Yes Reubin Milan, MD  cyclobenzaprine (FLEXERIL) 10 MG tablet Limit one half to one tab by mouth per day if tolerated 11/30/15  Yes Ewing Schlein, MD  frovatriptan (FROVA) 2.5 MG tablet TAKE 1 TABLET BY MOUTH AS NEEDED FOR MIGRAINE. IF RECURS, MAY REPEAT IN 2 HOURS. MAX 3 IN 24 HOURS. 04/07/16  Yes Reubin Milan, MD  JUNEL FE 1/20 1-20 MG-MCG tablet TAKE 1 TABLET BY MOUTH EVERY DAY( ALTERNATE FOR NORETHINDRONE/ETH FE) 08/14/17  Yes Reubin Milan, MD  linaclotide [facility]) 290 MCG CAPS capsule Take 290 mcg by mouth daily before breakfast.   Yes [provider]  oxyCODONE-acetaminophen (PERCOCET/ROXICET) 5-325 MG tablet Limit 1 tablet by mouth per day or twice per day if tolerated 04/14/15  Yes [provider]  sertraline (ZOLOFT) 50 MG tablet Take 1 tablet (50 mg total) by mouth daily. 08/29/17  Yes Reubin Milan, MD  topiramate (TOPAMAX) 25 MG tablet Take 75 mg by mouth daily.  07/11/16  Yes [provider]    Allergies  Allergen Reactions  . Codeine Nausea Only  . Cymbalta [Duloxetine  Hcl] Nausea Only    Past Surgical History:  Procedure Laterality Date  . CERVICAL FUSION  [address]  . ECTOPIC PREGNANCY SURGERY     x 2  . SHOULDER ARTHROSCOPY Right 2014    Social History   Tobacco Use  . Smoking  status: Former Smoker    Last attempt to quit: 04/10/2004    Years since quitting: 13.5  . Smokeless tobacco: Never Used  Substance Use Topics  . Alcohol use: Yes    Alcohol/week: 1.2 oz    Types: 2 Standard drinks or equivalent per week    Comment: ocassional  . Drug use: No     Medication list has been reviewed and updated.  Current Meds  Medication Sig  . cyanocobalamin (,VITAMIN B-12,) 1000 MCG/ML injection Inject 1 mL (1,000 mcg total) into the muscle every 30 (thirty) days.  . cyclobenzaprine (FLEXERIL) 10 MG tablet Limit one half to one tab by mouth per day if tolerated  . frovatriptan (FROVA) 2.5 MG tablet TAKE 1 TABLET BY MOUTH AS NEEDED FOR MIGRAINE. IF RECURS, MAY REPEAT IN 2 HOURS. MAX 3 IN 24 HOURS.  Marland Kitchen JUNEL FE 1/20 1-20 MG-MCG tablet TAKE 1 TABLET BY MOUTH EVERY DAY( ALTERNATE FOR NORETHINDRONE/ETH FE)  . linaclotide (LINZESS) 290 MCG CAPS capsule Take 290 mcg by mouth daily before breakfast.  . oxyCODONE-acetaminophen (PERCOCET/ROXICET) 5-325 MG tablet Limit 1 tablet by mouth per day or twice per day if tolerated  . sertraline (ZOLOFT) 50 MG tablet Take 1 tablet (50 mg total) by mouth daily.  Marland Kitchen topiramate (TOPAMAX) 25 MG tablet Take 75 mg by mouth daily.     PHQ 2/9 Scores 10/10/2017 08/29/2017 06/12/2017 11/30/2015  PHQ - 2 Score 0 4 0 0  PHQ- 9 Score 6 18 0 -  Exception Documentation - - - Patient refusal    Physical Exam  Constitutional: She is oriented to person, place, and time. She appears well-developed. No distress.  HENT:  Head: Normocephalic and atraumatic.  Cardiovascular: Normal rate, regular rhythm and normal heart sounds.  Pulmonary/Chest: Effort normal and breath sounds normal. No respiratory distress.  Abdominal: Bowel sounds are normal. There is no tenderness.  Musculoskeletal: Normal range of motion.  Neurological: She is alert and oriented to person, place, and time.  Skin: Skin is warm and dry. No rash noted.  Psychiatric: She has a normal mood  and affect. Her speech is normal and behavior is normal. Judgment and thought content normal. Cognition and memory are normal. She expresses no suicidal ideation. She expresses no suicidal plans.  Nursing note and vitals reviewed.   BP 110/62   Pulse 98   Resp 16   Ht 5\' 4"  (1.626 m)   Wt 161 lb (73 kg)   LMP 09/26/2017 (Approximate)   SpO2 98%   BMI 27.64 kg/m   Assessment and Plan: 1. Moderate episode of recurrent major depressive disorder (HCC) Much improved - continue current medications Follow up in 4-5 months - sertraline (ZOLOFT) 50 MG tablet; Take 1 tablet (50 mg total) by mouth daily.  Dispense: 30 tablet; Refill: 5  2. Drug-induced constipation Will try movantik in place of Linzess - naloxegol oxalate (MOVANTIK) 25 MG TABS tablet; Take 1 tablet (25 mg total) by mouth daily.  Dispense: 30 tablet; Refill: 5   Meds ordered this encounter  Medications  . sertraline (ZOLOFT) 50 MG tablet    Sig: Take 1 tablet (50 mg total) by mouth daily.    Dispense:  30 tablet    Refill:  5  . naloxegol oxalate (MOVANTIK) 25 MG TABS tablet    Sig: Take 1 tablet (25 mg total) by mouth daily.    Dispense:  30 tablet    Refill:  5    Partially dictated using Animal nutritionistDragon software. Any errors are unintentional.  Bari EdwardLaura Shant Hence, MD [facility]  10/10/2017   There are no diagnoses linked to this encounter.

## 2017-10-16 DIAGNOSIS — M542 Cervicalgia: Secondary | ICD-10-CM | POA: Diagnosis not present

## 2017-10-16 DIAGNOSIS — G894 Chronic pain syndrome: Secondary | ICD-10-CM | POA: Diagnosis not present

## 2017-10-16 DIAGNOSIS — M5481 Occipital neuralgia: Secondary | ICD-10-CM | POA: Diagnosis not present

## 2017-10-29 DIAGNOSIS — G894 Chronic pain syndrome: Secondary | ICD-10-CM | POA: Diagnosis not present

## 2017-10-29 DIAGNOSIS — M5481 Occipital neuralgia: Secondary | ICD-10-CM | POA: Diagnosis not present

## 2017-10-29 DIAGNOSIS — M542 Cervicalgia: Secondary | ICD-10-CM | POA: Diagnosis not present

## 2017-10-31 ENCOUNTER — Other Ambulatory Visit: Payer: Self-pay | Admitting: Internal Medicine

## 2017-11-26 DIAGNOSIS — M542 Cervicalgia: Secondary | ICD-10-CM | POA: Diagnosis not present

## 2017-11-26 DIAGNOSIS — M5481 Occipital neuralgia: Secondary | ICD-10-CM | POA: Diagnosis not present

## 2017-11-26 DIAGNOSIS — G894 Chronic pain syndrome: Secondary | ICD-10-CM | POA: Diagnosis not present

## 2017-12-24 DIAGNOSIS — M5481 Occipital neuralgia: Secondary | ICD-10-CM | POA: Diagnosis not present

## 2017-12-24 DIAGNOSIS — M542 Cervicalgia: Secondary | ICD-10-CM | POA: Diagnosis not present

## 2017-12-24 DIAGNOSIS — G894 Chronic pain syndrome: Secondary | ICD-10-CM | POA: Diagnosis not present

## 2018-01-21 DIAGNOSIS — M5481 Occipital neuralgia: Secondary | ICD-10-CM | POA: Diagnosis not present

## 2018-01-21 DIAGNOSIS — G894 Chronic pain syndrome: Secondary | ICD-10-CM | POA: Diagnosis not present

## 2018-01-21 DIAGNOSIS — M542 Cervicalgia: Secondary | ICD-10-CM | POA: Diagnosis not present

## 2018-02-08 DIAGNOSIS — M5481 Occipital neuralgia: Secondary | ICD-10-CM | POA: Diagnosis not present

## 2018-02-08 DIAGNOSIS — G894 Chronic pain syndrome: Secondary | ICD-10-CM | POA: Diagnosis not present

## 2018-02-08 DIAGNOSIS — M542 Cervicalgia: Secondary | ICD-10-CM | POA: Diagnosis not present

## 2018-03-13 IMAGING — MG MM DIGITAL SCREENING BILAT W/ TOMO W/ CAD
9 of 12 series · 9 of 28 positions shown · non-contrast
Comparison: None.

CLINICAL DATA: Screening.

EXAM:
2D DIGITAL SCREENING BILATERAL MAMMOGRAM WITH CAD AND ADJUNCT TOMO

[L CC]
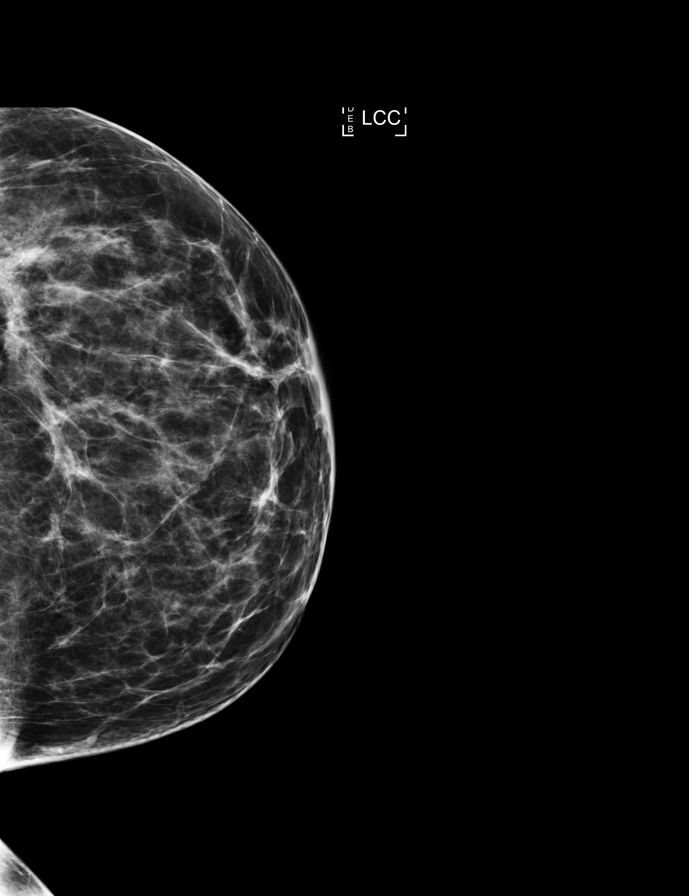

[R MLO synth-2D]
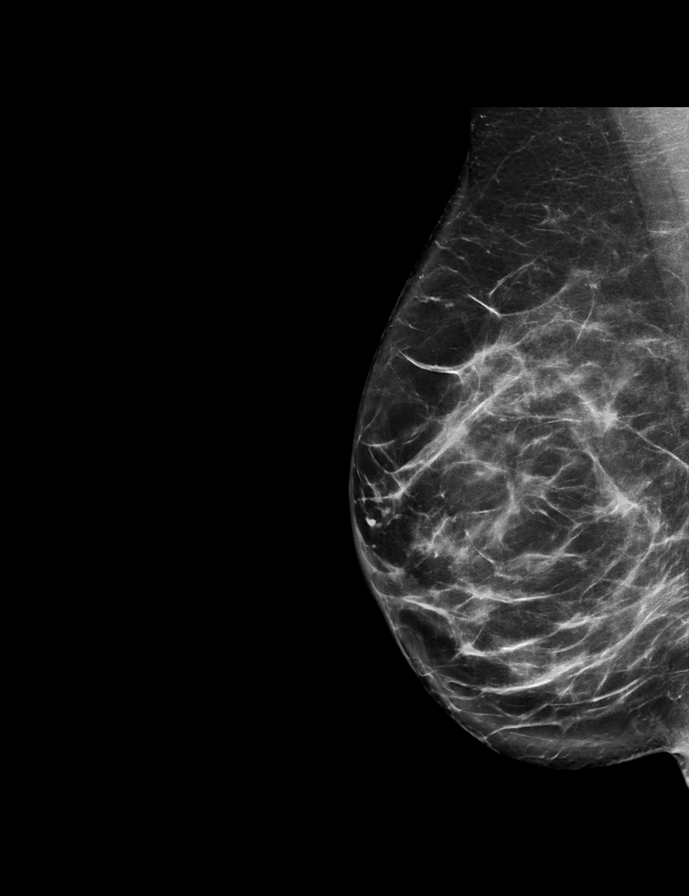

[R CC synth-2D]
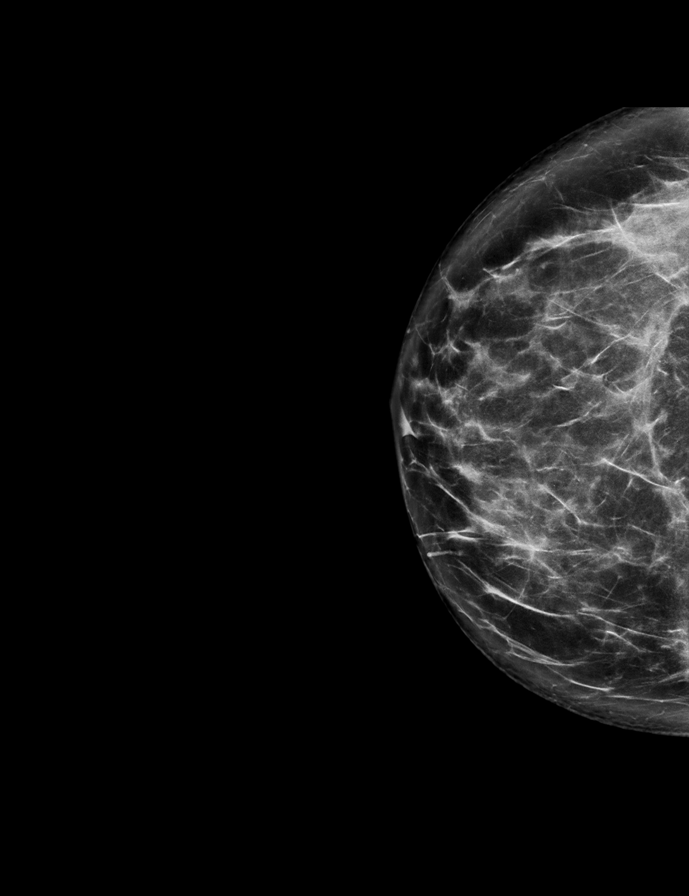

[L MLO synth-2D]
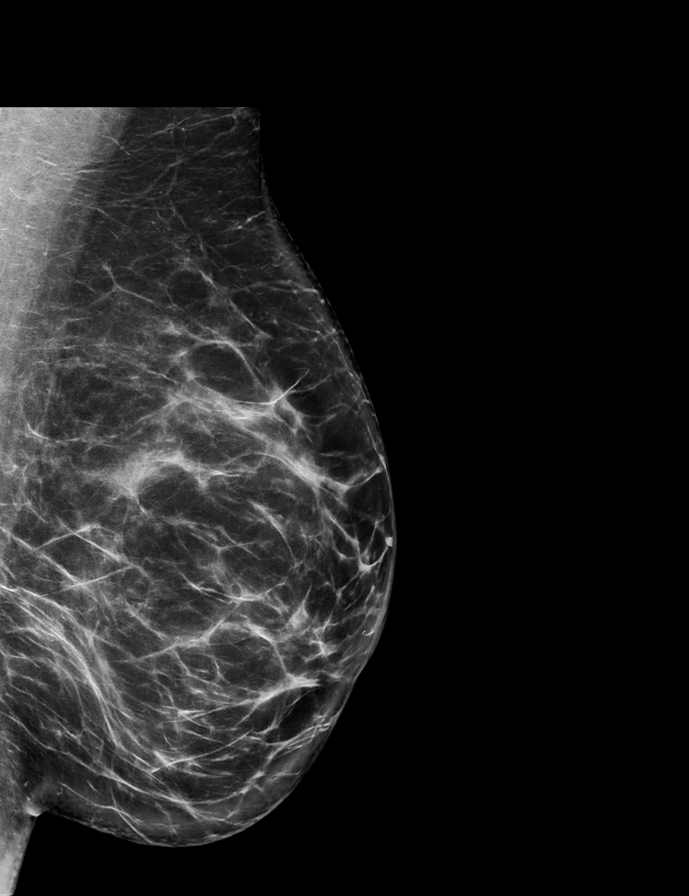

[R CC]
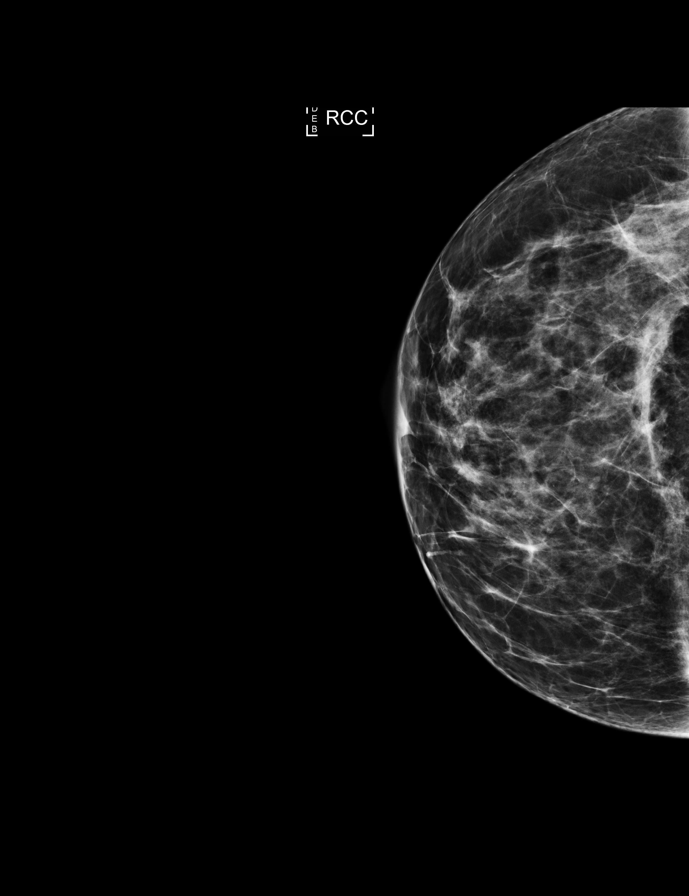

[L CC synth-2D]
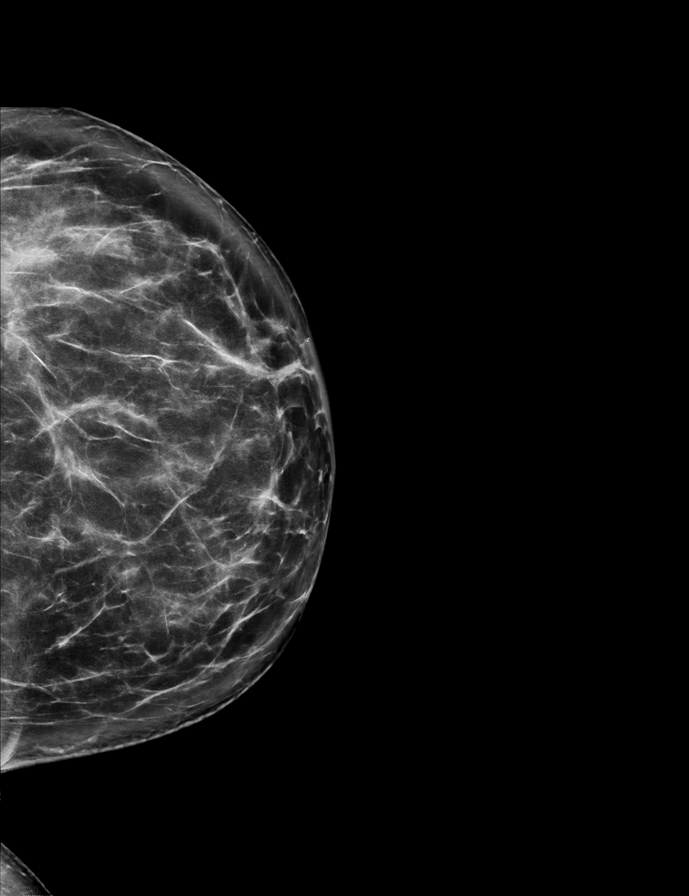

[L MLO]
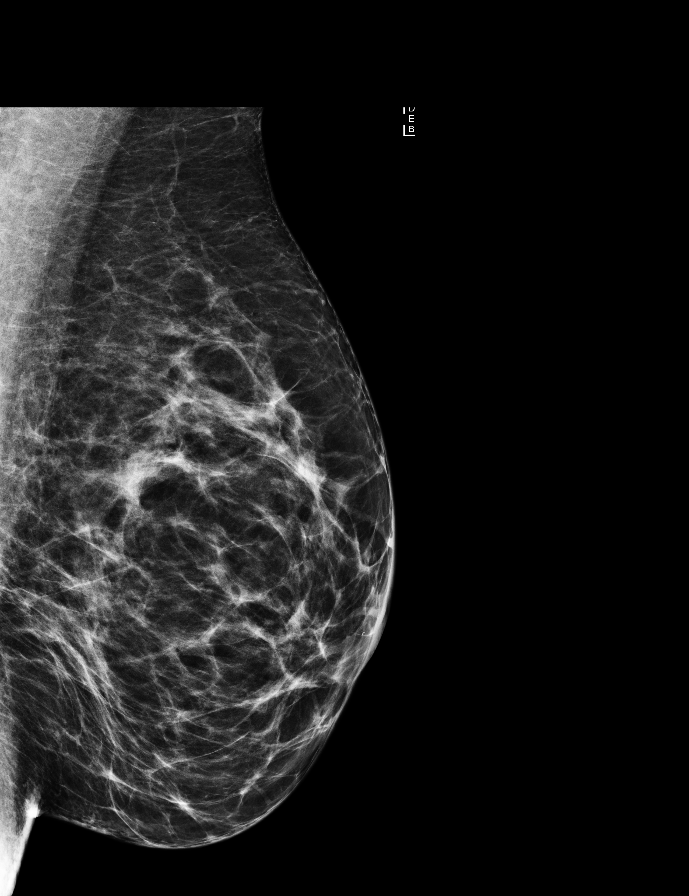

[R MLO]
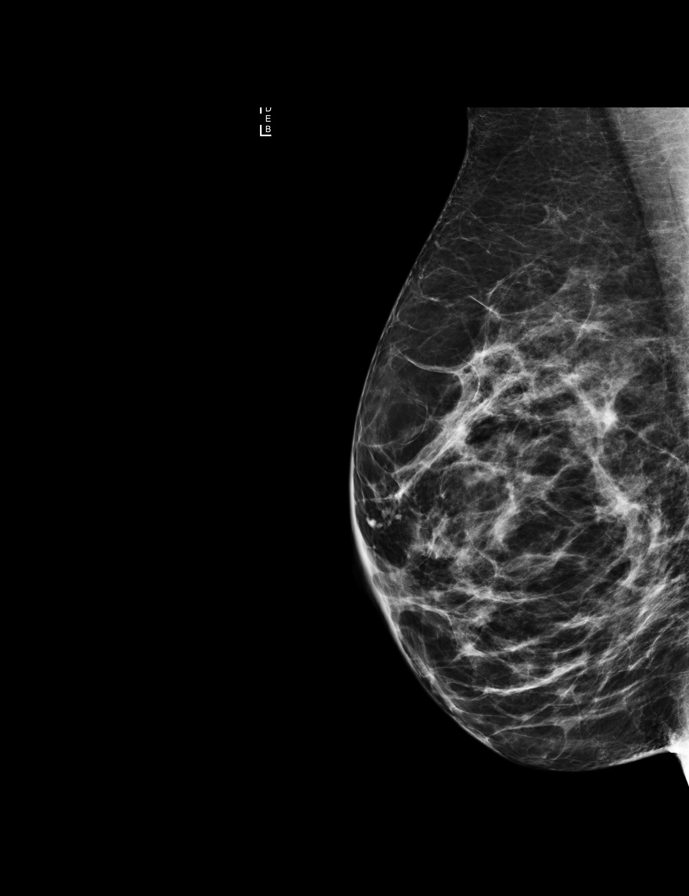

[R CC tomo · tomo slice 40/79.0]
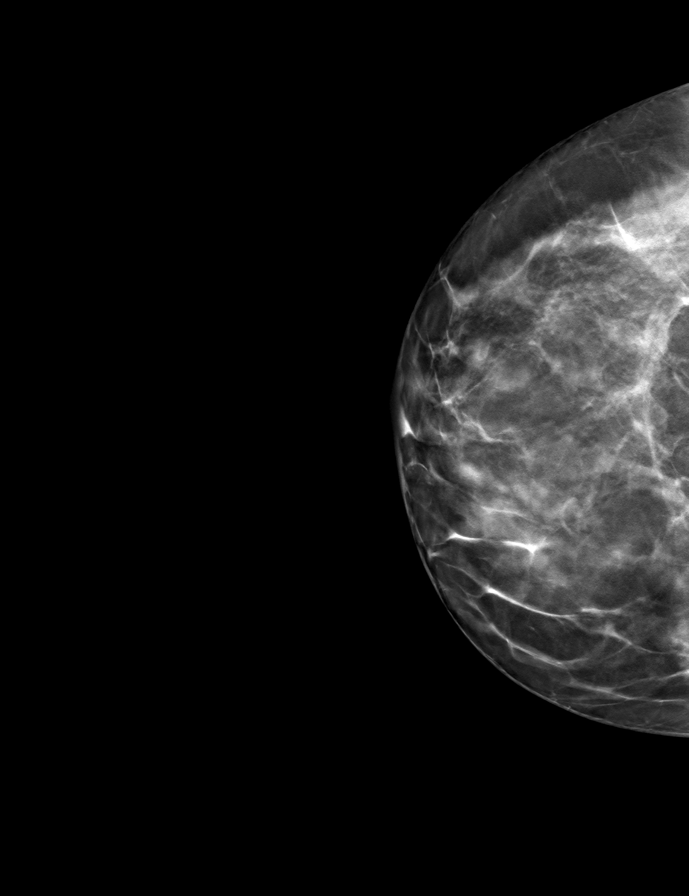

[9 of 28 positions shown; findings below may reference images not displayed]

ACR Breast Density Category b: There are scattered areas of
fibroglandular density.
FINDINGS: There are no findings suspicious for malignancy. Images were
processed with CAD.
IMPRESSION: No mammographic evidence of malignancy. A result letter of this
screening mammogram will be mailed directly to the patient.

RECOMMENDATION:
Screening mammogram in one year. (Code:[id number]-M-3AZ)

BI-RADS CATEGORY  1: Negative.

## 2018-03-18 DIAGNOSIS — M5481 Occipital neuralgia: Secondary | ICD-10-CM | POA: Diagnosis not present

## 2018-03-18 DIAGNOSIS — M542 Cervicalgia: Secondary | ICD-10-CM | POA: Diagnosis not present

## 2018-03-18 DIAGNOSIS — G894 Chronic pain syndrome: Secondary | ICD-10-CM | POA: Diagnosis not present

## 2018-03-21 ENCOUNTER — Other Ambulatory Visit (HOSPITAL_COMMUNITY)
Admission: RE | Admit: 2018-03-21 | Discharge: 2018-03-21 | Disposition: A | Discharge status: Home / Self Care | Payer: 59 | Source: Ambulatory Visit | Attending: Internal Medicine | Admitting: Internal Medicine

## 2018-03-21 ENCOUNTER — Ambulatory Visit (INDEPENDENT_AMBULATORY_CARE_PROVIDER_SITE_OTHER): Payer: 59 | Admitting: Internal Medicine

## 2018-03-21 ENCOUNTER — Encounter: Payer: Self-pay | Admitting: Internal Medicine

## 2018-03-21 VITALS — BP 102/78 | HR 99 | Ht 64.0 in | Wt 164.0 lb

## 2018-03-21 DIAGNOSIS — K581 Irritable bowel syndrome with constipation: Secondary | ICD-10-CM

## 2018-03-21 DIAGNOSIS — Z1231 Encounter for screening mammogram for malignant neoplasm of breast: Secondary | ICD-10-CM | POA: Diagnosis not present

## 2018-03-21 DIAGNOSIS — R87610 Atypical squamous cells of undetermined significance on cytologic smear of cervix (ASC-US): Secondary | ICD-10-CM | POA: Diagnosis not present

## 2018-03-21 DIAGNOSIS — Z0000 Encounter for general adult medical examination without abnormal findings: Secondary | ICD-10-CM | POA: Diagnosis not present

## 2018-03-21 DIAGNOSIS — M79671 Pain in right foot: Secondary | ICD-10-CM

## 2018-03-21 DIAGNOSIS — R7989 Other specified abnormal findings of blood chemistry: Secondary | ICD-10-CM | POA: Diagnosis not present

## 2018-03-21 DIAGNOSIS — F331 Major depressive disorder, recurrent, moderate: Secondary | ICD-10-CM

## 2018-03-21 DIAGNOSIS — G43109 Migraine with aura, not intractable, without status migrainosus: Secondary | ICD-10-CM

## 2018-03-21 DIAGNOSIS — D518 Other vitamin B12 deficiency anemias: Secondary | ICD-10-CM

## 2018-03-21 LAB — POCT URINALYSIS DIPSTICK
Bilirubin, UA: NEGATIVE
Glucose, UA: NEGATIVE
Ketones, UA: NEGATIVE
Leukocytes, UA: NEGATIVE
Nitrite, UA: NEGATIVE
Protein, UA: NEGATIVE
Spec Grav, UA: 1.02 (ref 1.010–1.025)
Urobilinogen, UA: 0.2 E.U./dL
pH, UA: 6 (ref 5.0–8.0)

## 2018-03-21 MED ORDER — LINACLOTIDE 290 MCG PO CAPS: 290.0000 ug | ORAL_CAPSULE | Freq: Every day | ORAL | 5 refills | Status: DC

## 2018-03-21 MED ORDER — CYANOCOBALAMIN 1000 MCG/ML IJ SOLN: 1000.0000 ug | INTRAMUSCULAR | 12 refills | Status: DC

## 2018-03-21 NOTE — Patient Instructions (Signed)
Increase zoloft to 100 mg per day.  Follow up with me in 6 weeks. Call for refill of new dose when needed.

## 2018-03-21 NOTE — Progress Notes (Signed)
Date:  03/21/2018   Name:  Theresa Murphy   DOB:  1975-05-19   MRN:  [id number]   Chief Complaint: Annual Exam (Breast and pap exam. ); Depression (Wants to know if she can increase Zofolt. PHQ9- 12); and Opoid Induced Constipation (Movantic not covered by insurance. Would like to go back on Linzess. ) Theresa Murphy is a 42 y.o. female who presents today for her Complete Annual Exam. She feels fairly well. She reports exercising none. She reports she is sleeping fairly well. She denies breast problems.  She is overdue for a mammogram.  She is also due for a pap smear. She continues on OCPs with very light menses.  No hot flashes or sweats.  Migraine   This is a recurrent problem. The problem has been resolved. Pertinent negatives include no abdominal pain, coughing, dizziness, fever, hearing loss, tinnitus or vomiting.  Constipation  Pertinent negatives include no abdominal pain, diarrhea, fever or vomiting.  Depression         This is a new problem.  The problem has been gradually worsening since onset.  Associated symptoms include no fatigue and no headaches.  Past treatments include SSRIs - Selective serotonin reuptake inhibitors. Foot Injury   The incident occurred more than 1 week ago. There was no injury mechanism. Pain location: right great toe. The pain is moderate.    Review of Systems  Constitutional: Negative for chills, fatigue and fever.  HENT: Negative for congestion, hearing loss, tinnitus, trouble swallowing and voice change.   Eyes: Negative for visual disturbance.  Respiratory: Negative for cough, chest tightness, shortness of breath and wheezing.   Cardiovascular: Negative for chest pain, palpitations and leg swelling.  Gastrointestinal: Positive for constipation. Negative for abdominal pain, diarrhea and vomiting.  Endocrine: Negative for polydipsia and polyuria.  Genitourinary: Negative for dysuria, frequency, genital sores, vaginal bleeding and vaginal  discharge.  Musculoskeletal: Negative for arthralgias, gait problem and joint swelling.  Skin: Negative for color change and rash.  Neurological: Negative for dizziness, tremors, light-headedness and headaches.  Hematological: Negative for adenopathy. Does not bruise/bleed easily.  Psychiatric/Behavioral: Positive for depression. Negative for dysphoric mood and sleep disturbance. The patient is not nervous/anxious.     Patient Active Problem List   Diagnosis Date Noted  . History of arthrodesis 08/29/2017  . Depression 08/29/2017  . Pap smear abnormality of cervix with ASCUS favoring benign 08/14/2017  . Drug-induced constipation 07/20/2017  . Neck pain 02/05/2017  . Absolute anemia 06/09/2016  . 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  . Chronic tension-type headache, intractable 05/26/2015  . DDD (degenerative disc disease), cervical 02/09/2015  . Cervical fusion syndrome 02/09/2015  . Cervical facet syndrome 02/09/2015  . Bilateral occipital neuralgia 02/09/2015  . DDD (degenerative disc disease), 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/2015  . DDD (degenerative disc disease), lumbar 02/09/2015  . Generalized anxiety disorder 01/08/2015  . 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 Worsening of neck pain following surgery 01/08/2015  . Acquired hypothyroidism 11/15/2014  . Irritable bowel syndrome with constipation 11/15/2014  . OP (osteoporosis) 11/15/2014  . 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 Family planning 11/15/2014    Allergies  Allergen Reactions  . Codeine Nausea Only  . Cymbalta [Duloxetine Hcl] Nausea Only    Past Surgical History:  Procedure Laterality Date  . CERVICAL FUSION  [address]  . ECTOPIC PREGNANCY SURGERY     x 2  . SHOULDER ARTHROSCOPY Right 2014    Social History   Tobacco Use  . Smoking  status: Former Smoker    Last attempt to quit: 04/10/2004    Years since quitting: 13.9  . Smokeless tobacco: Never Used  Substance Use Topics  . Alcohol use: Yes     Alcohol/week: 2.0 standard drinks    Types: 2 Standard drinks or equivalent per week    Comment: ocassional  . Drug use: No     Medication list has been reviewed and updated.  Current Meds  Medication Sig  . cyanocobalamin (,VITAMIN B-12,) 1000 MCG/ML injection Inject 1 mL (1,000 mcg total) into the muscle every 30 (thirty) days.  . cyclobenzaprine (FLEXERIL) 10 MG tablet Limit one half to one tab by mouth per day if tolerated  . frovatriptan (FROVA) 2.5 MG tablet TAKE 1 TABLET BY MOUTH AS NEEDED FOR MIGRAINE. IF RECURS, MAY REPEAT IN 2 HOURS. MAX 3 IN 24 HOURS.  Marland Kitchen. JUNEL FE 1/20 1-20 MG-MCG tablet TAKE 1 TABLET BY MOUTH EVERY DAY( ALTERNATE FOR NORETHINDRONE/ETH FE)  . oxyCODONE-acetaminophen (PERCOCET/ROXICET) 5-325 MG tablet Limit 1 tablet by mouth per day or twice per day if tolerated  . sertraline (ZOLOFT) 50 MG tablet Take 1 tablet (50 mg total) by mouth daily.  Marland Kitchen. topiramate (TOPAMAX) 25 MG tablet Take 75 mg by mouth daily.     PHQ 2/9 Scores 03/21/2018 10/10/2017 08/29/2017 06/12/2017  PHQ - 2 Score 2 0 4 0  PHQ- 9 Score 12 6 18  0  Exception Documentation - - - -    Physical Exam Vitals signs and nursing note reviewed.  Constitutional:      General: She is not in acute distress.    Appearance: She is well-developed.  HENT:     Head: Normocephalic and atraumatic.     Right Ear: Tympanic membrane and ear canal normal.     Left Ear: Tympanic membrane and ear canal normal.     Nose:     Right Sinus: No maxillary sinus tenderness.     Left Sinus: No maxillary sinus tenderness.     Mouth/Throat:     Pharynx: Uvula midline.  Eyes:     General: No scleral icterus.       Right eye: No discharge.        Left eye: No discharge.     Conjunctiva/sclera: Conjunctivae normal.  Neck:     Musculoskeletal: Normal range of motion. No erythema.     Thyroid: No thyromegaly.     Vascular: No carotid bruit.  Cardiovascular:     Rate and Rhythm: Normal rate and regular rhythm.     Pulses:  Normal pulses.     Heart sounds: Normal heart sounds.  Pulmonary:     Effort: Pulmonary effort is normal. No respiratory distress.     Breath sounds: No wheezing.  Chest:     Breasts:        Right: No mass, nipple discharge, skin change or tenderness.        Left: No mass, nipple discharge, skin change or tenderness.  Abdominal:     General: Bowel sounds are normal.     Palpations: Abdomen is soft.     Tenderness: There is no abdominal tenderness.  Genitourinary:    Labia:        Right: No rash, tenderness or lesion.        Left: No rash, tenderness or lesion.      Cervix: Erythema present. No discharge, lesion or cervical bleeding.     Uterus: Normal.      Adnexa: Right adnexa  normal and left adnexa normal.  Musculoskeletal: Normal range of motion.       Feet:  Lymphadenopathy:     Cervical: No cervical adenopathy.  Skin:    General: Skin is warm and dry.     Findings: No rash.  Neurological:     Mental Status: She is alert and oriented to person, place, and time.     Cranial Nerves: No cranial nerve deficit.     Sensory: No sensory deficit.     Deep Tendon Reflexes: Reflexes are normal and symmetric.  Psychiatric:        Speech: Speech normal.        Behavior: Behavior normal.        Thought Content: Thought content normal.     BP 102/78 (BP Location: Right Arm, Patient Position: Sitting, Cuff Size: Normal)   Pulse 99   Ht 5\' 4"  (1.626 m)   Wt 164 lb (74.4 kg)   LMP 03/13/2018 (Within Days)   SpO2 97%   BMI 28.15 kg/m   Assessment and Plan: 1. Annual physical exam Normal exam except for weight - work on diet, exercise if able - Lipid panel - POCT urinalysis dipstick  2. Encounter for screening mammogram for breast cancer - MM 3D SCREEN BREAST BILATERAL; Future  3. Pap smear abnormality of cervix with ASCUS favoring benign completed - Cytology - PAP  4. Migraine aura without headache unchanged  5. Irritable bowel syndrome with constipation Movantik  not covered - will resume linzess - CBC with Differential/Platelet - Comprehensive metabolic panel - linaclotide (LINZESS) 290 MCG CAPS capsule; Take 1 capsule (290 mcg total) by mouth daily before breakfast.  Dispense: 30 capsule; Refill: 5  6. Moderate episode of recurrent major depressive disorder (HCC) Check labs Increase zoloft to 100 mg per day Recheck in 6 weeks - TSH  7. Right foot pain - Ambulatory referral to Podiatry  8. Other vitamin B12 deficiency anemia Continue monthly injections - cyanocobalamin (,VITAMIN B-12,) 1000 MCG/ML injection; Inject 1 mL (1,000 mcg total) into the muscle every 30 (thirty) days.  Dispense: 1 mL; Refill: 12   Partially dictated using Animal nutritionist. Any errors are unintentional.  Bari Edward, MD [facility]  03/21/2018

## 2018-03-22 ENCOUNTER — Telehealth: Payer: Self-pay

## 2018-03-22 LAB — CBC WITH DIFFERENTIAL/PLATELET
Basophils Absolute: 0.1 10*3/uL (ref 0.0–0.2)
Basos: 1 %
EOS (ABSOLUTE): 0.1 10*3/uL (ref 0.0–0.4)
EOS: 1 %
HEMATOCRIT: 38.3 % (ref 34.0–46.6)
Hemoglobin: 12.7 g/dL (ref 11.1–15.9)
Immature Grans (Abs): 0 10*3/uL (ref 0.0–0.1)
Immature Granulocytes: 1 %
Lymphocytes Absolute: 2 10*3/uL (ref 0.7–3.1)
Lymphs: 24 %
MCH: 32.1 pg (ref 26.6–33.0)
MCHC: 33.2 g/dL (ref 31.5–35.7)
MCV: 97 fL (ref 79–97)
MONOCYTES: 7 %
MONOS ABS: 0.6 10*3/uL (ref 0.1–0.9)
Neutrophils Absolute: 5.4 10*3/uL (ref 1.4–7.0)
Neutrophils: 66 %
Platelets: 393 10*3/uL (ref 150–450)
RBC: 3.96 x10E6/uL (ref 3.77–5.28)
RDW: 12.1 % — AB (ref 12.3–15.4)
WBC: 8.1 10*3/uL (ref 3.4–10.8)

## 2018-03-22 LAB — COMPREHENSIVE METABOLIC PANEL
ALT: 52 IU/L — AB (ref 0–32)
AST: 47 IU/L — AB (ref 0–40)
Albumin/Globulin Ratio: 1.8 (ref 1.2–2.2)
Albumin: 4.3 g/dL (ref 3.5–5.5)
Alkaline Phosphatase: 119 IU/L — ABNORMAL HIGH (ref 39–117)
BUN/Creatinine Ratio: 16 (ref 9–23)
BUN: 16 mg/dL (ref 6–24)
Bilirubin Total: 0.3 mg/dL (ref 0.0–1.2)
CO2: 18 mmol/L — AB (ref 20–29)
CREATININE: 1 mg/dL (ref 0.57–1.00)
Calcium: 8.9 mg/dL (ref 8.7–10.2)
Chloride: 107 mmol/L — ABNORMAL HIGH (ref 96–106)
GFR calc Af Amer: 80 mL/min/{1.73_m2} (ref >59)
GFR, EST NON AFRICAN AMERICAN: 69 mL/min/{1.73_m2} (ref >59)
GLOBULIN, TOTAL: 2.4 g/dL (ref 1.5–4.5)
Glucose: 91 mg/dL (ref 65–99)
Potassium: 4.6 mmol/L (ref 3.5–5.2)
Sodium: 141 mmol/L (ref 134–144)
Total Protein: 6.7 g/dL (ref 6.0–8.5)

## 2018-03-22 LAB — LIPID PANEL
CHOL/HDL RATIO: 4.8 ratio — AB (ref 0.0–4.4)
Cholesterol, Total: 212 mg/dL — ABNORMAL HIGH (ref 100–199)
HDL: 44 mg/dL (ref >39)
LDL CALC: 136 mg/dL — AB (ref 0–99)
TRIGLYCERIDES: 160 mg/dL — AB (ref 0–149)
VLDL Cholesterol Cal: 32 mg/dL (ref 5–40)

## 2018-03-22 LAB — TSH: TSH: 1.98 u[IU]/mL (ref 0.450–4.500)

## 2018-03-22 LAB — CYTOLOGY - PAP
Diagnosis: NEGATIVE
HPV: NOT DETECTED

## 2018-03-22 NOTE — Telephone Encounter (Signed)
Please look for Prior Auth to come from pharmacy. Patient said call pharmacy for PA and I have waited for PA form from them.

## 2018-03-23 ENCOUNTER — Encounter: Payer: Self-pay | Admitting: Internal Medicine

## 2018-03-23 DIAGNOSIS — E782 Mixed hyperlipidemia: Secondary | ICD-10-CM | POA: Insufficient documentation

## 2018-03-25 NOTE — Telephone Encounter (Signed)
Still have not received fax for this? Do you know what medication she is referring to? Thanks.

## 2018-03-25 NOTE — Telephone Encounter (Signed)
She didn't say on the message...she just said to look out for it

## 2018-03-26 LAB — SPECIMEN STATUS REPORT

## 2018-03-26 LAB — HEPATITIS PANEL, ACUTE
Hep A IgM: NEGATIVE
Hep B C IgM: NEGATIVE
Hep C Virus Ab: <0.1 s/co ratio (ref 0.0–0.9)
Hepatitis B Surface Ag: NEGATIVE

## 2018-03-26 NOTE — Telephone Encounter (Signed)
Okay thank you

## 2018-03-29 ENCOUNTER — Telehealth: Payer: Self-pay

## 2018-03-29 NOTE — Telephone Encounter (Signed)
PA completed through covermymeds for patients Linzess 290 mcg. They are approved through 03/29/2019. Called and informed patient and scanned documents in chart.

## 2018-04-01 DIAGNOSIS — M79671 Pain in right foot: Secondary | ICD-10-CM | POA: Diagnosis not present

## 2018-04-01 DIAGNOSIS — M2021 Hallux rigidus, right foot: Secondary | ICD-10-CM | POA: Diagnosis not present

## 2018-04-15 ENCOUNTER — Other Ambulatory Visit: Payer: Self-pay | Admitting: Internal Medicine

## 2018-04-15 DIAGNOSIS — F331 Major depressive disorder, recurrent, moderate: Secondary | ICD-10-CM

## 2018-04-16 DIAGNOSIS — M5481 Occipital neuralgia: Secondary | ICD-10-CM | POA: Diagnosis not present

## 2018-04-16 DIAGNOSIS — M542 Cervicalgia: Secondary | ICD-10-CM | POA: Diagnosis not present

## 2018-04-16 DIAGNOSIS — G894 Chronic pain syndrome: Secondary | ICD-10-CM | POA: Diagnosis not present

## 2018-05-02 ENCOUNTER — Ambulatory Visit: Payer: 59 | Admitting: Internal Medicine

## 2018-05-02 ENCOUNTER — Encounter: Payer: Self-pay | Admitting: Internal Medicine

## 2018-05-02 VITALS — BP 118/68 | HR 83 | Ht 64.0 in | Wt 167.0 lb

## 2018-05-02 DIAGNOSIS — F331 Major depressive disorder, recurrent, moderate: Secondary | ICD-10-CM | POA: Diagnosis not present

## 2018-05-02 DIAGNOSIS — J069 Acute upper respiratory infection, unspecified: Secondary | ICD-10-CM | POA: Diagnosis not present

## 2018-05-02 MED ORDER — SERTRALINE HCL 50 MG PO TABS: 75.0000 mg | ORAL_TABLET | Freq: Every day | ORAL | 5 refills | Status: DC

## 2018-05-02 NOTE — Progress Notes (Signed)
Date:  05/02/2018   Name:  Tanaiyah Christie   DOB:  June 10, 1975   MRN:  [id number]   Chief Complaint: Depression (Changed dose on Zoloft. Follow up. PHQ9- ) and Nasal Congestion (Started Sunday. Cold chills or hot flashes. No appetite. Moving from face to chest. )  Depression         This is a new problem.  The current episode started more than 1 month ago.   Associated symptoms include no suicidal ideas.  Past treatments include SSRIs - Selective serotonin reuptake inhibitors (sertraline 100 mg started last visit). Sinus Problem  This is a new problem. The current episode started in the past 7 days. The problem is unchanged. There has been no fever. She is experiencing no pain. Associated symptoms include congestion and a sore throat. Pertinent negatives include no chills, coughing, diaphoresis, ear pain, shortness of breath or sinus pressure. Treatments tried: over the counter cold medications. The treatment provided mild relief.    Review of Systems  Constitutional: Negative for chills, diaphoresis, fever and unexpected weight change.  HENT: Positive for congestion, postnasal drip and sore throat. Negative for ear pain, sinus pressure and trouble swallowing.   Respiratory: Negative for cough, chest tightness, shortness of breath and wheezing.   Cardiovascular: Negative for chest pain and palpitations.  Neurological: Negative for dizziness and light-headedness.  Psychiatric/Behavioral: Positive for depression. Negative for dysphoric mood, sleep disturbance and suicidal ideas. The patient is not nervous/anxious.     Patient Active Problem List   Diagnosis Date Noted  . Hyperlipidemia, mixed 03/23/2018  . History of arthrodesis 08/29/2017  . Major depressive disorder with current active episode 08/29/2017  . Pap smear abnormality of cervix with ASCUS favoring benign 08/14/2017  . Drug-induced constipation 07/20/2017  . 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  . Chronic tension-type headache,  intractable 05/26/2015  . DDD (degenerative disc disease), cervical 02/09/2015  . Cervical fusion syndrome 02/09/2015  . Cervical facet syndrome 02/09/2015  . Bilateral occipital neuralgia 02/09/2015  . DDD (degenerative disc disease), 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/2015  . DDD (degenerative disc disease), lumbar 02/09/2015  . Generalized anxiety disorder 01/08/2015  . 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 Acquired hypothyroidism 11/15/2014  . Irritable bowel syndrome with constipation 11/15/2014  . OP (osteoporosis) 11/15/2014  . Family planning 11/15/2014    Allergies  Allergen Reactions  . Codeine Nausea Only  . Cymbalta [Duloxetine Hcl] Nausea Only    Past Surgical History:  Procedure Laterality Date  . [address]ICAL FUSION  [address]  . ECTOPIC PREGNANCY SURGERY     x 2  . SHOULDER ARTHROSCOPY Right 2014    Social History   Tobacco Use  . Smoking status: Former Smoker    Last attempt to quit: 04/10/2004    Years since quitting: 14.0  . Smokeless tobacco: Never Used  Substance Use Topics  . Alcohol use: Yes    Alcohol/week: 2.0 standard drinks    Types: 2 Standard drinks or equivalent per week    Comment: ocassional  . Drug use: No     Medication list has been reviewed and updated.  Current Meds  Medication Sig  . cyanocobalamin (,VITAMIN B-12,) 1000 MCG/ML injection Inject 1 mL (1,000 mcg total) into the muscle every 30 (thirty) days.  . frovatriptan (FROVA) 2.5 MG tablet TAKE 1 TABLET BY MOUTH AS NEEDED FOR MIGRAINE. IF RECURS, MAY REPEAT IN 2 HOURS. MAX 3 IN 24 HOURS.  Marland Kitchen JUNEL FE 1/20 1-20 MG-MCG tablet TAKE 1 TABLET  BY MOUTH EVERY DAY( ALTERNATE FOR NORETHINDRONE/ETH FE)  . linaclotide (LINZESS) 290 MCG CAPS capsule Take 1 capsule (290 mcg total) by mouth daily before breakfast.  . oxyCODONE-acetaminophen (PERCOCET/ROXICET) 5-325 MG tablet Limit 1 tablet by mouth per day or twice per day if tolerated  . rizatriptan (MAXALT-MLT) 10 MG disintegrating  tablet DISSOLVE 1 TABLET IN MOUTH ONCE DAILY AS NEEDED FOR MIGRAINE MAY TAKE A SECOND DOSE AFTER 2 HOURS IF NEEDED  . sertraline (ZOLOFT) 50 MG tablet TAKE 1 TABLET BY MOUTH ONCE DAILY  . tiZANidine (ZANAFLEX) 2 MG tablet TAKE 1 TO 2 TABLETS BY MOUTH IN THE EVENING IF TOLERATED. NO CYCLOBENZAPRINE (FLEXERIL)  . topiramate (TOPAMAX) 25 MG tablet Take 75 mg by mouth daily.     PHQ 2/9 Scores 05/02/2018 03/21/2018 10/10/2017 08/29/2017  PHQ - 2 Score 0 2 0 4  PHQ- 9 Score 3 12 6 18   Exception Documentation - - - -      Physical Exam Vitals signs and nursing note reviewed.  Constitutional:      General: She is not in acute distress.    Appearance: She is well-developed.  HENT:     Head: Normocephalic and atraumatic.     Right Ear: Tympanic membrane and ear canal normal.     Left Ear: Ear canal normal. A middle ear effusion is present.     Nose:     Right Sinus: No maxillary sinus tenderness or frontal sinus tenderness.     Left Sinus: No maxillary sinus tenderness or frontal sinus tenderness.     Mouth/Throat:     Pharynx: No posterior oropharyngeal erythema.  Neck:     Musculoskeletal: Normal range of motion and neck supple.  Cardiovascular:     Rate and Rhythm: Normal rate and regular rhythm.     Pulses: Normal pulses.     Heart sounds: Normal heart sounds.  Pulmonary:     Effort: Pulmonary effort is normal. No respiratory distress.     Breath sounds: Normal breath sounds and air entry.  Musculoskeletal: Normal range of motion.  Skin:    General: Skin is warm and dry.     Findings: No rash.  Neurological:     Mental Status: She is alert and oriented to person, place, and time.  Psychiatric:        Attention and Perception: Attention normal.        Mood and Affect: Mood normal.        Behavior: Behavior normal.        Thought Content: Thought content normal.     BP 118/68   Pulse 83   Ht 5\' 4"  (1.626 m)   Wt 167 lb (75.8 kg)   LMP 05/04/2017 (Exact Date)   SpO2 97%    BMI 28.67 kg/m   Assessment and Plan: 1. Moderate episode of recurrent major depressive disorder (HCC) Increase sertraline to 75 mg daily - sertraline (ZOLOFT) 50 MG tablet; Take 1.5 tablets (75 mg total) by mouth daily.  Dispense: 45 tablet; Refill: 5  2. Viral URI Continue over the counter cold medication and cough syrup If sx progress over the next week, call for antibiotic therapy   Partially dictated using Animal nutritionist. Any errors are unintentional.  Bari Edward, MD [facility]  05/02/2018

## 2018-05-02 NOTE — Patient Instructions (Signed)
[facility]  [facility]

## 2018-05-10 ENCOUNTER — Encounter: Payer: Self-pay | Admitting: Obstetrics & Gynecology

## 2018-05-14 DIAGNOSIS — G894 Chronic pain syndrome: Secondary | ICD-10-CM | POA: Diagnosis not present

## 2018-05-14 DIAGNOSIS — M5481 Occipital neuralgia: Secondary | ICD-10-CM | POA: Diagnosis not present

## 2018-05-14 DIAGNOSIS — M542 Cervicalgia: Secondary | ICD-10-CM | POA: Diagnosis not present

## 2018-05-15 ENCOUNTER — Ambulatory Visit (INDEPENDENT_AMBULATORY_CARE_PROVIDER_SITE_OTHER): Payer: 59 | Admitting: Obstetrics & Gynecology

## 2018-05-15 ENCOUNTER — Encounter: Payer: Self-pay | Admitting: Obstetrics & Gynecology

## 2018-05-15 ENCOUNTER — Telehealth: Payer: Self-pay | Admitting: Obstetrics & Gynecology

## 2018-05-15 VITALS — BP 120/80 | Ht 64.0 in | Wt 173.0 lb

## 2018-05-15 DIAGNOSIS — N9419 Other specified dyspareunia: Secondary | ICD-10-CM | POA: Diagnosis not present

## 2018-05-15 DIAGNOSIS — Z3009 Encounter for other general counseling and advice on contraception: Secondary | ICD-10-CM

## 2018-05-15 NOTE — Progress Notes (Signed)
Contraception Counseling Patient presents for contraception counseling. The patient has complaints today of taking the OCP Junel for 15 years, and feels more bloated and "off" taking this pill.  Strongly desires no more pregnancies, as she has a 43 yo and has had 2 Ectopic pregnancies requiring surgeries. The patient is sexually active. Pertinent past medical history: none.   She also reports dyspareunia, especially deep.  Concern for adhesions from surgeries and one of the ectopics ruptured prior to surgery.  No h/o endoemtriosis or IC.  Pain does not radiate, is moderate and midline, and no real modifiers or other assoc sx's.  PMHx: She  has a past medical history of Absolute anemia (06/09/2016), Addison anemia (11/15/2014), Anemia, Anxiety, and Migraine (has had since cervical fusion 2015). Also,  has a past surgical history that includes Cervical fusion (2015); Shoulder arthroscopy (Right, 2014); and Ectopic pregnancy surgery., family history includes CAD in her father; Diabetes in her father; Thyroid disease in her mother.,  reports that she quit smoking about 14 years ago. She has never used smokeless tobacco. She reports current alcohol use of about 2.0 standard drinks of alcohol per week. She reports that she does not use drugs.  She has a current medication list which includes the following prescription(s): cyanocobalamin, frovatriptan, junel fe 1/20, linaclotide, oxycodone-acetaminophen, rizatriptan, sertraline, tizanidine, and topiramate. Also, is allergic to codeine and cymbalta [duloxetine hcl].  Review of Systems  Constitutional: Negative for chills, fever and malaise/fatigue.  HENT: Negative for congestion, sinus pain and sore throat.   Eyes: Negative for blurred vision and pain.  Respiratory: Negative for cough and wheezing.   Cardiovascular: Negative for chest pain and leg swelling.  Gastrointestinal: Negative for abdominal pain, constipation, diarrhea, heartburn, nausea and vomiting.    Genitourinary: Negative for dysuria, frequency, hematuria and urgency.  Musculoskeletal: Negative for back pain, joint pain, myalgias and neck pain.  Skin: Negative for itching and rash.  Neurological: Negative for dizziness, tremors and weakness.  Endo/Heme/Allergies: Does not bruise/bleed easily.  Psychiatric/Behavioral: Negative for depression. The patient is not nervous/anxious and does not have insomnia.     Objective: BP 120/80   Ht 5\' 4"  (1.626 m)   Wt 173 lb (78.5 kg)   BMI 29.70 kg/m  Physical Exam Constitutional:      General: She is not in acute distress.    Appearance: She is well-developed.  HENT:     Head: Normocephalic and atraumatic. No laceration.     Right Ear: Hearing normal.     Left Ear: Hearing normal.     Mouth/Throat:     Pharynx: Uvula midline.  Eyes:     Pupils: Pupils are equal, round, and reactive to light.  Neck:     Musculoskeletal: Normal range of motion and neck supple.     Thyroid: No thyromegaly.  Cardiovascular:     Rate and Rhythm: Normal rate and regular rhythm.     Heart sounds: No murmur. No friction rub. No gallop.   Pulmonary:     Effort: Pulmonary effort is normal. No respiratory distress.     Breath sounds: Normal breath sounds. No wheezing.  Chest:     Breasts:        Right: No mass, skin change or tenderness.        Left: No mass, skin change or tenderness.  Abdominal:     General: Bowel sounds are normal. There is no distension.     Palpations: Abdomen is soft.     Tenderness: There is  no abdominal tenderness. There is no rebound.  Musculoskeletal: Normal range of motion.  Neurological:     Mental Status: She is alert and oriented to person, place, and time.     Cranial Nerves: No cranial nerve deficit.  Skin:    General: Skin is warm and dry.  Psychiatric:        Judgment: Judgment normal.  Vitals signs reviewed.   ASSESSMENT/PLAN:   Problem List Items Addressed This Visit      Other   Sterilization consult -  Primary   Dyspareunia due to medical condition in female    Options for laparoscopy and salpingectomy and evaluation for etiology for dyspareunia discussed. Pros and cons, info provided.  Time spent to discuss all options for contraception, as well as dyspareunia etiologies and approaches to treatment. A total of 60 minutes were spent face-to-face with the patient during this encounter and over half of that time dealt with counseling and coordination of care.  PAP UTD. MMG ordered, not done yet, per PCP.  Annamarie Major, MD, [facility], [facility] 05/15/2018  8:55 AM

## 2018-05-15 NOTE — Telephone Encounter (Signed)
Patient is aware of H&P at [facility] on Friday, 05/24/18 @ 3:10p.m. w/ Dr. Tiburcio Pea, Pre-admit Testing phone interview to be scheduled, and OR on 05/30/18. Patient is aware she may receive calls from the [facility] and [facility]. Patient confirmed [employer], and no secondary insurance. Patient was given the phone# to Same Day Surgery and is aware she should call the day before surgery between 1-3 p.m. for an arrival time. Patient is aware someone should drive her on the day of surgery. Per patient, she will not need pain meds after surgery, as she is already taking pain medication for her neck. Patient will discuss w/ Dr. Tiburcio Pea during H&P.

## 2018-05-15 NOTE — Patient Instructions (Signed)
What You Need to Know About Female Sterilization  Female sterilization is surgery to prevent pregnancy. In this surgery, the fallopian tubes are either blocked or closed off. This prevents eggs from reaching the uterus so that the eggs cannot be fertilized by sperm and you cannot get pregnant.  Sterilization is permanent. It should only be done if you are sure that you do not want to be able to have children.  What are the sterilization surgery options?  There are several kinds of female sterilization surgeries. They include:   Laparoscopic tubal ligation. In this surgery, the fallopian tubes are tied off, sealed with heat, or blocked with a clip, ring, or clamp. A small portion of each fallopian tube may also be removed. This surgery is done through several small cuts (incisions).   Postpartum tubal ligation. This is also called a mini-laparotomy. This surgery is done right after childbirth or 1 or 2 days after childbirth. In this surgery, the fallopian tubes are tied off, sealed with heat, or blocked with a clip, ring, or clamp. A small portion of each fallopian tube may also be removed. The surgery is done through a single incision.   Hysteroscopic sterilization. In this surgery, a tiny, spring-like coil is inserted through the cervix and uterus into the fallopian tubes. The coil causes scarring, which blocks the tubes. After the surgery, contraception should be used for 3 months to allow the scar tissue to form completely.  Is sterilization safe?  Generally, sterilization is safe. Complications are rare. However, there are risks. They include:   Bleeding.   Infection.   Reaction to medicine used during the procedure.   Injury to surrounding organs.   Failure of the procedure.  How effective is sterilization?  Sterilization is nearly 100% effective, but it can fail. Also, the fallopian tubes can grow back together over time. If this happens, you will be able to get pregnant again.  Women who have had this  procedure have a higher chance of having an ectopic pregnancy. An ectopic pregnancy is a pregnancy that happens outside of the uterus. This kind of pregnancy is unsuccessful and can lead to serious bleeding if it is not treated.  What are the benefits?   It is usually effective for a lifetime.   It is usually safe.   It does not have the drawbacks of other types of birth control: That means:  ? Your hormones are not affected. Because of this, your menstrual periods, sexual desire, and sexual performance will not be affected.  ? There are no side effects.  What are the drawbacks?   If you change your mind and decide that you want to have children, you may not be able to. Sterilization may be reversed, but a reversal is not always successful.   It does not provide protection against STDs (sexually transmitted diseases).   It increases the chance of having an ectopic pregnancy.  This information is not intended to replace advice given to you by your health care provider. Make sure you discuss any questions you have with your health care provider.  Document Released: 09/13/2007 Document Revised: 11/18/2015 Document Reviewed: 12/22/2014  Elsevier Interactive Patient Education  2019 [employer]

## 2018-05-15 NOTE — Telephone Encounter (Signed)
-----   Message from Nadara Mustard, MD sent at 05/15/2018  8:50 AM EST ----- Regarding: SURGERY Surgery Booking Request Patient Full Name:  Theresa Murphy  MRN: [id number]  DOB: Aug 23, 1975  Surgeon: Letitia Libra, MD  Requested Surgery Date and Time: 05/30/2018 Primary Diagnosis AND Code: Sterilization, Dyspareunia Secondary Diagnosis and Code:  Surgical Procedure: LAPAROSCOPY, SALPINGECTOMY BILATERAL L&D Notification: No Admission Status: same day surgery Length of Surgery: 30 min Special Case Needs: no H&P: yes (date) Phone Interview???: yes Interpreter: Language:  Medical Clearance: no Special Scheduling Instructions: no

## 2018-05-24 ENCOUNTER — Encounter: Payer: Self-pay | Admitting: Obstetrics & Gynecology

## 2018-05-24 ENCOUNTER — Ambulatory Visit (INDEPENDENT_AMBULATORY_CARE_PROVIDER_SITE_OTHER): Payer: 59 | Admitting: Obstetrics & Gynecology

## 2018-05-24 VITALS — BP 120/80 | Ht 64.0 in | Wt 173.0 lb

## 2018-05-24 DIAGNOSIS — Z3009 Encounter for other general counseling and advice on contraception: Secondary | ICD-10-CM

## 2018-05-24 DIAGNOSIS — N9419 Other specified dyspareunia: Secondary | ICD-10-CM

## 2018-05-24 NOTE — Patient Instructions (Signed)
Laparoscopic Tubal Ligation, Care After °Refer to this sheet in the next few weeks. These instructions provide you with information about caring for yourself after your procedure. Your health care provider may also give you more specific instructions. Your treatment has been planned according to current medical practices, but problems sometimes occur. Call your health care provider if you have any problems or questions after your procedure. °What can I expect after the procedure? °After the procedure, it is common to have: °· A sore throat. °· Discomfort in your shoulder. °· Mild discomfort or cramping in your abdomen. °· Gas pains. °· Pain or soreness in the area where the surgical cut (incision) was made. °· A bloated feeling. °· Tiredness. °· Nausea. °· Vomiting. °Follow these instructions at home: °Medicines °· Take over-the-counter and prescription medicines only as told by your health care provider. °· Do not take aspirin because it can cause bleeding. °· Do not drive or operate heavy machinery while taking prescription pain medicine. °Activity °· Rest for the rest of the day. °· Return to your normal activities as told by your health care provider. Ask your health care provider what activities are safe for you. °Incision care ° °  ° °· Follow instructions from your health care provider about how to take care of your incision. Make sure you: °? Wash your hands with soap and water before you change your bandage (dressing). If soap and water are not available, use hand sanitizer. °? Change your dressing as told by your health care provider. °? Leave stitches (sutures) in place. They may need to stay in place for 2 weeks or longer. °· Check your incision area every day for signs of infection. Check for: °? More redness, swelling, or pain. °? More fluid or blood. °? Warmth. °? Pus or a bad smell. °Other Instructions °· Do not take baths, swim, or use a hot tub until your health care provider approves. You may take  showers. °· Keep all follow-up visits as told by your health care provider. This is important. °· Have someone help you with your daily household tasks for the first few days. °Contact a health care provider if: °· You have more redness, swelling, or pain around your incision. °· Your incision feels warm to the touch. °· You have pus or a bad smell coming from your incision. °· The edges of your incision break open after the sutures have been removed. °· Your pain does not improve after 2-3 days. °· You have a rash. °· You repeatedly become dizzy or light-headed. °· Your pain medicine is not helping. °· You are constipated. °Get help right away if: °· You have a fever. °· You faint. °· You have increasing pain in your abdomen. °· You have severe pain in one or both of your shoulders. °· You have fluid or blood coming from your sutures or from your vagina. °· You have shortness of breath or difficulty breathing. °· You have chest pain or leg pain. °· You have ongoing nausea, vomiting, or diarrhea. °This information is not intended to replace advice given to you by your health care provider. Make sure you discuss any questions you have with your health care provider. °Document Released: 10/14/2004 Document Revised: 11/21/2016 Document Reviewed: 03/07/2015 °Elsevier Interactive Patient Education © 2019 [employer] Inc[employer] ° °

## 2018-05-24 NOTE — H&P (View-Only) (Signed)
  PRE-OPERATIVE HISTORY AND PHYSICAL EXAM  HPI:  Theresa Murphy is a 42 y.o. G3P1021 No LMP recorded.; she is being admitted for surgery related to requested sterilization.  The patient has complaints today of taking the OCP Junel for 15 years, and feels more bloated and "off" taking this pill.  Strongly desires no more pregnancies, as she has a 44 yo and has had 2 Ectopic pregnancies requiring surgeries.  Also has dyspareunia, deep, in need of investigation, which laparoscopy may help assist with determining etiology.  PMHx: Past Medical History:  Diagnosis Date  . Absolute anemia 06/09/2016  . 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 Anemia   . Anxiety   . Migraine has had since cervical fusion 2015   Past Surgical History:  Procedure Laterality Date  . CERVICAL FUSION  2015   [facility]  . ECTOPIC PREGNANCY SURGERY     x 2  . SHOULDER ARTHROSCOPY Right 2014   Family History  Problem Relation Age of Onset  . Thyroid disease Mother   . Diabetes Father   . CAD Father    Social History   Tobacco Use  . Smoking status: Former Smoker    Last attempt to quit: 04/10/2004    Years since quitting: 14.1  . Smokeless tobacco: Never Used  Substance Use Topics  . Alcohol use: Yes    Alcohol/week: 2.0 standard drinks    Types: 2 Standard drinks or equivalent per week    Comment: ocassional  . Drug use: No    Current Outpatient Medications:  .  cyanocobalamin (,VITAMIN B-12,) 1000 MCG/ML injection, Inject 1 mL (1,000 mcg total) into the muscle every 30 (thirty) days., Disp: 1 mL, Rfl: 12 .  frovatriptan (FROVA) 2.5 MG tablet, TAKE 1 TABLET BY MOUTH AS NEEDED FOR MIGRAINE. IF RECURS, MAY REPEAT IN 2 HOURS. MAX 3 IN 24 HOURS., Disp: 10 tablet, Rfl: 0 .  JUNEL FE 1/20 1-20 MG-MCG tablet, TAKE 1 TABLET BY MOUTH EVERY DAY( ALTERNATE FOR NORETHINDRONE/ETH FE) (Patient taking differently: Take 1 tablet by mouth daily. ), Disp: 84 tablet, Rfl: 3 .  linaclotide (LINZESS) 290 MCG CAPS capsule,  Take 1 capsule (290 mcg total) by mouth daily before breakfast., Disp: 30 capsule, Rfl: 5 .  oxyCODONE-acetaminophen (PERCOCET) 10-325 MG tablet, Take 1 tablet by mouth every 6 (six) hours as needed., Disp: , Rfl:  .  rizatriptan (MAXALT-MLT) 10 MG disintegrating tablet, Take 10 mg by mouth as needed for migraine. , Disp: , Rfl:  .  sertraline (ZOLOFT) 50 MG tablet, Take 1.5 tablets (75 mg total) by mouth daily. (Patient taking differently: Take 75-100 mg by mouth daily. ), Disp: 45 tablet, Rfl: 5 .  tiZANidine (ZANAFLEX) 2 MG tablet, Take 2 mg by mouth at bedtime as needed for muscle spasms. , Disp: , Rfl:  .  topiramate (TOPAMAX) 25 MG tablet, Take 75 mg by mouth at bedtime. , Disp: , Rfl:  Allergies: Codeine and Cymbalta [duloxetine hcl]  Review of Systems  Constitutional: Negative for chills, fever and malaise/fatigue.  HENT: Negative for congestion, sinus pain and sore throat.   Eyes: Negative for blurred vision and pain.  Respiratory: Negative for cough and wheezing.   Cardiovascular: Negative for chest pain and leg swelling.  Gastrointestinal: Negative for abdominal pain, constipation, diarrhea, heartburn, nausea and vomiting.  Genitourinary: Negative for dysuria, frequency, hematuria and urgency.  Musculoskeletal: Negative for back pain, joint pain, myalgias and neck pain.  Skin: Negative for itching and rash.  Neurological: Negative for   dizziness, tremors and weakness.  Endo/Heme/Allergies: Does not bruise/bleed easily.  Psychiatric/Behavioral: Negative for depression. The patient is not nervous/anxious and does not have insomnia.   All other systems reviewed and are negative.   Objective: BP 120/80   Ht 5\' 4"  (1.626 m)   Wt 173 lb (78.5 kg)   BMI 29.70 kg/m   Filed Weights   05/24/18 1458  Weight: 173 lb (78.5 kg)   Physical Exam Constitutional:      General: She is not in acute distress.    Appearance: She is well-developed.  HENT:     Head: Normocephalic and  atraumatic. No laceration.     Right Ear: Hearing normal.     Left Ear: Hearing normal.     Mouth/Throat:     Pharynx: Uvula midline.  Eyes:     Pupils: Pupils are equal, round, and reactive to light.  Neck:     Musculoskeletal: Normal range of motion and neck supple.     Thyroid: No thyromegaly.  Cardiovascular:     Rate and Rhythm: Normal rate and regular rhythm.     Heart sounds: No murmur. No friction rub. No gallop.   Pulmonary:     Effort: Pulmonary effort is normal. No respiratory distress.     Breath sounds: Normal breath sounds. No wheezing.  Chest:     Breasts:        Right: No mass, skin change or tenderness.        Left: No mass, skin change or tenderness.  Abdominal:     General: Bowel sounds are normal. There is no distension.     Palpations: Abdomen is soft.     Tenderness: There is no abdominal tenderness. There is no rebound.  Musculoskeletal: Normal range of motion.  Neurological:     Mental Status: She is alert and oriented to person, place, and time.     Cranial Nerves: No cranial nerve deficit.  Skin:    General: Skin is warm and dry.  Psychiatric:        Judgment: Judgment normal.  Vitals signs reviewed.   Assessment: 1. Sterilization consult   2. 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 I have fully informed this patient that surgery may subject her to a variety of discomforts and risks: She understands that most patients have surgery with little difficulty, but problems can happen ranging from minor to fatal. These include nausea, vomiting, pain, bleeding, infection, poor healing, hernia, or formation of adhesions. Unexpected reactions may occur from any drug or anesthetic given. Unintended injury may occur to other pelvic or abdominal structures  such as Fallopian tubes, ovaries, bladder, ureter (tube from kidney to bladder), or bowel. Nerves going from the pelvis to the legs may be injured. Any such injury may require immediate or later additional surgery to correct the problem. Excessive blood loss requiring transfusion is very unlikely but possible. Dangerous blood clots may form in the legs or lungs. Physical and sexual activity will be restricted in varying degrees for an indeterminate period of time but most often 2-6 weeks.  Finally, she understands that it is impossible to list every possible undesirable effect and that the condition for which surgery is done is not always cured or significantly improved, and in rare cases may be even  worse.Ample time was given to answer all questions.  Annamarie Major, MD, [facility], [facility] 05/24/2018  3:13 PM

## 2018-05-24 NOTE — Progress Notes (Signed)
PRE-OPERATIVE HISTORY AND PHYSICAL EXAM  HPI:  Theresa Murphy is a 43 y.o. [id number] No LMP recorded.; she is being admitted for surgery related to requested sterilization.  The patient has complaints today of taking the OCP Junel for 15 years, and feels more bloated and "off" taking this pill.  Strongly desires no more pregnancies, as she has a 43 yo and has had 2 Ectopic pregnancies requiring surgeries.  Also has dyspareunia, deep, in need of investigation, which laparoscopy may help assist with determining etiology.  PMHx: Past Medical History:  Diagnosis Date  . Absolute anemia 06/09/2016  . 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 Anemia   . Anxiety   . Migraine has had since cervical fusion 2015   Past Surgical History:  Procedure Laterality Date  . CERVICAL FUSION  [address]2015   [facility]  . ECTOPIC PREGNANCY SURGERY     x 2  . SHOULDER ARTHROSCOPY Right 2014   Family History  Problem Relation Age of Onset  . Thyroid disease Mother   . Diabetes Father   . CAD Father    Social History   Tobacco Use  . Smoking status: Former Smoker    Last attempt to quit: 04/10/2004    Years since quitting: 14.1  . Smokeless tobacco: Never Used  Substance Use Topics  . Alcohol use: Yes    Alcohol/week: 2.0 standard drinks    Types: 2 Standard drinks or equivalent per week    Comment: ocassional  . Drug use: No    Current Outpatient Medications:  .  cyanocobalamin (,VITAMIN B-12,) 1000 MCG/ML injection, Inject 1 mL (1,000 mcg total) into the muscle every 30 (thirty) days., Disp: 1 mL, Rfl: 12 .  frovatriptan (FROVA) 2.5 MG tablet, TAKE 1 TABLET BY MOUTH AS NEEDED FOR MIGRAINE. IF RECURS, MAY REPEAT IN 2 HOURS. MAX 3 IN 24 HOURS., Disp: 10 tablet, Rfl: 0 .  JUNEL FE 1/20 1-20 MG-MCG tablet, TAKE 1 TABLET BY MOUTH EVERY DAY( ALTERNATE FOR NORETHINDRONE/ETH FE) (Patient taking differently: Take 1 tablet by mouth daily. ), Disp: 84 tablet, Rfl: 3 .  linaclotide (LINZESS) 290 MCG CAPS capsule,  Take 1 capsule (290 mcg total) by mouth daily before breakfast., Disp: 30 capsule, Rfl: 5 .  oxyCODONE-acetaminophen (PERCOCET) 10-325 MG tablet, Take 1 tablet by mouth every 6 (six) hours as needed., Disp: , Rfl:  .  rizatriptan (MAXALT-MLT) 10 MG disintegrating tablet, Take 10 mg by mouth as needed for migraine. , Disp: , Rfl:  .  sertraline (ZOLOFT) 50 MG tablet, Take 1.5 tablets (75 mg total) by mouth daily. (Patient taking differently: Take 75-100 mg by mouth daily. ), Disp: 45 tablet, Rfl: 5 .  tiZANidine (ZANAFLEX) 2 MG tablet, Take 2 mg by mouth at bedtime as needed for muscle spasms. , Disp: , Rfl:  .  topiramate (TOPAMAX) 25 MG tablet, Take 75 mg by mouth at bedtime. , Disp: , Rfl:  Allergies: Codeine and Cymbalta [duloxetine hcl]  Review of Systems  Constitutional: Negative for chills, fever and malaise/fatigue.  HENT: Negative for congestion, sinus pain and sore throat.   Eyes: Negative for blurred vision and pain.  Respiratory: Negative for cough and wheezing.   Cardiovascular: Negative for chest pain and leg swelling.  Gastrointestinal: Negative for abdominal pain, constipation, diarrhea, heartburn, nausea and vomiting.  Genitourinary: Negative for dysuria, frequency, hematuria and urgency.  Musculoskeletal: Negative for back pain, joint pain, myalgias and neck pain.  Skin: Negative for itching and rash.  Neurological: Negative for  dizziness, tremors and weakness.  Endo/Heme/Allergies: Does not bruise/bleed easily.  Psychiatric/Behavioral: Negative for depression. The patient is not nervous/anxious and does not have insomnia.   All other systems reviewed and are negative.   Objective: BP 120/80   Ht 5\' 4"  (1.626 m)   Wt 173 lb (78.5 kg)   BMI 29.70 kg/m   Filed Weights   05/24/18 1458  Weight: 173 lb (78.5 kg)   Physical Exam Constitutional:      General: She is not in acute distress.    Appearance: She is well-developed.  HENT:     Head: Normocephalic and  atraumatic. No laceration.     Right Ear: Hearing normal.     Left Ear: Hearing normal.     Mouth/Throat:     Pharynx: Uvula midline.  Eyes:     Pupils: Pupils are equal, round, and reactive to light.  Neck:     Musculoskeletal: Normal range of motion and neck supple.     Thyroid: No thyromegaly.  Cardiovascular:     Rate and Rhythm: Normal rate and regular rhythm.     Heart sounds: No murmur. No friction rub. No gallop.   Pulmonary:     Effort: Pulmonary effort is normal. No respiratory distress.     Breath sounds: Normal breath sounds. No wheezing.  Chest:     Breasts:        Right: No mass, skin change or tenderness.        Left: No mass, skin change or tenderness.  Abdominal:     General: Bowel sounds are normal. There is no distension.     Palpations: Abdomen is soft.     Tenderness: There is no abdominal tenderness. There is no rebound.  Musculoskeletal: Normal range of motion.  Neurological:     Mental Status: She is alert and oriented to person, place, and time.     Cranial Nerves: No cranial nerve deficit.  Skin:    General: Skin is warm and dry.  Psychiatric:        Judgment: Judgment normal.  Vitals signs reviewed.   Assessment: 1. Sterilization consult   2. 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 I have fully informed this patient that surgery may subject her to a variety of discomforts and risks: She understands that most patients have surgery with little difficulty, but problems can happen ranging from minor to fatal. These include nausea, vomiting, pain, bleeding, infection, poor healing, hernia, or formation of adhesions. Unexpected reactions may occur from any drug or anesthetic given. Unintended injury may occur to other pelvic or abdominal structures  such as Fallopian tubes, ovaries, bladder, ureter (tube from kidney to bladder), or bowel. Nerves going from the pelvis to the legs may be injured. Any such injury may require immediate or later additional surgery to correct the problem. Excessive blood loss requiring transfusion is very unlikely but possible. Dangerous blood clots may form in the legs or lungs. Physical and sexual activity will be restricted in varying degrees for an indeterminate period of time but most often 2-6 weeks.  Finally, she understands that it is impossible to list every possible undesirable effect and that the condition for which surgery is done is not always cured or significantly improved, and in rare cases may be even  worse.Ample time was given to answer all questions.  Annamarie Major, MD, [facility], [facility] 05/24/2018  3:13 PM

## 2018-05-27 ENCOUNTER — Other Ambulatory Visit: Payer: Self-pay

## 2018-05-27 ENCOUNTER — Encounter
Admission: RE | Admit: 2018-05-27 | Discharge: 2018-05-27 | Disposition: A | Discharge status: Home / Self Care | Payer: 59 | Source: Ambulatory Visit | Attending: Obstetrics & Gynecology | Admitting: Obstetrics & Gynecology

## 2018-05-27 HISTORY — DX: Unspecified osteoarthritis, unspecified site: M19.90

## 2018-05-27 HISTORY — DX: Chronic kidney disease, unspecified: N18.9

## 2018-05-27 NOTE — Patient Instructions (Signed)
Your procedure is scheduled on: 05-30-18 THURSDAY Report to Same Day Surgery 2nd floor medical mall [facility] Entrance-take elevator on left to 2nd floor.  Check in with surgery information desk.) To find out your arrival time please call [phone] between 1PM - 3PM on 05-29-18 [facility]  Remember: Instructions that are not followed completely may result in serious medical risk, up to and including death, or upon the discretion of your surgeon and anesthesiologist your surgery may need to be rescheduled.    _x___ 1. Do not eat food after midnight the night before your procedure. NO GUM OR CANDY AFTER MIDNIGHT.  You may drink clear liquids up to 2 hours before you are scheduled to arrive at the hospital for your procedure.  Do not drink clear liquids within 2 hours of your scheduled arrival to the hospital.  Clear liquids include  --Water or Apple juice without pulp  --Clear carbohydrate beverage such as ClearFast or Gatorade  --Black Coffee or Clear Tea (No milk, no creamers, do not add anything to the coffee or Tea   ____Ensure clear carbohydrate drink on the way to the hospital for bariatric patients  _X___Ensure clear carbohydrate drink 3 hours before surgery     __x__ 2. No Alcohol for 24 hours before or after surgery.   __x__3. No Smoking or e-cigarettes for 24 prior to surgery.  Do not use any chewable tobacco products for at least 6 hour prior to surgery   ____  4. Bring all medications with you on the day of surgery if instructed.    __x__ 5. Notify your doctor if there is any change in your medical condition     (cold, fever, infections).    x___6. On the morning of surgery brush your teeth with toothpaste and water.  You may rinse your mouth with mouth wash if you wish.  Do not swallow any toothpaste or mouthwash.   Do not wear jewelry, make-up, hairpins, clips or nail polish.  Do not wear lotions, powders, or perfumes. You may wear deodorant.  Do not shave 48 hours  prior to surgery. Men may shave face and neck.  Do not bring valuables to the hospital.    [facility] is not responsible for any belongings or valuables.               Contacts, dentures or bridgework may not be worn into surgery.  Leave your suitcase in the car. After surgery it may be brought to your room.  For patients admitted to the hospital, discharge time is determined by your treatment team.  _  Patients discharged the day of surgery will not be allowed to drive home.  You will need someone to drive you home and stay with you the night of your procedure.    Please read over the following fact sheets that you were given:   [facility] Preparing for Surgery AND INCENTIVE SPIROMETER  _x___ TAKE THE FOLLOWING MEDICATION THE MORNING OF SURGERY WITH A SMALL SIP OF WATER. These include:  1. ZOLOFT (SERTRALINE)  2. YOU MAY TAKE PERCOCET DAY OF SURGERY IF NEEDED WITH A SMALL SIP OF WATER  3.  4.  5.  6.  ____Fleets enema or Magnesium Citrate as directed.   _x___ Use CHG Soap or sage wipes as directed on instruction sheet   ____ Use inhalers on the day of surgery and bring to hospital day of surgery  ____ Stop Metformin and Janumet 2 days prior to surgery.  ____ Take 1/2 of usual insulin dose the night before surgery and none on the morning  surgery.   ____ Follow recommendations from Cardiologist, Pulmonologist or PCP regarding stopping Aspirin, Coumadin, Plavix ,Eliquis, Effient, or Pradaxa, and Pletal.  X____Stop Anti-inflammatories such as Advil, Aleve, Ibuprofen, Motrin, Naproxen, Naprosyn, Goodies powders or aspirin products NOW-OK to take Tylenol    ____ Stop supplements until after surgery.    ____ Bring C-Pap to the hospital.

## 2018-05-28 ENCOUNTER — Encounter
Admission: RE | Admit: 2018-05-28 | Discharge: 2018-05-28 | Disposition: A | Discharge status: Home / Self Care | Payer: 59 | Source: Ambulatory Visit | Attending: Obstetrics & Gynecology | Admitting: Obstetrics & Gynecology

## 2018-05-28 DIAGNOSIS — Z302 Encounter for sterilization: Secondary | ICD-10-CM | POA: Diagnosis not present

## 2018-05-28 DIAGNOSIS — Z87891 Personal history of nicotine dependence: Secondary | ICD-10-CM | POA: Diagnosis not present

## 2018-05-28 DIAGNOSIS — N941 Unspecified dyspareunia: Secondary | ICD-10-CM | POA: Diagnosis not present

## 2018-05-28 DIAGNOSIS — F419 Anxiety disorder, unspecified: Secondary | ICD-10-CM | POA: Diagnosis not present

## 2018-05-28 DIAGNOSIS — Z01818 Encounter for other preprocedural examination: Secondary | ICD-10-CM | POA: Insufficient documentation

## 2018-05-28 DIAGNOSIS — Z79899 Other long term (current) drug therapy: Secondary | ICD-10-CM | POA: Diagnosis not present

## 2018-05-28 DIAGNOSIS — Z793 Long term (current) use of hormonal contraceptives: Secondary | ICD-10-CM | POA: Diagnosis not present

## 2018-05-28 DIAGNOSIS — Z981 Arthrodesis status: Secondary | ICD-10-CM | POA: Diagnosis not present

## 2018-05-28 LAB — CBC
HCT: 39.1 % (ref 36.0–46.0)
Hemoglobin: 12.7 g/dL (ref 12.0–15.0)
MCH: 32.7 pg (ref 26.0–34.0)
MCHC: 32.5 g/dL (ref 30.0–36.0)
MCV: 100.8 fL — ABNORMAL HIGH (ref 80.0–100.0)
Platelets: 406 10*3/uL — ABNORMAL HIGH (ref 150–400)
RBC: 3.88 MIL/uL (ref 3.87–5.11)
RDW: 13.8 % (ref 11.5–15.5)
WBC: 7.6 10*3/uL (ref 4.0–10.5)
nRBC: 0 % (ref 0.0–0.2)

## 2018-05-28 LAB — TYPE AND SCREEN
ABO/RH(D): O POS
Antibody Screen: NEGATIVE

## 2018-05-30 ENCOUNTER — Encounter: Payer: Self-pay | Admitting: *Deleted

## 2018-05-30 ENCOUNTER — Other Ambulatory Visit: Payer: Self-pay

## 2018-05-30 ENCOUNTER — Encounter
Admission: RE | Disposition: A | Discharge status: Home / Self Care | Payer: Self-pay | Source: Home / Self Care | Attending: Obstetrics & Gynecology

## 2018-05-30 ENCOUNTER — Ambulatory Visit: Payer: 59 | Admitting: Anesthesiology

## 2018-05-30 ENCOUNTER — Ambulatory Visit
Admission: RE | Admit: 2018-05-30 | Discharge: 2018-05-30 | Disposition: A | Discharge status: Home / Self Care | Payer: 59 | Attending: Obstetrics & Gynecology | Admitting: Obstetrics & Gynecology

## 2018-05-30 DIAGNOSIS — N941 Unspecified dyspareunia: Secondary | ICD-10-CM

## 2018-05-30 DIAGNOSIS — N9419 Other specified dyspareunia: Secondary | ICD-10-CM | POA: Diagnosis present

## 2018-05-30 DIAGNOSIS — Z302 Encounter for sterilization: Secondary | ICD-10-CM | POA: Insufficient documentation

## 2018-05-30 DIAGNOSIS — Z3009 Encounter for other general counseling and advice on contraception: Secondary | ICD-10-CM

## 2018-05-30 DIAGNOSIS — Z981 Arthrodesis status: Secondary | ICD-10-CM | POA: Insufficient documentation

## 2018-05-30 DIAGNOSIS — K660 Peritoneal adhesions (postprocedural) (postinfection): Secondary | ICD-10-CM

## 2018-05-30 DIAGNOSIS — R102 Pelvic and perineal pain: Secondary | ICD-10-CM

## 2018-05-30 DIAGNOSIS — Z87891 Personal history of nicotine dependence: Secondary | ICD-10-CM | POA: Insufficient documentation

## 2018-05-30 DIAGNOSIS — F419 Anxiety disorder, unspecified: Secondary | ICD-10-CM | POA: Insufficient documentation

## 2018-05-30 DIAGNOSIS — Z79899 Other long term (current) drug therapy: Secondary | ICD-10-CM | POA: Insufficient documentation

## 2018-05-30 DIAGNOSIS — Z793 Long term (current) use of hormonal contraceptives: Secondary | ICD-10-CM | POA: Insufficient documentation

## 2018-05-30 HISTORY — PX: LAPAROSCOPIC BILATERAL SALPINGECTOMY: SHX5889

## 2018-05-30 LAB — POCT PREGNANCY, URINE: Preg Test, Ur: NEGATIVE

## 2018-05-30 LAB — ABO/RH: ABO/RH(D): O POS

## 2018-05-30 SURGERY — SALPINGECTOMY, BILATERAL, LAPAROSCOPIC
Anesthesia: General | Site: Abdomen | Laterality: Bilateral

## 2018-05-30 MED ORDER — BUPIVACAINE HCL (PF) 0.5 % IJ SOLN
INTRAMUSCULAR | Status: DC | PRN
  Administered 2018-05-30: 10 mL

## 2018-05-30 MED ORDER — KETOROLAC TROMETHAMINE 30 MG/ML IJ SOLN
INTRAMUSCULAR | Status: DC | PRN
  Administered 2018-05-30: 30 mg via INTRAVENOUS

## 2018-05-30 MED ORDER — SUCCINYLCHOLINE CHLORIDE 20 MG/ML IJ SOLN
INTRAMUSCULAR | Status: DC | PRN
  Administered 2018-05-30: 100 mg via INTRAVENOUS

## 2018-05-30 MED ORDER — DEXAMETHASONE SODIUM PHOSPHATE 10 MG/ML IJ SOLN
INTRAMUSCULAR | Status: DC | PRN
  Administered 2018-05-30: 4 mg via INTRAVENOUS

## 2018-05-30 MED ORDER — FAMOTIDINE 20 MG PO TABS
20.0000 mg | ORAL_TABLET | Freq: Once | ORAL | Status: AC
  Administered 2018-05-30: 20 mg via ORAL

## 2018-05-30 MED ORDER — PHENYLEPHRINE HCL 10 MG/ML IJ SOLN
INTRAMUSCULAR | Status: DC | PRN
  Administered 2018-05-30: 50 ug via INTRAVENOUS

## 2018-05-30 MED ORDER — MIDAZOLAM HCL 5 MG/5ML IJ SOLN
INTRAMUSCULAR | Status: DC | PRN
  Administered 2018-05-30: 2 mg via INTRAVENOUS

## 2018-05-30 MED ORDER — ACETAMINOPHEN 10 MG/ML IV SOLN
INTRAVENOUS | Status: DC | PRN
  Administered 2018-05-30: 1000 mg via INTRAVENOUS

## 2018-05-30 MED ORDER — LACTATED RINGERS IV SOLN: INTRAVENOUS | Status: DC

## 2018-05-30 MED ORDER — OXYCODONE-ACETAMINOPHEN 5-325 MG PO TABS: 1.0000 | ORAL_TABLET | ORAL | 0 refills | Status: DC | PRN

## 2018-05-30 MED ORDER — SUGAMMADEX SODIUM 200 MG/2ML IV SOLN
INTRAVENOUS | Status: DC | PRN
  Administered 2018-05-30: 160 mg via INTRAVENOUS

## 2018-05-30 MED ORDER — KETOROLAC TROMETHAMINE 30 MG/ML IJ SOLN
30.0000 mg | Freq: Four times a day (QID) | INTRAMUSCULAR | Status: DC
  Filled 2018-05-30: qty 1

## 2018-05-30 MED ORDER — LIDOCAINE HCL (CARDIAC) PF 100 MG/5ML IV SOSY
PREFILLED_SYRINGE | INTRAVENOUS | Status: DC | PRN
  Administered 2018-05-30: 60 mg via INTRAVENOUS

## 2018-05-30 MED ORDER — FENTANYL CITRATE (PF) 100 MCG/2ML IJ SOLN
INTRAMUSCULAR | Status: AC
  Administered 2018-05-30: 25 ug via INTRAVENOUS
  Filled 2018-05-30: qty 2

## 2018-05-30 MED ORDER — ROCURONIUM BROMIDE 100 MG/10ML IV SOLN
INTRAVENOUS | Status: DC | PRN
  Administered 2018-05-30: 5 mg via INTRAVENOUS
  Administered 2018-05-30: 10 mg via INTRAVENOUS

## 2018-05-30 MED ORDER — ACETAMINOPHEN 650 MG RE SUPP
650.0000 mg | RECTAL | Status: DC | PRN
  Filled 2018-05-30: qty 1

## 2018-05-30 MED ORDER — KETOROLAC TROMETHAMINE 30 MG/ML IJ SOLN
INTRAMUSCULAR | Status: AC
  Filled 2018-05-30: qty 1

## 2018-05-30 MED ORDER — BUPIVACAINE HCL (PF) 0.5 % IJ SOLN
INTRAMUSCULAR | Status: AC
  Filled 2018-05-30: qty 30

## 2018-05-30 MED ORDER — ONDANSETRON HCL 4 MG/2ML IJ SOLN
INTRAMUSCULAR | Status: AC
  Filled 2018-05-30: qty 2

## 2018-05-30 MED ORDER — MORPHINE SULFATE (PF) 4 MG/ML IV SOLN: 1.0000 mg | INTRAVENOUS | Status: DC | PRN

## 2018-05-30 MED ORDER — LACTATED RINGERS IV SOLN
INTRAVENOUS | Status: DC
  Administered 2018-05-30 (×2): via INTRAVENOUS

## 2018-05-30 MED ORDER — PROPOFOL 10 MG/ML IV BOLUS
INTRAVENOUS | Status: AC
  Filled 2018-05-30: qty 20

## 2018-05-30 MED ORDER — PROPOFOL 10 MG/ML IV BOLUS
INTRAVENOUS | Status: DC | PRN
  Administered 2018-05-30: 150 mg via INTRAVENOUS

## 2018-05-30 MED ORDER — MEPERIDINE HCL 50 MG/ML IJ SOLN
6.2500 mg | INTRAMUSCULAR | Status: DC | PRN
  Administered 2018-05-30 (×2): 12.5 mg via INTRAVENOUS

## 2018-05-30 MED ORDER — OXYCODONE-ACETAMINOPHEN 5-325 MG PO TABS
ORAL_TABLET | ORAL | Status: AC
  Filled 2018-05-30: qty 2

## 2018-05-30 MED ORDER — ACETAMINOPHEN 10 MG/ML IV SOLN
INTRAVENOUS | Status: AC
  Filled 2018-05-30: qty 100

## 2018-05-30 MED ORDER — MEPERIDINE HCL 50 MG/ML IJ SOLN
INTRAMUSCULAR | Status: AC
  Administered 2018-05-30: 12.5 mg via INTRAVENOUS
  Filled 2018-05-30: qty 1

## 2018-05-30 MED ORDER — DEXAMETHASONE SODIUM PHOSPHATE 4 MG/ML IJ SOLN
INTRAMUSCULAR | Status: AC
  Filled 2018-05-30: qty 1

## 2018-05-30 MED ORDER — FENTANYL CITRATE (PF) 100 MCG/2ML IJ SOLN
INTRAMUSCULAR | Status: DC | PRN
  Administered 2018-05-30: 100 ug via INTRAVENOUS

## 2018-05-30 MED ORDER — ACETAMINOPHEN 325 MG PO TABS: 650.0000 mg | ORAL_TABLET | ORAL | Status: DC | PRN

## 2018-05-30 MED ORDER — OXYCODONE-ACETAMINOPHEN 5-325 MG PO TABS
2.0000 | ORAL_TABLET | Freq: Once | ORAL | Status: AC
  Administered 2018-05-30: 2 via ORAL

## 2018-05-30 MED ORDER — PROMETHAZINE HCL 25 MG/ML IJ SOLN: 6.2500 mg | INTRAMUSCULAR | Status: DC | PRN

## 2018-05-30 MED ORDER — LIDOCAINE HCL (PF) 2 % IJ SOLN
INTRAMUSCULAR | Status: AC
  Filled 2018-05-30: qty 10

## 2018-05-30 MED ORDER — MIDAZOLAM HCL 2 MG/2ML IJ SOLN
INTRAMUSCULAR | Status: AC
  Filled 2018-05-30: qty 2

## 2018-05-30 MED ORDER — FAMOTIDINE 20 MG PO TABS
ORAL_TABLET | ORAL | Status: AC
  Administered 2018-05-30: 20 mg via ORAL
  Filled 2018-05-30: qty 1

## 2018-05-30 MED ORDER — ONDANSETRON HCL 4 MG/2ML IJ SOLN
INTRAMUSCULAR | Status: DC | PRN
  Administered 2018-05-30: 4 mg via INTRAVENOUS

## 2018-05-30 MED ORDER — DEXMEDETOMIDINE HCL 200 MCG/2ML IV SOLN
INTRAVENOUS | Status: DC | PRN
  Administered 2018-05-30: 12 ug via INTRAVENOUS

## 2018-05-30 MED ORDER — FENTANYL CITRATE (PF) 100 MCG/2ML IJ SOLN
INTRAMUSCULAR | Status: AC
  Filled 2018-05-30: qty 2

## 2018-05-30 MED ORDER — FENTANYL CITRATE (PF) 100 MCG/2ML IJ SOLN
25.0000 ug | INTRAMUSCULAR | Status: AC | PRN
  Administered 2018-05-30 (×6): 25 ug via INTRAVENOUS

## 2018-05-30 MED ORDER — OXYCODONE-ACETAMINOPHEN 5-325 MG PO TABS: 1.0000 | ORAL_TABLET | ORAL | Status: DC | PRN

## 2018-05-30 SURGICAL SUPPLY — 40 items
BARRIER ADH SEPRAFILM 3INX5IN (MISCELLANEOUS) ×2 IMPLANT
BLADE SURG SZ11 CARB STEEL (BLADE) ×2 IMPLANT
CANISTER SUCT 1200ML W/VALVE (MISCELLANEOUS) ×2 IMPLANT
CATH ROBINSON RED A/P 16FR (CATHETERS) ×2 IMPLANT
CHLORAPREP W/TINT 26ML (MISCELLANEOUS) ×2 IMPLANT
COVER WAND RF STERILE (DRAPES) IMPLANT
DERMABOND ADVANCED (GAUZE/BANDAGES/DRESSINGS) ×1
DERMABOND ADVANCED .7 DNX12 (GAUZE/BANDAGES/DRESSINGS) ×1 IMPLANT
DRAPE SHEET LG 3/4 BI-LAMINATE (DRAPES) ×2 IMPLANT
DRSG TELFA 4X3 1S NADH ST (GAUZE/BANDAGES/DRESSINGS) ×6 IMPLANT
GLOVE BIO SURGEON STRL SZ8 (GLOVE) ×2 IMPLANT
GLOVE INDICATOR 8.0 STRL GRN (GLOVE) ×2 IMPLANT
GOWN STRL REUS W/ TWL LRG LVL3 (GOWN DISPOSABLE) ×1 IMPLANT
GOWN STRL REUS W/ TWL XL LVL3 (GOWN DISPOSABLE) ×1 IMPLANT
GOWN STRL REUS W/TWL LRG LVL3 (GOWN DISPOSABLE) ×1
GOWN STRL REUS W/TWL XL LVL3 (GOWN DISPOSABLE) ×1
GRASPER SUT TROCAR 14GX15 (MISCELLANEOUS) ×2 IMPLANT
IRRIGATION STRYKERFLOW (MISCELLANEOUS) IMPLANT
IRRIGATOR STRYKERFLOW (MISCELLANEOUS)
IV LACTATED RINGERS 1000ML (IV SOLUTION) IMPLANT
KIT PINK PAD W/HEAD ARE REST (MISCELLANEOUS) ×2
KIT PINK PAD W/HEAD ARM REST (MISCELLANEOUS) ×1 IMPLANT
LABEL OR SOLS (LABEL) ×2 IMPLANT
NEEDLE VERESS 14GA 120MM (NEEDLE) ×2 IMPLANT
NS IRRIG 500ML POUR BTL (IV SOLUTION) ×2 IMPLANT
PACK GYN LAPAROSCOPIC (MISCELLANEOUS) ×2 IMPLANT
PAD PREP 24X41 OB/GYN DISP (PERSONAL CARE ITEMS) ×2 IMPLANT
POUCH SPECIMEN RETRIEVAL 10MM (ENDOMECHANICALS) IMPLANT
SCISSORS METZENBAUM CVD 33 (INSTRUMENTS) ×2 IMPLANT
SET TUBE SMOKE EVAC HIGH FLOW (TUBING) ×2 IMPLANT
SHEARS HARMONIC ACE PLUS 36CM (ENDOMECHANICALS) ×2 IMPLANT
SLEEVE ENDOPATH XCEL 5M (ENDOMECHANICALS) IMPLANT
SPONGE GAUZE 2X2 8PLY STRL LF (GAUZE/BANDAGES/DRESSINGS) IMPLANT
STRAP SAFETY 5IN WIDE (MISCELLANEOUS) ×2 IMPLANT
SUT VIC AB 0 CT1 36 (SUTURE) ×2 IMPLANT
SUT VIC AB 2-0 UR6 27 (SUTURE) IMPLANT
SUT VIC AB 4-0 PS2 18 (SUTURE) IMPLANT
SYR 10ML LL (SYRINGE) ×2 IMPLANT
TROCAR ENDO BLADELESS 11MM (ENDOMECHANICALS) IMPLANT
TROCAR XCEL NON-BLD 5MMX100MML (ENDOMECHANICALS) ×2 IMPLANT

## 2018-05-30 NOTE — Anesthesia Preprocedure Evaluation (Signed)
Anesthesia Evaluation  Patient identified by MRN, date of birth, ID band Patient awake    Reviewed: Allergy & Precautions, NPO status , Patient's Chart, lab work & pertinent test results  History of Anesthesia Complications Negative for: history of anesthetic complications  Airway Mallampati: II  TM Distance: >3 FB Neck ROM: Full    Dental no notable dental hx.    Pulmonary neg sleep apnea, neg COPD, former smoker,    breath sounds clear to auscultation- rhonchi (-) wheezing      Cardiovascular Exercise Tolerance: Good (-) hypertension(-) CAD, (-) Past MI, (-) Cardiac Stents and (-) CABG  Rhythm:Regular Rate:Normal - Systolic murmurs and - Diastolic murmurs    Neuro/Psych  Headaches, PSYCHIATRIC DISORDERS Anxiety Depression    GI/Hepatic negative GI ROS, Neg liver ROS,   Endo/Other  neg diabetesHypothyroidism   Renal/GU Renal disease: hx of nephrolithiasis.     Musculoskeletal  (+) Arthritis ,   Abdominal (+) - obese,   Peds  Hematology  (+) anemia ,   Anesthesia Other Findings Past Medical History: 06/09/2016: Absolute anemia 11/15/2014: Addison anemia No date: Anemia No date: Anxiety No date: Arthritis No date: Chronic kidney disease has had since cervical fusion 2015: Migraine   Reproductive/Obstetrics                             Anesthesia Physical Anesthesia Plan  ASA: II  Anesthesia Plan: General   Post-op Pain Management:    Induction: Intravenous  PONV Risk Score and Plan: 2 and Ondansetron, Dexamethasone and Midazolam  Airway Management Planned: Oral ETT  Additional Equipment:   Intra-op Plan:   Post-operative Plan: Extubation in OR  Informed Consent: I have reviewed the patients History and Physical, chart, labs and discussed the procedure including the risks, benefits and alternatives for the proposed anesthesia with the patient or authorized representative who  has indicated his/her understanding and acceptance.     Dental advisory given  Plan Discussed with: CRNA and Anesthesiologist  Anesthesia Plan Comments:         Anesthesia Quick Evaluation

## 2018-05-30 NOTE — Op Note (Signed)
  Operative Note   05/30/2018  PRE-OP DIAGNOSIS: Desire for permanent sterilization, pelvic pain and dyspareunia  POST-OP DIAGNOSIS: same   PROCEDURE: Procedure(s): OPERATIVE LAPAROSCOPY  LEFT SALPINGECTOMY  LYSIS of ADHESIONS  SURGEON: Annamarie Major, MD, FACOG  ANESTHESIA: Choice   ESTIMATED BLOOD LOSS: Minimal  COMPLICATIONS: None  DISPOSITION: PACU - hemodynamically stable.  CONDITION: stable  FINDINGS: Laparoscopic survey of the abdomen revealed intra-abdominal adhesions especially of colon to right abdominal side wall and surrounding left adnexa.  Also, the left adnexa had increased vasculature and tortuosity of blood vessels, and adhesions to uterus.  Right tube is noted to be surgically absent.  Both ovaries normal in appearance and adherent to pelvic sidewall.  Uterus normal.  PROCEDURE IN DETAIL: The patient was taken to the OR where anesthesia was administed. The patient was positioned in dorsal lithotomy in the Coward stirrups. The patient was then examined under anesthesia with the above noted findings. The patient was prepped and draped in the normal sterile fashion and bladder was drained using a red rubber cathater. Speculum exam normal, and a Hulka tenaculum was placed for manipulation purposes.  Attention was turned to the patient's abdomen where a 5 mm skin incision was made in the umbilical fold, after injection of local anesthesia. The Veress step needle was carefully introduced into the peritoneal cavity with placement confirmed using the hanging drop technique.  Pneumoperitoneum was obtained. The 5 mm port was then placed under direct visualization with the operative laparoscope  The above noted findings.  Trendelenburg.  Then,  5 mm trocars were then placed in the left lower quadrant and suprapubic region under direct visualization with the laparoscope.  Lysis of adhesions was performed to alleviate concerns of the colon to side wall; and to free the left fallopian  tube from the uterus.  Then, the left fallopian tube was identified and followed out to the fimbria.  Tube was excised utilizing the Harmonic scapel to include the fibria.  No injuries or bleeding was noted.  All instruments and ports were then removed from the abdomen after gas was expelled and patient was leveled.   The skin was closed with skin adhesive. The patient tolerated the procedure well. All counts were correct x 2. The patient was transferred to the recovery room awake, alert and breathing independently.  Annamarie Major, MD, [facility], [facility] 05/30/2018  3:42 PM

## 2018-05-30 NOTE — Transfer of Care (Signed)
Immediate Anesthesia Transfer of Care Note  Patient: Theresa Murphy  Procedure(s) Performed: LAPAROSCOPIC BILATERAL SALPINGECTOMY (Bilateral Abdomen)  Patient Location: PACU  Anesthesia Type:General  Level of Consciousness: sedated  Airway & Oxygen Therapy: Patient Spontanous Breathing and Patient connected to face mask oxygen  Post-op Assessment: Report given to RN and Post -op Vital signs reviewed and stable  Post vital signs: Reviewed and stable  Last Vitals:  Vitals Value Taken Time  BP 101/80 05/30/2018  3:48 PM  Temp    Pulse 67 05/30/2018  3:51 PM  Resp 9 05/30/2018  3:51 PM  SpO2 100 % 05/30/2018  3:51 PM  Vitals shown include unvalidated device data.  Last Pain:  Vitals:   05/30/18 1315  TempSrc: Temporal  PainSc: 0-No pain         Complications: No apparent anesthesia complications

## 2018-05-30 NOTE — Discharge Instructions (Addendum)
Laparoscopic Tubal and Lysis of Abdominal Adhesions, Care After This sheet gives you information about how to care for yourself after your procedure. Your health care provider may also give you more specific instructions. If you have problems or questions, contact your health care provider. What can I expect after the procedure? After the procedure, it is common to have some pain around your incisions. Follow these instructions at home: Incision care   Follow instructions from your health care provider about how to take care of your incisions. Make sure you: ? Wash your hands with soap and water before you change your bandage (dressing). If soap and water are not available, use hand sanitizer. ? Change your dressing as told by your health care provider. ? Leave stitches (sutures), skin glue, or adhesive strips in place. These skin closures may need to stay in place for 2 weeks or longer. If adhesive strip edges start to loosen and curl up, you may trim the loose edges. Do not remove adhesive strips completely unless your health care provider tells you to do that.  Check your incision areas every day for signs of infection. Check for: ? Redness, swelling, or pain. ? Fluid or blood. ? Warmth. ? Pus or a bad smell.  Do not take baths, swim, or use a hot tub until your health care provider approves. Ask your health care provider if you may take showers. You may only be allowed to take sponge baths. Activity  Do not lift anything that is heavier than 10 lb (4.5 kg), or the limit that you are told, until your health care provider says that it is safe.  Return to your normal activities as told by your health care provider. Ask your health care provider what activities are safe for you.  Do not drive or use heavy machinery while taking prescription pain medicine. General instructions  Take over-the-counter and prescription medicines only as told by your health care provider.  After your  procedure, eat only a little at a time at first. Start with liquids. As your appetite improves, gradually return to eating solid foods.  If you are taking prescription pain medicine, take actions to prevent or treat constipation. Your health care provider may recommend that you: ? Drink enough fluid to keep your urine pale yellow. ? Eat foods that are high in fiber, such as fresh fruits and vegetables, whole grains, and beans. ? Limit foods that are high in fat and processed sugars, such as fried or sweet foods. ? Take an over-the-counter or prescription medicine for constipation.  Keep all follow-up visits as told by your health care provider. This is important. Contact a health care provider if:  You have a fever or chills.  You have redness, swelling, or pain at the site of your incisions.  You have fluid or blood coming from your incisions.  Your incisions feel warm to the touch.  You have pus or a bad smell coming from your incisions or the dressing.  Your pain gets worse.  You have a cough.  You have nausea and vomiting that does not go away after 3 hours. Get help right away if you have:  Severe pain in your abdomen or your chest.  Shortness of breath.  Nausea or vomiting that is severe or keeps coming back. Summary  After laparoscopic lysis of abdominal adhesions, it is common to have some pain around your incisions.  Check your incision areas every day for signs of infection. Watch for redness, worsening  pain, warmth, and drainage.  Be sure to keep all follow-up visits as told by your health care provider. This information is not intended to replace advice given to you by your health care provider. Make sure you discuss any questions you have with your health care provider. Document Released: 08/11/2014 Document Revised: 05/01/2017 Document Reviewed: 03/14/2017 Elsevier Interactive Patient Education  2019 [employer]  AMBULATORY SURGERY  DISCHARGE  INSTRUCTIONS   1) The drugs that you were given will stay in your system until tomorrow so for the next 24 hours you should not:  A) Drive an automobile B) Make any legal decisions C) Drink any alcoholic beverage   2) You may resume regular meals tomorrow.  Today it is better to start with liquids and gradually work up to solid foods.  You may eat anything you prefer, but it is better to start with liquids, then soup and crackers, and gradually work up to solid foods.   3) Please notify your doctor immediately if you have any unusual bleeding, trouble breathing, redness and pain at the surgery site, drainage, fever, or pain not relieved by medication.    4) Additional Instructions:   Please contact your physician with any problems or Same Day Surgery at [phone], Monday through Friday 6 am to 4 pm, or [place] [facility] number at [phone].

## 2018-05-30 NOTE — Interval H&P Note (Signed)
History and Physical Interval Note:  05/30/2018 2:15 PM  Theresa Murphy  has presented today for surgery, with the diagnosis of STERILIZATION, DYSPAREVNIA  The various methods of treatment have been discussed with the patient and family. After consideration of risks, benefits and other options for treatment, the patient has consented to  Procedure(s): LAPAROSCOPIC BILATERAL SALPINGECTOMY (Bilateral) as a surgical intervention .  The patient's history has been reviewed, patient examined, no change in status, stable for surgery.  I have reviewed the patient's chart and labs.  Questions were answered to the patient's satisfaction.     Letitia Libra

## 2018-05-30 NOTE — Anesthesia Procedure Notes (Signed)
Procedure Name: Intubation Date/Time: 05/30/2018 2:51 PM Performed by: Dionne Bucy, CRNA Pre-anesthesia Checklist: Patient identified, Patient being monitored, Timeout performed, Emergency Drugs available and Suction available Patient Re-evaluated:Patient Re-evaluated prior to induction Oxygen Delivery Method: Circle system utilized Preoxygenation: Pre-oxygenation with 100% oxygen Induction Type: IV induction Ventilation: Mask ventilation without difficulty Laryngoscope Size: Mac and 3 Grade View: Grade I Tube type: Oral Tube size: 7.0 mm Number of attempts: 1 Airway Equipment and Method: Stylet Placement Confirmation: ETT inserted through vocal cords under direct vision,  positive ETCO2 and breath sounds checked- equal and bilateral Secured at: 21 cm Tube secured with: Tape Dental Injury: Teeth and Oropharynx as per pre-operative assessment

## 2018-05-30 NOTE — Anesthesia Post-op Follow-up Note (Signed)
Anesthesia QCDR form completed.        

## 2018-05-31 ENCOUNTER — Other Ambulatory Visit: Payer: Self-pay | Admitting: Maternal Newborn

## 2018-05-31 ENCOUNTER — Telehealth: Payer: Self-pay

## 2018-05-31 ENCOUNTER — Encounter: Payer: Self-pay | Admitting: Obstetrics & Gynecology

## 2018-05-31 DIAGNOSIS — R110 Nausea: Secondary | ICD-10-CM

## 2018-05-31 DIAGNOSIS — Z9889 Other specified postprocedural states: Principal | ICD-10-CM

## 2018-05-31 MED ORDER — ONDANSETRON 4 MG PO TBDP: 4.0000 mg | ORAL_TABLET | Freq: Three times a day (TID) | ORAL | 0 refills | Status: DC | PRN

## 2018-05-31 NOTE — Telephone Encounter (Signed)
Pt calling c/o nausea after having surgery yesterday.  Can something for nausea be called in?  [phone]

## 2018-05-31 NOTE — Progress Notes (Signed)
Rx sent for Zofran

## 2018-05-31 NOTE — Telephone Encounter (Signed)
I sent a prescription for Zofran to her pharmacy

## 2018-05-31 NOTE — Telephone Encounter (Signed)
Pt aware.

## 2018-05-31 NOTE — Telephone Encounter (Signed)
Can you send in something for nausea, since rph not is not in office

## 2018-06-03 LAB — SURGICAL PATHOLOGY

## 2018-06-04 NOTE — Anesthesia Postprocedure Evaluation (Signed)
Anesthesia Post Note  Patient: Theresa Murphy  Procedure(s) Performed: LAPAROSCOPIC BILATERAL SALPINGECTOMY (Bilateral Abdomen)  Patient location during evaluation: PACU Anesthesia Type: General Level of consciousness: awake and alert and oriented Pain management: pain level controlled Vital Signs Assessment: post-procedure vital signs reviewed and stable Respiratory status: spontaneous breathing Cardiovascular status: blood pressure returned to baseline Anesthetic complications: no     Last Vitals:  Vitals:   05/30/18 1703 05/30/18 1725  BP: 117/75 112/62  Pulse: 66 64  Resp: 15 16  Temp: 36.8 C 36.6 C  SpO2: 100% 100%    Last Pain:  Vitals:   05/31/18 0957  TempSrc:   PainSc: 5                  Martika Egler

## 2018-06-11 DIAGNOSIS — G894 Chronic pain syndrome: Secondary | ICD-10-CM | POA: Diagnosis not present

## 2018-06-14 ENCOUNTER — Ambulatory Visit (INDEPENDENT_AMBULATORY_CARE_PROVIDER_SITE_OTHER): Payer: 59 | Admitting: Obstetrics & Gynecology

## 2018-06-14 ENCOUNTER — Encounter: Payer: Self-pay | Admitting: Obstetrics & Gynecology

## 2018-06-14 VITALS — BP 100/60 | Ht 64.0 in | Wt 170.0 lb

## 2018-06-14 DIAGNOSIS — R1032 Left lower quadrant pain: Secondary | ICD-10-CM

## 2018-06-14 DIAGNOSIS — Z3009 Encounter for other general counseling and advice on contraception: Secondary | ICD-10-CM

## 2018-06-14 NOTE — Progress Notes (Signed)
  Postoperative Follow-up Patient presents post op from LAP LEFT SALPINGECTOMY and LYSIS OF ADHESIONS for PAIN and STERILITY and NOTED LEFT SIDED ADHESIONS FROM PRIOR SURGERIES, 2 weeks ago. Path: DIAGNOSIS:  A. FALLOPIAN TUBE, LEFT; LIGATION:  - COMPLETE CROSS-SECTIONS OF BENIGN FALLOPIAN TUBE IDENTIFIED.  - SEROSAL FIBROUS ADHESIONS.  Images   Subjective: Patient reports marked improvement in her preop symptoms. Eating a regular diet without difficulty. The patient is not having any pain.  Activity: normal activities of daily living. Patient reports additional symptom's since surgery of None.  Objective: BP 100/60   Ht 5\' 4"  (1.626 m)   Wt 170 lb (77.1 kg)   BMI 29.18 kg/m  Physical Exam Constitutional:      General: She is not in acute distress.    Appearance: She is well-developed.  Cardiovascular:     Rate and Rhythm: Normal rate.  Pulmonary:     Effort: Pulmonary effort is normal.  Abdominal:     General: There is no distension.     Palpations: Abdomen is soft.     Tenderness: There is no abdominal tenderness.     Comments: Incision Healing Well   Musculoskeletal: Normal range of motion.  Neurological:     Mental Status: She is alert and oriented to person, place, and time.     Cranial Nerves: No cranial nerve deficit.  Skin:    General: Skin is warm and dry.   Assessment: s/p :  LAP LEFT SALPINGECTOMY LOA progressing well  Plan: Patient has done well after surgery with no apparent complications.  I have discussed the post-operative course to date, and the expected progress moving forward.  The patient understands what complications to be concerned about.  I will see the patient in routine follow up, or sooner if needed.    Monitor for period pain and flow (now off of OCPs) Options for future management discussed  Activity plan: No restriction..  Pelvic rest.  Theresa Murphy 06/14/2018, 4:13 PM

## 2018-07-09 DIAGNOSIS — G894 Chronic pain syndrome: Secondary | ICD-10-CM | POA: Diagnosis not present

## 2018-07-09 DIAGNOSIS — M542 Cervicalgia: Secondary | ICD-10-CM | POA: Diagnosis not present

## 2018-07-09 DIAGNOSIS — M5481 Occipital neuralgia: Secondary | ICD-10-CM | POA: Diagnosis not present

## 2018-08-06 DIAGNOSIS — M5481 Occipital neuralgia: Secondary | ICD-10-CM | POA: Diagnosis not present

## 2018-08-06 DIAGNOSIS — G894 Chronic pain syndrome: Secondary | ICD-10-CM | POA: Diagnosis not present

## 2018-08-06 DIAGNOSIS — M542 Cervicalgia: Secondary | ICD-10-CM | POA: Diagnosis not present

## 2018-08-07 DIAGNOSIS — M542 Cervicalgia: Secondary | ICD-10-CM | POA: Diagnosis not present

## 2018-08-07 DIAGNOSIS — M5481 Occipital neuralgia: Secondary | ICD-10-CM | POA: Diagnosis not present

## 2018-08-07 DIAGNOSIS — G894 Chronic pain syndrome: Secondary | ICD-10-CM | POA: Diagnosis not present

## 2018-10-28 ENCOUNTER — Other Ambulatory Visit: Payer: Self-pay | Admitting: Internal Medicine

## 2018-10-28 DIAGNOSIS — F331 Major depressive disorder, recurrent, moderate: Secondary | ICD-10-CM

## 2018-11-15 ENCOUNTER — Other Ambulatory Visit: Payer: Self-pay | Admitting: Internal Medicine

## 2018-11-25 ENCOUNTER — Other Ambulatory Visit: Payer: Self-pay | Admitting: Internal Medicine

## 2018-11-25 DIAGNOSIS — F331 Major depressive disorder, recurrent, moderate: Secondary | ICD-10-CM

## 2019-02-03 ENCOUNTER — Other Ambulatory Visit: Payer: Self-pay | Admitting: Internal Medicine

## 2019-03-24 ENCOUNTER — Encounter: Payer: 59 | Admitting: Internal Medicine

## 2019-03-24 NOTE — Progress Notes (Deleted)
Date:  03/24/2019   Name:  Theresa Murphy   DOB:  May 05, 1975   MRN:  [id number]   Chief Complaint: No chief complaint on file. Theresa Murphy is a 43 y.o. female who presents today for her Complete Annual Exam. She feels {DESC; WELL/FAIRLY WELL/POORLY:18703}. She reports exercising ***. She reports she is sleeping {DESC; WELL/FAIRLY WELL/POORLY:18703}.   Pap - 2019 Mammogram  07/2016 Immunization History  Administered Date(s) Administered  . Pneumococcal Polysaccharide-23 03/04/2014    Migraine  This is a recurrent problem. The problem has been unchanged. The pain quality is similar to prior headaches. She has tried antidepressants and triptans (and topamax - followed by Neurology) for the symptoms.  Depression        This is a chronic problem.The problem is unchanged.  Past treatments include SSRIs - Selective serotonin reuptake inhibitors.  Compliance with treatment is good.   Lab Results  Component Value Date   CREATININE 1.00 03/21/2018   BUN 16 03/21/2018   NA 141 03/21/2018   K 4.6 03/21/2018   CL 107 (H) 03/21/2018   CO2 18 (L) 03/21/2018   Lab Results  Component Value Date   CHOL 212 (H) 03/21/2018   HDL 44 03/21/2018   LDLCALC 136 (H) 03/21/2018   TRIG 160 (H) 03/21/2018   CHOLHDL 4.8 (H) 03/21/2018   Lab Results  Component Value Date   TSH 1.980 03/21/2018   No results found for: HGBA1C   Review of Systems  Psychiatric/Behavioral: Positive for depression.    Patient Active Problem List   Diagnosis Date Noted  . Dyspareunia due to medical condition in female 05/15/2018  . Hyperlipidemia, mixed 03/23/2018  . History of arthrodesis 08/29/2017  . Major depressive disorder with current active episode 08/29/2017  . Pap smear abnormality of cervix with ASCUS favoring benign 08/14/2017  . Drug-induced constipation 07/20/2017  . 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  . Chronic tension-type headache, intractable 05/26/2015  . DDD (degenerative disc disease),  cervical 02/09/2015  . Cervical fusion syndrome 02/09/2015  . Cervical facet syndrome 02/09/2015  . Bilateral occipital neuralgia 02/09/2015  . DDD (degenerative disc disease), 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/2015  . DDD (degenerative disc disease), lumbar 02/09/2015  . Generalized anxiety disorder 01/08/2015  . 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 Acquired hypothyroidism 11/15/2014  . Irritable bowel syndrome with constipation 11/15/2014  . OP (osteoporosis) 11/15/2014  . Sterilization consult 11/15/2014    Allergies  Allergen Reactions  . Codeine Nausea Only  . Cymbalta [Duloxetine Hcl] Nausea Only    Past Surgical History:  Procedure Laterality Date  . APPENDECTOMY    . CERVICAL FUSION  [address]  . ECTOPIC PREGNANCY SURGERY     x 2  . LAPAROSCOPIC BILATERAL SALPINGECTOMY Bilateral 05/30/2018   Procedure: LAPAROSCOPIC BILATERAL SALPINGECTOMY;  Surgeon: Gae Dry, MD;  Location: [facility] ORS;  Service: Gynecology;  Laterality: Bilateral;  . SHOULDER ARTHROSCOPY Right 2014  . TONSILLECTOMY      Social History   Tobacco Use  . Smoking status: Former Smoker    Packs/day: 1.00    Years: 15.00    Pack years: 15.00    Types: Cigarettes    Quit date: 04/10/2004    Years since quitting: 14.9  . Smokeless tobacco: Never Used  Substance Use Topics  . Alcohol use: Yes    Alcohol/week: 2.0 standard drinks    Types: 2 Standard drinks or equivalent per week    Comment: ocassional  . Drug use: No  Medication list has been reviewed and updated.  No outpatient medications have been marked as taking for the 03/24/19 encounter (Appointment) with Reubin Milan, MD.    [facility] 2/9 Scores 05/02/2018 03/21/2018 10/10/2017 08/29/2017  PHQ - 2 Score 0 2 0 4  PHQ- 9 Score 3 12 6 18   Exception Documentation - - - -    BP Readings from Last 3 Encounters:  06/14/18 100/60  05/30/18 112/62  05/24/18 120/80    Physical Exam  Wt Readings from Last 3 Encounters:    06/14/18 170 lb (77.1 kg)  05/30/18 173 lb (78.5 kg)  05/24/18 173 lb (78.5 kg)    There were no vitals taken for this visit.  Assessment and Plan:

## 2019-04-28 ENCOUNTER — Other Ambulatory Visit: Payer: Self-pay | Admitting: Internal Medicine

## 2019-05-05 ENCOUNTER — Ambulatory Visit (INDEPENDENT_AMBULATORY_CARE_PROVIDER_SITE_OTHER): Payer: 59 | Admitting: Internal Medicine

## 2019-05-05 ENCOUNTER — Encounter: Payer: Self-pay | Admitting: Internal Medicine

## 2019-05-05 ENCOUNTER — Other Ambulatory Visit: Payer: Self-pay

## 2019-05-05 VITALS — BP 88/62 | HR 87 | Temp 98.0°F | Ht 64.0 in | Wt 176.0 lb

## 2019-05-05 DIAGNOSIS — R7989 Other specified abnormal findings of blood chemistry: Secondary | ICD-10-CM

## 2019-05-05 DIAGNOSIS — H6522 Chronic serous otitis media, left ear: Secondary | ICD-10-CM

## 2019-05-05 DIAGNOSIS — K5903 Drug induced constipation: Secondary | ICD-10-CM | POA: Diagnosis not present

## 2019-05-05 DIAGNOSIS — F331 Major depressive disorder, recurrent, moderate: Secondary | ICD-10-CM

## 2019-05-05 DIAGNOSIS — E782 Mixed hyperlipidemia: Secondary | ICD-10-CM

## 2019-05-05 DIAGNOSIS — Z0000 Encounter for general adult medical examination without abnormal findings: Secondary | ICD-10-CM | POA: Diagnosis not present

## 2019-05-05 DIAGNOSIS — Z1231 Encounter for screening mammogram for malignant neoplasm of breast: Secondary | ICD-10-CM

## 2019-05-05 LAB — POCT URINALYSIS DIPSTICK
Bilirubin, UA: NEGATIVE
Glucose, UA: NEGATIVE
Ketones, UA: NEGATIVE
Leukocytes, UA: NEGATIVE
Nitrite, UA: NEGATIVE
Protein, UA: NEGATIVE
Spec Grav, UA: >=1.03 — AB (ref 1.010–1.025)
Urobilinogen, UA: 0.2 E.U./dL
pH, UA: 5 (ref 5.0–8.0)

## 2019-05-05 MED ORDER — SERTRALINE HCL 100 MG PO TABS: 100.0000 mg | ORAL_TABLET | Freq: Every day | ORAL | 1 refills | Status: DC

## 2019-05-05 MED ORDER — BUSPIRONE HCL 10 MG PO TABS: 10.0000 mg | ORAL_TABLET | Freq: Two times a day (BID) | ORAL | 1 refills | Status: DC

## 2019-05-05 MED ORDER — PREDNISONE 10 MG PO TABS: 10.0000 mg | ORAL_TABLET | ORAL | 0 refills | Status: AC

## 2019-05-05 MED ORDER — CLONAZEPAM 0.5 MG PO TABS: 0.5000 mg | ORAL_TABLET | Freq: Every day | ORAL | 0 refills | Status: DC | PRN

## 2019-05-05 NOTE — Progress Notes (Signed)
Date:  05/05/2019   Name:  Theresa Murphy   DOB:  Nov 30, 1975   MRN:  [id number]   Chief Complaint: Annual Exam (Breast Exam. No pap- NEXT YEAR. ) and Anxiety (19-GAD7) Theresa Murphy is a 44 y.o. female who presents today for her Complete Annual Exam. She feels well. She reports exercising walking and home exercise. She reports she is sleeping fairly well.   Mammogram  07/2016 Pap 03/2018 neg with cotesting Immunization History  Administered Date(s) Administered  . Pneumococcal Polysaccharide-23 03/04/2014    Migraine  This is a recurrent problem. The problem has been unchanged. The pain quality is similar to prior headaches. Associated symptoms include back pain and ear pain. Pertinent negatives include no abdominal pain, coughing, dizziness, fever, hearing loss, tinnitus or vomiting. She has tried triptans (and topamax) for the symptoms.  Constipation This is a chronic problem. She exercises regularly. There has been adequate water intake. Associated symptoms include back pain. Pertinent negatives include no abdominal pain, diarrhea, fever or vomiting. Treatments tried: doing well on Linzess.  Anxiety Presents for follow-up visit. Symptoms include nervous/anxious behavior. Patient reports no chest pain, dizziness, palpitations or shortness of breath. Symptoms occur most days. The severity of symptoms is moderate.   Compliance with medications: increased zoloft to 100 mg.    Lab Results  Component Value Date   CREATININE 1.00 03/21/2018   BUN 16 03/21/2018   NA 141 03/21/2018   K 4.6 03/21/2018   CL 107 (H) 03/21/2018   CO2 18 (L) 03/21/2018   Lab Results  Component Value Date   CHOL 212 (H) 03/21/2018   HDL 44 03/21/2018   LDLCALC 136 (H) 03/21/2018   TRIG 160 (H) 03/21/2018   CHOLHDL 4.8 (H) 03/21/2018   Lab Results  Component Value Date   TSH 1.980 03/21/2018   No results found for: HGBA1C   Review of Systems  Constitutional: Negative for chills, fatigue  and fever.  HENT: Positive for ear pain. Negative for congestion, hearing loss, tinnitus, trouble swallowing and voice change.   Eyes: Negative for visual disturbance.  Respiratory: Negative for cough, chest tightness, shortness of breath and wheezing.   Cardiovascular: Negative for chest pain, palpitations and leg swelling.  Gastrointestinal: Positive for constipation. Negative for abdominal pain, diarrhea and vomiting.  Endocrine: Negative for polydipsia and polyuria.  Genitourinary: Positive for menstrual problem (stopped ocps last spring after BTL.  had 2 light periods then a heavier one in November and none since). Negative for dysuria, frequency, genital sores, vaginal bleeding and vaginal discharge.  Musculoskeletal: Positive for back pain. Negative for arthralgias, gait problem and joint swelling.  Skin: Negative for color change and rash.  Neurological: Negative for dizziness, tremors, light-headedness and headaches.  Hematological: Negative for adenopathy. Does not bruise/bleed easily.  Psychiatric/Behavioral: Positive for sleep disturbance. Negative for dysphoric mood. The patient is nervous/anxious.     Patient Active Problem List   Diagnosis Date Noted  . Dyspareunia due to medical condition in female 05/15/2018  . Hyperlipidemia, mixed 03/23/2018  . History of arthrodesis 08/29/2017  . Major depressive disorder with current active episode 08/29/2017  . Pap smear abnormality of cervix with ASCUS favoring benign 08/14/2017  . Drug-induced constipation 07/20/2017  . 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  . Chronic tension-type headache, intractable 05/26/2015  . DDD (degenerative disc disease), cervical 02/09/2015  . Cervical fusion syndrome 02/09/2015  . Cervical facet syndrome 02/09/2015  . Bilateral occipital neuralgia 02/09/2015  . DDD (degenerative disc disease), 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/2015  .  DDD (degenerative disc disease), lumbar 02/09/2015  . Generalized anxiety disorder 01/08/2015    . 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 Acquired hypothyroidism 11/15/2014  . Irritable bowel syndrome with constipation 11/15/2014  . OP (osteoporosis) 11/15/2014    Allergies  Allergen Reactions  . Codeine Nausea Only  . Cymbalta [Duloxetine Hcl] Nausea Only    Past Surgical History:  Procedure Laterality Date  . APPENDECTOMY    . CERVICAL FUSION  [address]  . ECTOPIC PREGNANCY SURGERY     x 2  . LAPAROSCOPIC BILATERAL SALPINGECTOMY Bilateral 05/30/2018   Procedure: LAPAROSCOPIC BILATERAL SALPINGECTOMY;  Surgeon: Gae Dry, MD;  Location: [facility] ORS;  Service: Gynecology;  Laterality: Bilateral;  . SHOULDER ARTHROSCOPY Right 2014  . TONSILLECTOMY      Social History   Tobacco Use  . Smoking status: Former Smoker    Packs/day: 1.00    Years: 15.00    Pack years: 15.00    Types: Cigarettes    Quit date: 04/10/2004    Years since quitting: 15.0  . Smokeless tobacco: Never Used  Substance Use Topics  . Alcohol use: Yes    Alcohol/week: 2.0 standard drinks    Types: 2 Standard drinks or equivalent per week    Comment: ocassional  . Drug use: No     Medication list has been reviewed and updated.  Current Meds  Medication Sig  . amitriptyline (ELAVIL) 10 MG tablet Take 20-30 mg by mouth at bedtime as needed.  . AUROVELA FE 1/20 1-20 MG-MCG tablet TAKE 1 TABLET BY MOUTH EVERY DAY( ALTERNATE FOR JUNEL FE 1/20 TABLETS 28S)  . cyanocobalamin (,VITAMIN B-12,) 1000 MCG/ML injection Inject 1 mL (1,000 mcg total) into the muscle every 30 (thirty) days.  . frovatriptan (FROVA) 2.5 MG tablet TAKE 1 TABLET BY MOUTH AS NEEDED FOR MIGRAINE. IF RECURS, MAY REPEAT IN 2 HOURS. MAX 3 IN 24 HOURS.  Marland Kitchen linaclotide (LINZESS) 290 MCG CAPS capsule Take 1 capsule (290 mcg total) by mouth daily before breakfast.  . ondansetron (ZOFRAN-ODT) 4 MG disintegrating tablet Take 1 tablet (4 mg total) by mouth every 8 (eight) hours as needed for nausea or vomiting.  Marland Kitchen  oxyCODONE (ROXICODONE) 15 MG immediate release tablet SMARTSIG:1-2 Tablet(s) By Mouth 2-3 Times Daily  . oxyCODONE-acetaminophen (PERCOCET/ROXICET) 5-325 MG tablet Take 1-2 tablets by mouth every 3 (three) hours as needed for moderate pain.  . rizatriptan (MAXALT-MLT) 10 MG disintegrating tablet Take 10 mg by mouth as needed for migraine.   . sertraline (ZOLOFT) 50 MG tablet TAKE 1 & 1/2 (ONE & ONE-HALF) TABLETS BY MOUTH ONCE DAILY  . tiZANidine (ZANAFLEX) 2 MG tablet Take 2 mg by mouth at bedtime as needed for muscle spasms.   Marland Kitchen topiramate (TOPAMAX) 100 MG tablet Take 100 mg by mouth at bedtime.    PHQ 2/9 Scores 05/05/2019 05/02/2018 03/21/2018 10/10/2017  PHQ - 2 Score 1 0 2 0  PHQ- 9 Score 3 3 12 6   Exception Documentation - - - -   GAD 7 : Generalized Anxiety Score 05/05/2019 08/29/2017  Nervous, Anxious, on Edge 3 3  Control/stop worrying 3 3  Worry too much - different things 3 3  Trouble relaxing 3 3  Restless 3 3  Easily annoyed or irritable 3 3  Afraid - awful might happen 1 3  Total GAD 7 Score 19 21  Anxiety Difficulty Somewhat difficult Extremely difficult      BP Readings from Last 3 Encounters:  05/05/19 Marland Kitchen)  88/62  06/14/18 100/60  05/30/18 112/62    Physical Exam Vitals and nursing note reviewed.  Constitutional:      General: She is not in acute distress.    Appearance: She is well-developed.  HENT:     Head: Normocephalic and atraumatic.     Right Ear: Tympanic membrane and ear canal normal.     Left Ear: Ear canal normal. A middle ear effusion is present.     Nose:     Right Sinus: No maxillary sinus tenderness.     Left Sinus: No maxillary sinus tenderness.  Eyes:     General: No scleral icterus.       Right eye: No discharge.        Left eye: No discharge.     Conjunctiva/sclera: Conjunctivae normal.  Neck:     Thyroid: No thyromegaly.     Vascular: No carotid bruit.  Cardiovascular:     Rate and Rhythm: Normal rate and regular rhythm.     Pulses:  Normal pulses.     Heart sounds: Normal heart sounds.  Pulmonary:     Effort: Pulmonary effort is normal. No respiratory distress.     Breath sounds: No wheezing.  Chest:     Breasts:        Right: No mass, nipple discharge, skin change or tenderness.        Left: No mass, nipple discharge, skin change or tenderness.  Abdominal:     General: Bowel sounds are normal.     Palpations: Abdomen is soft.     Tenderness: There is no abdominal tenderness.  Musculoskeletal:     Cervical back: Normal range of motion. No erythema.     Right lower leg: No edema.     Left lower leg: No edema.  Lymphadenopathy:     Cervical: No cervical adenopathy.  Skin:    General: Skin is warm and dry.     Capillary Refill: Capillary refill takes less than 2 seconds.     Findings: No rash.  Neurological:     General: No focal deficit present.     Mental Status: She is alert and oriented to person, place, and time.     Cranial Nerves: No cranial nerve deficit.     Sensory: No sensory deficit.     Deep Tendon Reflexes: Reflexes are normal and symmetric.  Psychiatric:        Attention and Perception: Attention normal.        Mood and Affect: Mood is anxious.        Speech: Speech normal.        Behavior: Behavior normal.        Thought Content: Thought content normal. Thought content does not include suicidal ideation. Thought content does not include suicidal plan.     Wt Readings from Last 3 Encounters:  05/05/19 176 lb (79.8 kg)  06/14/18 170 lb (77.1 kg)  05/30/18 173 lb (78.5 kg)    BP (!) 88/62   Pulse 87   Temp 98 F (36.7 C) (Oral)   Ht 5\' 4"  (1.626 m)   Wt 176 lb (79.8 kg)   LMP 02/24/2019 (Exact Date)   SpO2 97%   BMI 30.21 kg/m   Assessment and Plan: 1. Annual physical exam Normal exam Continue regular exercise, improve diet Irregular menses likely peri menopausal since BTL and stopping OCPs Pt reassured.   Pt declines flu vaccine - POCT urinalysis dipstick  2. Encounter  for screening mammogram for  breast cancer To be scheduled at [facility] - MM 3D SCREEN BREAST BILATERAL; Future  3. Drug-induced constipation Doing well on Linzess - CBC with Differential/Platelet  4. Moderate episode of recurrent major depressive disorder (HCC) More anxiety since breakup.  She has increased her zoloft to 100 mg.  She has taken clonazepam in the past PRN.  She denies side effects to current regimen.  No SI/HI. Will add buspar bid and clonazepam PRN - TSH - busPIRone (BUSPAR) 10 MG tablet; Take 1 tablet (10 mg total) by mouth 2 (two) times daily.  Dispense: 180 tablet; Refill: 1 - clonazePAM (KLONOPIN) 0.5 MG tablet; Take 1 tablet (0.5 mg total) by mouth daily as needed. for anxiety  Dispense: 15 tablet; Refill: 0 - sertraline (ZOLOFT) 100 MG tablet; Take 1 tablet (100 mg total) by mouth daily.  Dispense: 90 tablet; Refill: 1  5. Hyperlipidemia, mixed Check labs - Lipid panel  6. Elevated liver function tests Recheck from last year - Comprehensive metabolic panel  7. Left chronic serous otitis media - predniSONE (DELTASONE) 10 MG tablet; Take 1 tablet (10 mg total) by mouth as directed for 6 days. Take 6,5,4,3,2,1 then stop  Dispense: 21 tablet; Refill: 0   Partially dictated using Animal nutritionist. Any errors are unintentional.  Bari Edward, MD [facility]  05/05/2019

## 2019-05-06 LAB — CBC WITH DIFFERENTIAL/PLATELET
Basophils Absolute: 0 10*3/uL (ref 0.0–0.2)
Basos: 0 %
EOS (ABSOLUTE): 0.1 10*3/uL (ref 0.0–0.4)
Eos: 1 %
Hematocrit: 42.3 % (ref 34.0–46.6)
Hemoglobin: 14.1 g/dL (ref 11.1–15.9)
Immature Grans (Abs): 0.1 10*3/uL (ref 0.0–0.1)
Immature Granulocytes: 1 %
Lymphocytes Absolute: 1.9 10*3/uL (ref 0.7–3.1)
Lymphs: 16 %
MCH: 31.6 pg (ref 26.6–33.0)
MCHC: 33.3 g/dL (ref 31.5–35.7)
MCV: 95 fL (ref 79–97)
Monocytes Absolute: 0.9 10*3/uL (ref 0.1–0.9)
Monocytes: 7 %
Neutrophils Absolute: 9.4 10*3/uL — ABNORMAL HIGH (ref 1.4–7.0)
Neutrophils: 75 %
Platelets: 434 10*3/uL (ref 150–450)
RBC: 4.46 x10E6/uL (ref 3.77–5.28)
RDW: 12.3 % (ref 11.7–15.4)
WBC: 12.4 10*3/uL — ABNORMAL HIGH (ref 3.4–10.8)

## 2019-05-06 LAB — COMPREHENSIVE METABOLIC PANEL
ALT: 14 IU/L (ref 0–32)
AST: 21 IU/L (ref 0–40)
Albumin/Globulin Ratio: 1.4 (ref 1.2–2.2)
Albumin: 4.3 g/dL (ref 3.8–4.8)
Alkaline Phosphatase: 137 IU/L — ABNORMAL HIGH (ref 39–117)
BUN/Creatinine Ratio: 14 (ref 9–23)
BUN: 13 mg/dL (ref 6–24)
Bilirubin Total: 0.4 mg/dL (ref 0.0–1.2)
CO2: 22 mmol/L (ref 20–29)
Calcium: 9.2 mg/dL (ref 8.7–10.2)
Chloride: 103 mmol/L (ref 96–106)
Creatinine, Ser: 0.95 mg/dL (ref 0.57–1.00)
GFR calc Af Amer: 84 mL/min/{1.73_m2} (ref >59)
GFR calc non Af Amer: 73 mL/min/{1.73_m2} (ref >59)
Globulin, Total: 3.1 g/dL (ref 1.5–4.5)
Glucose: 84 mg/dL (ref 65–99)
Potassium: 4.6 mmol/L (ref 3.5–5.2)
Sodium: 140 mmol/L (ref 134–144)
Total Protein: 7.4 g/dL (ref 6.0–8.5)

## 2019-05-06 LAB — LIPID PANEL
Chol/HDL Ratio: 4.4 ratio (ref 0.0–4.4)
Cholesterol, Total: 221 mg/dL — ABNORMAL HIGH (ref 100–199)
HDL: 50 mg/dL (ref >39)
LDL Chol Calc (NIH): 154 mg/dL — ABNORMAL HIGH (ref 0–99)
Triglycerides: 95 mg/dL (ref 0–149)
VLDL Cholesterol Cal: 17 mg/dL (ref 5–40)

## 2019-05-06 LAB — TSH: TSH: 1.27 u[IU]/mL (ref 0.450–4.500)

## 2019-05-12 ENCOUNTER — Telehealth: Payer: Self-pay

## 2019-05-12 NOTE — Telephone Encounter (Signed)
Tried calling pt but her number in the chart is disconnected?

## 2019-05-12 NOTE — Telephone Encounter (Signed)
Patient called stating she needs a letter typed and signed from you per [facility] stating that you instructed her not to take Clonopin since she is on pain medications begin treated by pain management.  Pkease advise? Wants faxed to their clinic at [phone]

## 2019-05-12 NOTE — Telephone Encounter (Signed)
What I said was that the clonazepam might void her pain contract but that it should help her anxiety symptoms.  She needs to check with Pain management to make sure it is okay with them.

## 2019-05-13 NOTE — Telephone Encounter (Signed)
I spoke with the patient. She said she did pick up the clonopin but never took the medication. She spoke with her pain management Dr who said he will still prescribe her pain medicine if she agrees to throw the medication away without taking any and get a letter from you stating you Advised her not to take the medication due to pain management policy.  If you write and sign and fax this letter he will prescribe her meds again, and send her to psychiatry for treatment.   Fax# [phone]

## 2019-08-06 ENCOUNTER — Other Ambulatory Visit (HOSPITAL_COMMUNITY)
Admission: RE | Admit: 2019-08-06 | Discharge: 2019-08-06 | Disposition: A | Discharge status: Home / Self Care | Payer: 59 | Source: Ambulatory Visit | Attending: Obstetrics & Gynecology | Admitting: Obstetrics & Gynecology

## 2019-08-06 ENCOUNTER — Ambulatory Visit (INDEPENDENT_AMBULATORY_CARE_PROVIDER_SITE_OTHER): Payer: 59 | Admitting: Obstetrics & Gynecology

## 2019-08-06 ENCOUNTER — Other Ambulatory Visit: Payer: Self-pay

## 2019-08-06 ENCOUNTER — Encounter: Payer: Self-pay | Admitting: Obstetrics & Gynecology

## 2019-08-06 VITALS — BP 110/70 | Ht 64.0 in | Wt 174.0 lb

## 2019-08-06 DIAGNOSIS — N920 Excessive and frequent menstruation with regular cycle: Secondary | ICD-10-CM | POA: Diagnosis present

## 2019-08-06 NOTE — Patient Instructions (Signed)
Endometrial Ablation Pre-Procedural Instructions for Patient   You may have a light meal prior to coming to the office if desired.    You can plan on your appointment taking about 1 hour.  The actual procedure lasts for 1/2 hour but you will need to remain in the office for a short period after the procedure.    You may feel drowsy from the medication administered prior to and during the procedure. You should arrange for transportation to and from the office.    The following medications have been prescribed to you.  Please follow the doctor's instructions in taking these medications:  Day before Procedure    Cytotec 200 mg vaginally at bedtime    Ibuprofen 800 mg one by mouth three times a day for 2 days before procedure    Day of Procedure  Phenergan 25 mg  by mouth 1 hour before procedure Demorol 50mg  2 by mouth 1 hour before procedure Valium 5mg   1 by mouth 1 hour before procedure Norco 5/325 mg 1 to 2 by mouth every 4-5 hours as needed for pain Ibuprofen 800 mg 1 by mouth every 8 hours as needed for pain      Endometrial Ablation Endometrial ablation is a procedure that destroys the thin inner layer of the lining of the uterus (endometrium). This procedure may be done:  To stop heavy periods.  To stop bleeding that is causing anemia.  To control irregular bleeding.  To treat bleeding caused by small tumors (fibroids) in the endometrium. This procedure is often an alternative to major surgery, such as removal of the uterus and cervix (hysterectomy). As a result of this procedure:  You may not be able to have children. However, if you are premenopausal (you have not gone through menopause): ? You may still have a small chance of getting pregnant. ? You will need to use a reliable method of birth control after the procedure to prevent pregnancy.  You may stop having a menstrual period, or you may have only a small amount of bleeding during your period. Menstruation  may return several years after the procedure. Tell a health care provider about:  Any allergies you have.  All medicines you are taking, including vitamins, herbs, eye drops, creams, and over-the-counter medicines.  Any problems you or family members have had with the use of anesthetic medicines.  Any blood disorders you have.  Any surgeries you have had.  Any medical conditions you have. What are the risks? Generally, this is a safe procedure. However, problems may occur, including:  A hole (perforation) in the uterus or bowel.  Infection of the uterus, bladder, or vagina.  Bleeding.  Damage to other structures or organs.  An air bubble in the lung (air embolus).  Problems with pregnancy after the procedure.  Failure of the procedure.  Decreased ability to diagnose cancer in the endometrium. What happens before the procedure?  You will have tests of your endometrium to make sure there are no pre-cancerous cells or cancer cells present.  You may have an ultrasound of the uterus.  You may be given medicines to thin the endometrium.  Ask your health care provider about: ? Changing or stopping your regular medicines. This is especially important if you take diabetes medicines or blood thinners. ? Taking medicines such as aspirin and ibuprofen. These medicines can thin your blood. Do not take these medicines before your procedure if your doctor tells you not to.  Plan to have someone take  you home from the hospital or clinic. What happens during the procedure?   You will lie on an exam table with your feet and legs supported as in a pelvic exam.  To lower your risk of infection: ? Your health care team will wash or sanitize their hands and put on germ-free (sterile) gloves. ? Your genital area will be washed with soap.  An IV tube will be inserted into one of your veins.  You will be given a medicine to help you relax (sedative).  A surgical instrument with a  light and camera (resectoscope) will be inserted into your vagina and moved into your uterus. This allows your surgeon to see inside your uterus.  Endometrial tissue will be removed using one of the following methods: ? Radiofrequency. This method uses a radiofrequency-alternating electric current to remove the endometrium. ? Cryotherapy. This method uses extreme cold to freeze the endometrium. ? Heated-free liquid. This method uses a heated saltwater (saline) solution to remove the endometrium. ? Microwave. This method uses high-energy microwaves to heat up the endometrium and remove it. ? Thermal balloon. This method involves inserting a catheter with a balloon tip into the uterus. The balloon tip is filled with heated fluid to remove the endometrium. The procedure may vary among health care providers and hospitals. What happens after the procedure?  Your blood pressure, heart rate, breathing rate, and blood oxygen level will be monitored until the medicines you were given have worn off.  As tissue healing occurs, you may notice vaginal bleeding for 4-6 weeks after the procedure. You may also experience: ? Cramps. ? Thin, watery vaginal discharge that is light pink or brown in color. ? A need to urinate more frequently than usual. ? Nausea.  Do not drive for 24 hours if you were given a sedative.  Do not have sex or insert anything into your vagina until your health care provider approves. Summary  Endometrial ablation is done to treat the many causes of heavy menstrual bleeding.  The procedure may be done only after medications have been tried to control the bleeding.  Plan to have someone take you home from the hospital or clinic. This information is not intended to replace advice given to you by your health care provider. Make sure you discuss any questions you have with your health care provider. Document Revised: 09/11/2017 Document Reviewed: 04/13/2016 Elsevier Patient  Education  [place].

## 2019-08-06 NOTE — Progress Notes (Signed)
HPI:      Theresa Murphy is a 44 y.o. [phone] who LMP was Patient's last menstrual period was 07/20/2019., presents today for a problem visit.  She complains of menorrhagia that  began about a year ago, after she had her BTL done and thus stopped OCPs, and its severity is described as severe.  She has periods monthly sometimes every 3 weeks, and they last 10-14 days w most days heavy;  and they are associated with moderate menstrual cramping.  She has used the following for attempts at control: tampon and pad.  Previous evaluation: none. Prior Diagnosis: none. Previous Treatment: hormones in past have helped; she does not want these.  She is has sex with males.  Hx of STDs: none. She is premenopausal.  PMHx: She  has a past medical history of Absolute anemia (06/09/2016), Addison anemia (11/15/2014), Anemia, Anxiety, Arthritis, Chronic kidney disease, Migraine (has had since cervical fusion 2015), and Sterilization consult (11/15/2014). Also,  has a past surgical history that includes Cervical fusion (2015); Shoulder arthroscopy (Right, 2014); Ectopic pregnancy surgery; Appendectomy; Tonsillectomy; and Laparoscopic bilateral salpingectomy (Bilateral, 05/30/2018)., family history includes CAD in her father; Diabetes in her father; Thyroid disease in her mother.,  reports that she quit smoking about 15 years ago. Her smoking use included cigarettes. She has a 15.00 pack-year smoking history. She has never used smokeless tobacco. She reports current alcohol use of about 2.0 standard drinks of alcohol per week. She reports that she does not use drugs.  She  Current Outpatient Medications:  .  amitriptyline (ELAVIL) 10 MG tablet, Take 20-30 mg by mouth at bedtime as needed., Disp: , Rfl:  .  busPIRone (BUSPAR) 10 MG tablet, Take 1 tablet (10 mg total) by mouth 2 (two) times daily., Disp: 180 tablet, Rfl: 1 .  clonazePAM (KLONOPIN) 0.5 MG tablet, Take 1 tablet (0.5 mg total) by mouth daily as needed. for  anxiety, Disp: 15 tablet, Rfl: 0 .  cyanocobalamin (,VITAMIN B-12,) 1000 MCG/ML injection, Inject 1 mL (1,000 mcg total) into the muscle every 30 (thirty) days., Disp: 1 mL, Rfl: 12 .  frovatriptan (FROVA) 2.5 MG tablet, TAKE 1 TABLET BY MOUTH AS NEEDED FOR MIGRAINE. IF RECURS, MAY REPEAT IN 2 HOURS. MAX 3 IN 24 HOURS., Disp: 10 tablet, Rfl: 0 .  linaclotide (LINZESS) 290 MCG CAPS capsule, Take 1 capsule (290 mcg total) by mouth daily before breakfast., Disp: 30 capsule, Rfl: 5 .  ondansetron (ZOFRAN-ODT) 4 MG disintegrating tablet, Take 1 tablet (4 mg total) by mouth every 8 (eight) hours as needed for nausea or vomiting., Disp: 20 tablet, Rfl: 0 .  oxyCODONE (ROXICODONE) 15 MG immediate release tablet, SMARTSIG:1-2 Tablet(s) By Mouth 2-3 Times Daily, Disp: , Rfl:  .  oxyCODONE-acetaminophen (PERCOCET/ROXICET) 5-325 MG tablet, Take 1-2 tablets by mouth every 3 (three) hours as needed for moderate pain., Disp: 30 tablet, Rfl: 0 .  rizatriptan (MAXALT-MLT) 10 MG disintegrating tablet, Take 10 mg by mouth as needed for migraine. , Disp: , Rfl:  .  sertraline (ZOLOFT) 100 MG tablet, Take 1 tablet (100 mg total) by mouth daily., Disp: 90 tablet, Rfl: 1 .  tiZANidine (ZANAFLEX) 2 MG tablet, Take 2 mg by mouth at bedtime as needed for muscle spasms. , Disp: , Rfl:  .  topiramate (TOPAMAX) 100 MG tablet, Take 100 mg by mouth at bedtime., Disp: , Rfl:   Also, is allergic to codeine and cymbalta [duloxetine hcl].  Review of Systems  Constitutional: Negative for chills, fever  and malaise/fatigue.  HENT: Negative for congestion, sinus pain and sore throat.   Eyes: Negative for blurred vision and pain.  Respiratory: Negative for cough and wheezing.   Cardiovascular: Negative for chest pain and leg swelling.  Gastrointestinal: Negative for abdominal pain, constipation, diarrhea, heartburn, nausea and vomiting.  Genitourinary: Negative for dysuria, frequency, hematuria and urgency.  Musculoskeletal: Positive  for joint pain. Negative for back pain, myalgias and neck pain.  Skin: Negative for itching and rash.  Neurological: Positive for headaches. Negative for dizziness, tremors and weakness.  Endo/Heme/Allergies: Does not bruise/bleed easily.  Psychiatric/Behavioral: Negative for depression. The patient is nervous/anxious. The patient does not have insomnia.     Objective: BP 110/70   Ht 5\' 4"  (1.626 m)   Wt 174 lb (78.9 kg)   LMP 07/20/2019   BMI 29.87 kg/m  Physical Exam Constitutional:      General: She is not in acute distress.    Appearance: She is well-developed.  Genitourinary:     Pelvic exam was performed with patient supine.     Vagina and uterus normal.     No vaginal erythema or bleeding.     No cervical motion tenderness, discharge, polyp or nabothian cyst.     Uterus is mobile.     Uterus is not enlarged.     No uterine mass detected.    Uterus is midaxial.     No right or left adnexal mass present.     Right adnexa not tender.     Left adnexa not tender.  HENT:     Head: Normocephalic and atraumatic.     Nose: Nose normal.  Abdominal:     General: There is no distension.     Palpations: Abdomen is soft.     Tenderness: There is no abdominal tenderness.  Musculoskeletal:        General: Normal range of motion.  Neurological:     Mental Status: She is alert and oriented to person, place, and time.     Cranial Nerves: No cranial nerve deficit.  Skin:    General: Skin is warm and dry.  Psychiatric:        Attention and Perception: Attention normal.        Mood and Affect: Mood and affect normal.        Speech: Speech normal.        Behavior: Behavior normal.        Thought Content: Thought content normal.        Judgment: Judgment normal.     ASSESSMENT/PLAN:  menorrhagia  Problem List Items Addressed This Visit    Menorrhagia with regular cycle      Relevant Orders   Surgical pathology from [facility] Patient has abnormal uterine bleeding . She has a  normal exam today, with no evidence of lesions.  Evaluation includes the following: exam, labs such as hormonal testing, and pelvic ultrasound to evaluate for any structural gynecologic abnormalities.  Patient to follow up after testing.  Treatment option for menorrhagia or menometrorrhagia discussed in great detail with the patient.  Options include hormonal therapy, IUD therapy such as Mirena, D&C, Ablation, and Hysterectomy.  The pros and cons of each option discussed with patient  Desires ablation  Patient was told that it is normal to have menstrual bleeding after an endometrial ablation, only about 80% of patients become amenorrheic, 10% of patients have normal or light periods, and 10% of patients have no change in their bleeding pattern and may  need further intervention.  She was told she will observe her periods for a few months after her ablation to see what her periods will be like; it is recommended to wait until at least three months after the procedure before making conclusions about how periods are going to be like after an ablation.  Medications discussed for before and after the procedure. Rx given.      Barnett Applebaum, MD, [facility], [facility] 08/06/2019  3:02 PM

## 2019-08-07 ENCOUNTER — Telehealth: Payer: Self-pay | Admitting: Obstetrics & Gynecology

## 2019-08-07 NOTE — Telephone Encounter (Signed)
-----   Message from Nadara Mustard, MD sent at 08/06/2019  3:05 PM EDT ----- Regarding: SURGERY IN OFFICE Surgery Booking Request Patient Full Name:  Theresa Murphy  MRN: [id number]  DOB: 01/03/1976  Surgeon: Letitia Libra, MD  Requested Surgery Date and Time: May 17-19 Primary Diagnosis AND Code: Menorrhagia N92.0 Secondary Diagnosis and Code:  Surgical Procedure: IN OFFICE Hysteroscopy D&C with Ablation L&D Notification: No Admission Status: IN OFFICE Length of Surgery: 30 min Special Case Needs: Yes - Alert Binh H&P: No Phone Interview???:  Yes Interpreter: No Language:  Medical Clearance:  No Special Scheduling Instructions: NOne Any known health/anesthesia issues, diabetes, sleep apnea, latex allergy, defibrillator/pacemaker?: No Acuity: P3   (P1 highest, P2 delay may cause harm, P3 low, elective gyn, P4 lowest)

## 2019-08-07 NOTE — Telephone Encounter (Signed)
Called pt to offer dates for IN OFFICE procedure with Dr Tiburcio Pea  Hysteroscopy [facility] w Ablation DOS 5/17 @ 2:30pm  Pt has a driver.  Emailed/texted [place] to let him know  Conf pt has [facility]

## 2019-08-08 LAB — SURGICAL PATHOLOGY

## 2019-08-25 ENCOUNTER — Encounter: Payer: Self-pay | Admitting: Obstetrics & Gynecology

## 2019-08-25 ENCOUNTER — Other Ambulatory Visit: Payer: Self-pay

## 2019-08-25 ENCOUNTER — Ambulatory Visit (INDEPENDENT_AMBULATORY_CARE_PROVIDER_SITE_OTHER): Payer: 59 | Admitting: Obstetrics & Gynecology

## 2019-08-25 VITALS — BP 120/80 | Ht 64.0 in | Wt 170.0 lb

## 2019-08-25 DIAGNOSIS — N920 Excessive and frequent menstruation with regular cycle: Secondary | ICD-10-CM

## 2019-08-25 NOTE — Progress Notes (Signed)
Hysteroscopy with Minerva Endometrial Ablation  Operative Report   Place of Surgery: [facility] Pre-Operative Diagnosis: Menorrhagia Procedure: Hysteroscopy with Minerva Endometrial Ablation  Surgeon: Annamarie Major, MD  Anesthesia: Paracervical Block   Operative Technique:  The patient was prepped and draped in the usual manner for Hysteroscopy with Minerva. Patient had voided prior to coming to the OR. A short weighted speculum was placed into the vagina and the anterior lip of the cervix was grasped with a single tooth tenaculum. Deep intra-cervical block was placed with a mixture of Marcaine 0.5% (6 cc's) and Lidocaine 1% (6 cc's) by placing 3 cc's into each quadrant with a spinal needle. The endocervical canal was measured to 3 centimeters. The uterus was then sounded to 7.5 centimeters. The cervix was dilated to 8 mm with Hegar dilators. Hysteroscopy was then carried out using saline as the distention media.  Hysteroscopy revealed:  A normal endometrial cavity without perforation or myometrial damage. Both tubal ostia were visualized.   It was decided to proceed with the Minerva ablation. The device was set for the appropriate cavity length and was placed into the uterus and deployed. The width was noted on the device to be in the approved Green Zone. The cervical balloon was inflated and the cervical seal was confirmed by the Minerva Multiplex Controller. The foot pedal was then pressed and the 2-part Uterine Integrity Test (UIT) confirmed the integrity of the cavity. Upon completion of the UIT, the treatment cycle lasted 120 seconds. The device was unlocked and removed.   Repeat hysteroscopy revealed that all visual areas of active endometrium had been adequately treated. At this point the procedure is complete and the patient was awakened and in stable condition.   She will be allowed to return to her home, provided that she is doing well. She will be instructed to call to report any increased  bleeding, uncontrolled pain, fever, SOB, or any other significant change in her condition. She is given a postoperative instruction sheet and a follow up appointment for 1 weeks.   Annamarie Major, MD, [facility], [facility] 08/25/2019  3:15 PM

## 2019-08-25 NOTE — Patient Instructions (Signed)
    Endometrial Ablation Post-Procedural Instructions for Patient   You may experience mild to moderate cramping (like menstrual cramping) and pinkish watery discharge.  This may last approximately 2 to 3 weeks. Use pads, not tampons during this time.  No sexual activity for 3 weeks post procedure.  Follow up medications and directions for taking the medications are    NORCO 5/500 1 or 2 by mouth every 4 to 6 hours as needed IBUPROFEN 800 mg by mouth three times a day for the next two days PHENERGAN 25 mg by mouth q 6 hours for nausea       Call our office if you develop any of the following: ? Fever of 100.4 or greater  ? Worsening pelvic pain  ? Nausea  ? Vomiting  ? Greenish vaginal discharge with odor   Office phone number: [phone]   

## 2019-09-01 ENCOUNTER — Encounter: Payer: Self-pay | Admitting: Obstetrics & Gynecology

## 2019-09-01 ENCOUNTER — Telehealth (INDEPENDENT_AMBULATORY_CARE_PROVIDER_SITE_OTHER): Payer: 59 | Admitting: Obstetrics & Gynecology

## 2019-09-01 DIAGNOSIS — N920 Excessive and frequent menstruation with regular cycle: Secondary | ICD-10-CM

## 2019-09-01 NOTE — Progress Notes (Signed)
Virtual Visit via Telephone Note I connected with Theresa Murphy on 09/01/19 at 11:00 AM EDT by telephone and verified that I am speaking with the correct person using two identifiers.   I discussed the limitations, risks, security and privacy concerns of performing an evaluation and management service by telephone and the availability of in person appointments. I also discussed with the patient that there may be a patient responsible charge related to this service. The patient expressed understanding and agreed to proceed. She was at home and I was in my office.  History of Present Illness: Patient presents post op from endometrial ablation for abnormal uterine bleeding, 1 week ago.  Subjective: Patient reports some improvement in her preop symptoms. Eating a regular diet without difficulty. The patient is not having any pain.  Activity: normal activities of daily living. Patient reports additional symptom's since surgery of discharge still present, mild.   Observations/Objective: No exam today, due to telephone eVisit due to [facility] virus restriction on elective visits and procedures.  Prior visits reviewed along with ultrasounds/labs as indicated.  Assessment and Plan:   ICD-10-CM   1. Menorrhagia with regular cycle  N92.0   s/p :  endometrial ablation progressing well  Plan: Patient has done well after surgery with no apparent complications.  I have discussed the post-operative course to date, and the expected progress moving forward.  The patient understands what complications to be concerned about.  I will see the patient in routine follow up, or sooner if needed.    Activity plan: No restriction. Monitor future cycles  Follow Up Instructions: Yearly/PRN   I discussed the assessment and treatment plan with the patient. The patient was provided an opportunity to ask questions and all were answered. The patient agreed with the plan and demonstrated an understanding of the  instructions.   The patient was advised to call back or seek an in-person evaluation if the symptoms worsen or if the condition fails to improve as anticipated.  I provided 10 minutes of non-face-to-face time during this encounter.   Letitia Libra, MD

## 2019-11-11 ENCOUNTER — Ambulatory Visit: Payer: Self-pay | Admitting: *Deleted

## 2019-11-11 NOTE — Telephone Encounter (Signed)
  Pt had her 1st COVID-19 vaccine with the Pfizer on 10/27/2019.   She has had a headache since severe  Like a migraine. There are no openings with Dr. Judithann Graves.   I'm send a note to the office.   Someone will call her back regarding an appt. Reason for Disposition . COVID-19 vaccine, Frequently Asked Questions (FAQs) . [1] MILD-MODERATE headache AND [2] present > 72 hours    Headache since 1st COVID shot on 10/27/2019  Answer Assessment - Initial Assessment Questions 1. MAIN CONCERN OR SYMPTOM:  "What is your main concern right now?" "What question do you have?" "What's the main symptom you're worried about?" (e.g., fever, pain, redness, swelling)     I've had a headache since receiving the Pfizer vaccine 10/27/2019.   2. VACCINE: "What vaccination did you receive?" "Is this your first or second shot?" (e.g., none; AstraZeneca, J&J, Moderna, [employer], other)     Pfizer 3. SYMPTOM ONSET: "When did the headaches begin?" (e.g., not relevant; hours, days)      10/27/2019 4. SYMPTOM SEVERITY: "How bad is it?"      Constant headache 5. FEVER: "Is there a fever?" If so, ask: "What is it, how was it measured, and when did it start?"      No 6. PAST REACTIONS: "Have you reacted to immunizations before?" If so, ask: "What happened?"     No 7. OTHER SYMPTOMS: "Do you have any other symptoms?"     No  Protocols used: CORONAVIRUS (COVID-19) VACCINE QUESTIONS AND REACTIONS-A-AH, HEADACHE-A-AH

## 2019-11-12 NOTE — Telephone Encounter (Signed)
Spoke to pt, she will contact her neurologist

## 2020-03-04 ENCOUNTER — Other Ambulatory Visit: Payer: Self-pay | Admitting: Internal Medicine

## 2020-05-07 ENCOUNTER — Encounter: Payer: 59 | Admitting: Internal Medicine

## 2020-06-08 ENCOUNTER — Encounter: Payer: 59 | Admitting: Internal Medicine

## 2020-09-09 ENCOUNTER — Ambulatory Visit (INDEPENDENT_AMBULATORY_CARE_PROVIDER_SITE_OTHER): Payer: 59 | Admitting: Internal Medicine

## 2020-09-09 ENCOUNTER — Other Ambulatory Visit: Payer: Self-pay

## 2020-09-09 ENCOUNTER — Encounter: Payer: Self-pay | Admitting: Internal Medicine

## 2020-09-09 VITALS — BP 104/68 | HR 76 | Temp 98.5°F | Ht 64.0 in | Wt 159.0 lb

## 2020-09-09 DIAGNOSIS — Z1231 Encounter for screening mammogram for malignant neoplasm of breast: Secondary | ICD-10-CM

## 2020-09-09 DIAGNOSIS — E782 Mixed hyperlipidemia: Secondary | ICD-10-CM

## 2020-09-09 DIAGNOSIS — Z0000 Encounter for general adult medical examination without abnormal findings: Secondary | ICD-10-CM | POA: Diagnosis not present

## 2020-09-09 DIAGNOSIS — F325 Major depressive disorder, single episode, in full remission: Secondary | ICD-10-CM

## 2020-09-09 DIAGNOSIS — G43109 Migraine with aura, not intractable, without status migrainosus: Secondary | ICD-10-CM

## 2020-09-09 DIAGNOSIS — D518 Other vitamin B12 deficiency anemias: Secondary | ICD-10-CM

## 2020-09-09 MED ORDER — CYANOCOBALAMIN 1000 MCG/ML IJ SOLN
1000.0000 ug | INTRAMUSCULAR | 12 refills | Status: AC
Start: 2020-09-09 — End: ?

## 2020-09-09 NOTE — Progress Notes (Signed)
Date:  09/09/2020   Name:  Theresa Murphy   DOB:  1975-05-08   MRN:  [id number]   Chief Complaint: Annual Exam (Breast exam no pap)  [name] is a 45 y.o. female who presents today for her Complete Annual Exam. She feels well. She reports exercising walking 7 days a week, swimming 2 times a week. She reports she is sleeping well. Breast complaints none. Still having a light period but not as severe since ablation in 2020.  Mammogram: 06/2016 [facility] Pap smear: 03/2018 neg with cotesting Colonoscopy: none  Immunization History  Administered Date(s) Administered  . PFIZER Comirnaty(Gray Top)Covid-19 Tri-Sucrose Vaccine 10/27/2019, 11/17/2019  . Pneumococcal Polysaccharide-23 03/04/2014    Migraine  This is a recurrent (managed by Neurology) problem. The problem occurs intermittently. The problem has been unchanged. Associated symptoms include back pain and neck pain. Pertinent negatives include no abdominal pain, coughing, dizziness, fever, hearing loss, insomnia, tinnitus or vomiting.  Depression        This is a chronic problem.  The problem has been resolved since onset.  Associated symptoms include myalgias and headaches.  Associated symptoms include no fatigue, no helplessness, no hopelessness, does not have insomnia, no decreased interest and no suicidal ideas.  Past treatments include SSRIs - Selective serotonin reuptake inhibitors and other medications (weaned off of all medication last year after boyfriend moved out).   Lab Results  Component Value Date   CREATININE 0.95 05/05/2019   BUN 13 05/05/2019   NA 140 05/05/2019   K 4.6 05/05/2019   CL 103 05/05/2019   CO2 22 05/05/2019   Lab Results  Component Value Date   CHOL 221 (H) 05/05/2019   HDL 50 05/05/2019   LDLCALC 154 (H) 05/05/2019   TRIG 95 05/05/2019   CHOLHDL 4.4 05/05/2019   Lab Results  Component Value Date   TSH 1.270 05/05/2019   No results found for: HGBA1C Lab Results  Component Value Date    WBC 12.4 (H) 05/05/2019   HGB 14.1 05/05/2019   HCT 42.3 05/05/2019   MCV 95 05/05/2019   PLT 434 05/05/2019   Lab Results  Component Value Date   ALT 14 05/05/2019   AST 21 05/05/2019   ALKPHOS 137 (H) 05/05/2019   BILITOT 0.4 05/05/2019     Review of Systems  Constitutional: Negative for chills, fatigue and fever.  HENT: Negative for congestion, hearing loss, tinnitus, trouble swallowing and voice change.   Eyes: Negative for visual disturbance.  Respiratory: Negative for cough, chest tightness, shortness of breath and wheezing.   Cardiovascular: Negative for chest pain, palpitations and leg swelling.  Gastrointestinal: Negative for abdominal pain, constipation, diarrhea and vomiting.  Endocrine: Negative for polydipsia and polyuria.  Genitourinary: Negative for dysuria, frequency, genital sores, vaginal bleeding and vaginal discharge.  Musculoskeletal: Positive for back pain, myalgias and neck pain. Negative for arthralgias, gait problem and joint swelling.  Skin: Negative for color change and rash.       Mole that scabs over  Neurological: Positive for headaches. Negative for dizziness, tremors and light-headedness.  Hematological: Negative for adenopathy. Does not bruise/bleed easily.  Psychiatric/Behavioral: Positive for depression. Negative for dysphoric mood, sleep disturbance and suicidal ideas. The patient is not nervous/anxious and does not have insomnia.     Patient Active Problem List   Diagnosis Date Noted  . Dyspareunia due to medical condition in female 05/15/2018  . Hyperlipidemia, mixed 03/23/2018  . History of arthrodesis 08/29/2017  . Major depression single episode,  in partial remission (HCC) 08/29/2017  . Pap smear abnormality of cervix with ASCUS favoring benign 08/14/2017  . Drug-induced constipation 07/20/2017  . 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  . Chronic tension-type headache, intractable 05/26/2015  . DDD (degenerative disc disease), cervical  02/09/2015  . Cervical fusion syndrome 02/09/2015  . Cervical facet syndrome 02/09/2015  . Bilateral occipital neuralgia 02/09/2015  . DDD (degenerative disc disease), 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/2015  . DDD (degenerative disc disease), lumbar 02/09/2015  . Generalized anxiety disorder 01/08/2015  . 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 Acquired hypothyroidism 11/15/2014  . Irritable bowel syndrome with constipation 11/15/2014  . OP (osteoporosis) 11/15/2014    Allergies  Allergen Reactions  . Codeine Nausea Only  . Cymbalta [Duloxetine Hcl] Nausea Only    Past Surgical History:  Procedure Laterality Date  . APPENDECTOMY    . CERVICAL FUSION  [address]  . ECTOPIC PREGNANCY SURGERY     x 2  . LAPAROSCOPIC BILATERAL SALPINGECTOMY Bilateral 05/30/2018   Procedure: LAPAROSCOPIC BILATERAL SALPINGECTOMY;  Surgeon: Nadara Mustard, MD;  Location: [facility] ORS;  Service: Gynecology;  Laterality: Bilateral;  . SHOULDER ARTHROSCOPY Right 2014  . TONSILLECTOMY      Social History   Tobacco Use  . Smoking status: Former Smoker    Packs/day: 1.00    Years: 15.00    Pack years: 15.00    Types: Cigarettes    Quit date: 04/10/2004    Years since quitting: 16.4  . Smokeless tobacco: Never Used  Vaping Use  . Vaping Use: Never used  Substance Use Topics  . Alcohol use: Yes    Alcohol/week: 2.0 standard drinks    Types: 2 Standard drinks or equivalent per week    Comment: ocassional  . Drug use: No     Medication list has been reviewed and updated.  Current Meds  Medication Sig  . amitriptyline (ELAVIL) 10 MG tablet Take 20-30 mg by mouth at bedtime as needed.  . naloxone (NARCAN) nasal spray 4 mg/0.1 mL SMARTSIG:Both Nares  . oxyCODONE (ROXICODONE) 15 MG immediate release tablet SMARTSIG:1-2 Tablet(s) By Mouth 2-3 Times Daily  . rizatriptan (MAXALT-MLT) 10 MG disintegrating tablet Take 10 mg by mouth as needed for migraine.   Marland Kitchen tiZANidine (ZANAFLEX) 2 MG tablet Take  2 mg by mouth at bedtime as needed for muscle spasms.   Marland Kitchen topiramate (TOPAMAX) 100 MG tablet Take 100 mg by mouth at bedtime.  . topiramate (TOPAMAX) 50 MG tablet Take 50 mg by mouth daily.  . [DISCONTINUED] busPIRone (BUSPAR) 10 MG tablet Take 1 tablet (10 mg total) by mouth 2 (two) times daily.  . [DISCONTINUED] LORazepam (ATIVAN) 1 MG tablet take 1-2 tablets by oral route 30 min prior to MRI  . [DISCONTINUED] sertraline (ZOLOFT) 100 MG tablet Take 1 tablet (100 mg total) by mouth daily.    PHQ 2/9 Scores 09/09/2020 05/05/2019 05/02/2018 03/21/2018  PHQ - 2 Score 0 1 0 2  PHQ- 9 Score 0 3 3 12   Exception Documentation - - - -    GAD 7 : Generalized Anxiety Score 09/09/2020 05/05/2019 08/29/2017  Nervous, Anxious, on Edge 0 3 3  Control/stop worrying 0 3 3  Worry too much - different things 2 3 3   Trouble relaxing 0 3 3  Restless 0 3 3  Easily annoyed or irritable 0 3 3  Afraid - awful might happen 0 1 3  Total GAD 7 Score 2 19 21   Anxiety Difficulty - Somewhat difficult Extremely  difficult    BP Readings from Last 3 Encounters:  09/09/20 104/68  08/25/19 120/80  08/06/19 110/70    Physical Exam Vitals and nursing note reviewed.  Constitutional:      General: She is not in acute distress.    Appearance: She is well-developed.  HENT:     Head: Normocephalic and atraumatic.     Right Ear: Tympanic membrane and ear canal normal.     Left Ear: Tympanic membrane and ear canal normal.     Nose:     Right Sinus: No maxillary sinus tenderness.     Left Sinus: No maxillary sinus tenderness.  Eyes:     General: No scleral icterus.       Right eye: No discharge.        Left eye: No discharge.     Conjunctiva/sclera: Conjunctivae normal.  Neck:     Thyroid: No thyromegaly.     Vascular: No carotid bruit.  Cardiovascular:     Rate and Rhythm: Normal rate and regular rhythm.     Pulses: Normal pulses.     Heart sounds: Normal heart sounds.  Pulmonary:     Effort: Pulmonary effort  is normal. No respiratory distress.     Breath sounds: No wheezing.  Chest:  Breasts:     Right: No mass, nipple discharge, skin change or tenderness.     Left: No mass, nipple discharge, skin change or tenderness.    Abdominal:     General: Bowel sounds are normal.     Palpations: Abdomen is soft.     Tenderness: There is no abdominal tenderness.  Musculoskeletal:     Cervical back: Normal range of motion. No erythema.     Right lower leg: No edema.     Left lower leg: No edema.  Lymphadenopathy:     Cervical: No cervical adenopathy.  Skin:    General: Skin is warm and dry.     Findings: No rash.  Neurological:     Mental Status: She is alert and oriented to person, place, and time.     Cranial Nerves: No cranial nerve deficit.     Sensory: No sensory deficit.     Deep Tendon Reflexes: Reflexes are normal and symmetric.  Psychiatric:        Attention and Perception: Attention normal.        Mood and Affect: Mood normal.     Wt Readings from Last 3 Encounters:  09/09/20 159 lb (72.1 kg)  08/25/19 170 lb (77.1 kg)  08/06/19 174 lb (78.9 kg)    BP 104/68   Pulse 76   Temp 98.5 F (36.9 C) (Oral)   Ht 5\' 4"  (1.626 m)   Wt 159 lb (72.1 kg)   LMP 09/08/2020   SpO2 97%   BMI 27.29 kg/m   Assessment and Plan: 1. Annual physical exam Normal exam; continue exercise, healthy diet Pap up to date - due 2024 - CBC with Differential/Platelet - Comprehensive metabolic panel  2. Encounter for screening mammogram for breast cancer To be scheduled at [facility] - MM 3D SCREEN BREAST BILATERAL; Future  3. Major depression, single episode, in complete remission [facility]) Doing very well since relationship change.  No longer taking medication. Continue to monitor sx and follow up if needed - TSH  4. Hyperlipidemia, mixed Hx of mild elevation in lipids - will recheck and advise if medication is needed - Lipid panel  5. Migraine aura without headache Clinically stable without  change in character or frequency of migraine headaches Headaches respond well to current therapy with Elavil and Topamax. Will continue current plan, follow up with Neurology if worsening.  6. Other vitamin B12 deficiency anemia She has been out of medication for one year. Recheck levels to verify ongoing need for IM supplementatioin - cyanocobalamin (,VITAMIN B-12,) 1000 MCG/ML injection; Inject 1 mL (1,000 mcg total) into the muscle every 30 (thirty) days.  Dispense: 1 mL; Refill: 12 - Vitamin B12   Partially dictated using Animal nutritionistDragon software. Any errors are unintentional.  Bari EdwardLaura Leimomi Zervas, MD [facility]  09/09/2020

## 2020-09-09 NOTE — Patient Instructions (Signed)
Seborrheic Keratosis A seborrheic keratosis is a common, noncancerous (benign) skin growth. These growths are velvety, waxy, rough, tan, brown, or black spots that appear on the skin. These skin growths can be flat or raised, and scaly. What are the causes? The cause of this condition is not known. What increases the risk? You are more likely to develop this condition if you:  Have a family history of seborrheic keratosis.  Are 50 or older.  Are pregnant.  Have had estrogen replacement therapy. What are the signs or symptoms? Symptoms of this condition include growths on the face, chest, shoulders, back, or other areas. These growths:  Are usually painless, but may become irritated and itchy.  Can be yellow, brown, black, or other colors.  Are slightly raised or have a flat surface.  Are sometimes rough or wart-like in texture.  Are often velvety or waxy on the surface.  Are round or oval-shaped.  Often occur in groups, but may occur as a single growth.   How is this diagnosed? This condition is diagnosed with a medical history and physical exam.  A sample of the growth may be tested (skin biopsy).  You may need to see a skin specialist (dermatologist). How is this treated? Treatment is not usually needed for this condition, unless the growths are irritated or bleed often.  You may also choose to have the growths removed if you do not like their appearance. ? Most commonly, these growths are treated with a procedure in which liquid nitrogen is applied to "freeze" off the growth (cryosurgery). ? They may also be burned off with electricity (electrocautery) or removed by scraping (curettage). Follow these instructions at home:  Watch your growth for any changes.  Keep all follow-up visits as told by your health care provider. This is important.  Do not scratch or pick at the growth or growths. This can cause them to become irritated or infected. Contact a health care  provider if:  You suddenly have many new growths.  Your growth bleeds, itches, or hurts.  Your growth suddenly becomes larger or changes color. Summary  A seborrheic keratosis is a common, noncancerous (benign) skin growth.  Treatment is not usually needed for this condition, unless the growths are irritated or bleed often.  Watch your growth for any changes.  Contact a health care provider if you suddenly have many new growths or your growth suddenly becomes larger or changes color.  Keep all follow-up visits as told by your health care provider. This is important. This information is not intended to replace advice given to you by your health care provider. Make sure you discuss any questions you have with your health care provider. Document Revised: 08/09/2017 Document Reviewed: 08/09/2017 Elsevier Patient Education  2021 Elsevier Inc.  

## 2020-09-10 LAB — COMPREHENSIVE METABOLIC PANEL
ALT: 10 IU/L (ref 0–32)
AST: 14 IU/L (ref 0–40)
Albumin/Globulin Ratio: 1.5 (ref 1.2–2.2)
Albumin: 3.9 g/dL (ref 3.8–4.8)
Alkaline Phosphatase: 98 IU/L (ref 44–121)
BUN/Creatinine Ratio: 16 (ref 9–23)
BUN: 14 mg/dL (ref 6–24)
Bilirubin Total: 0.5 mg/dL (ref 0.0–1.2)
CO2: 22 mmol/L (ref 20–29)
Calcium: 9.2 mg/dL (ref 8.7–10.2)
Chloride: 105 mmol/L (ref 96–106)
Creatinine, Ser: 0.88 mg/dL (ref 0.57–1.00)
Globulin, Total: 2.6 g/dL (ref 1.5–4.5)
Glucose: 91 mg/dL (ref 65–99)
Potassium: 5.1 mmol/L (ref 3.5–5.2)
Sodium: 140 mmol/L (ref 134–144)
Total Protein: 6.5 g/dL (ref 6.0–8.5)
eGFR: 83 mL/min/{1.73_m2} (ref >59)

## 2020-09-10 LAB — CBC WITH DIFFERENTIAL/PLATELET
Basophils Absolute: 0 10*3/uL (ref 0.0–0.2)
Basos: 1 %
EOS (ABSOLUTE): 0.2 10*3/uL (ref 0.0–0.4)
Eos: 2 %
Hematocrit: 43.9 % (ref 34.0–46.6)
Hemoglobin: 14.7 g/dL (ref 11.1–15.9)
Immature Grans (Abs): 0 10*3/uL (ref 0.0–0.1)
Immature Granulocytes: 1 %
Lymphocytes Absolute: 1.8 10*3/uL (ref 0.7–3.1)
Lymphs: 28 %
MCH: 32.5 pg (ref 26.6–33.0)
MCHC: 33.5 g/dL (ref 31.5–35.7)
MCV: 97 fL (ref 79–97)
Monocytes Absolute: 0.6 10*3/uL (ref 0.1–0.9)
Monocytes: 10 %
Neutrophils Absolute: 3.9 10*3/uL (ref 1.4–7.0)
Neutrophils: 58 %
Platelets: 267 10*3/uL (ref 150–450)
RBC: 4.52 x10E6/uL (ref 3.77–5.28)
RDW: 12.1 % (ref 11.7–15.4)
WBC: 6.6 10*3/uL (ref 3.4–10.8)

## 2020-09-10 LAB — LIPID PANEL
Chol/HDL Ratio: 5.2 ratio — ABNORMAL HIGH (ref 0.0–4.4)
Cholesterol, Total: 198 mg/dL (ref 100–199)
HDL: 38 mg/dL — ABNORMAL LOW (ref >39)
LDL Chol Calc (NIH): 142 mg/dL — ABNORMAL HIGH (ref 0–99)
Triglycerides: 98 mg/dL (ref 0–149)
VLDL Cholesterol Cal: 18 mg/dL (ref 5–40)

## 2020-09-10 LAB — TSH: TSH: 1.61 u[IU]/mL (ref 0.450–4.500)

## 2020-09-10 LAB — VITAMIN B12: Vitamin B-12: 330 pg/mL (ref 232–1245)

## 2020-09-20 ENCOUNTER — Ambulatory Visit
Admission: RE | Admit: 2020-09-20 | Discharge: 2020-09-20 | Disposition: A | Discharge status: Home / Self Care | Payer: 59 | Source: Ambulatory Visit | Attending: Internal Medicine | Admitting: Internal Medicine

## 2020-09-20 ENCOUNTER — Other Ambulatory Visit: Payer: Self-pay

## 2020-09-20 DIAGNOSIS — Z1231 Encounter for screening mammogram for malignant neoplasm of breast: Secondary | ICD-10-CM | POA: Diagnosis not present

## 2021-09-03 ENCOUNTER — Encounter: Payer: Self-pay | Admitting: Internal Medicine

## 2021-09-04 ENCOUNTER — Emergency Department
Admission: EM | Admit: 2021-09-04 | Discharge: 2021-09-04 | Disposition: A | Discharge status: Home / Self Care | Payer: Commercial Managed Care - PPO | Attending: Emergency Medicine | Admitting: Emergency Medicine

## 2021-09-04 ENCOUNTER — Encounter: Payer: Self-pay | Admitting: Intensive Care

## 2021-09-04 ENCOUNTER — Emergency Department: Payer: Commercial Managed Care - PPO

## 2021-09-04 ENCOUNTER — Other Ambulatory Visit: Payer: Self-pay

## 2021-09-04 DIAGNOSIS — R112 Nausea with vomiting, unspecified: Secondary | ICD-10-CM | POA: Insufficient documentation

## 2021-09-04 DIAGNOSIS — R079 Chest pain, unspecified: Secondary | ICD-10-CM | POA: Insufficient documentation

## 2021-09-04 LAB — BASIC METABOLIC PANEL
Anion gap: 11 (ref 5–15)
BUN: 18 mg/dL (ref 6–20)
CO2: 23 mmol/L (ref 22–32)
Calcium: 9.4 mg/dL (ref 8.9–10.3)
Chloride: 108 mmol/L (ref 98–111)
Creatinine, Ser: 1.06 mg/dL — ABNORMAL HIGH (ref 0.44–1.00)
GFR, Estimated: >60 mL/min (ref >60)
Glucose, Bld: 147 mg/dL — ABNORMAL HIGH (ref 70–99)
Potassium: 3.7 mmol/L (ref 3.5–5.1)
Sodium: 142 mmol/L (ref 135–145)

## 2021-09-04 LAB — LIPASE, BLOOD: Lipase: 29 U/L (ref 11–51)

## 2021-09-04 LAB — CBC
HCT: 48.3 % — ABNORMAL HIGH (ref 36.0–46.0)
Hemoglobin: 16.4 g/dL — ABNORMAL HIGH (ref 12.0–15.0)
MCH: 32.7 pg (ref 26.0–34.0)
MCHC: 34 g/dL (ref 30.0–36.0)
MCV: 96.2 fL (ref 80.0–100.0)
Platelets: 332 10*3/uL (ref 150–400)
RBC: 5.02 MIL/uL (ref 3.87–5.11)
RDW: 12.6 % (ref 11.5–15.5)
WBC: 13 10*3/uL — ABNORMAL HIGH (ref 4.0–10.5)
nRBC: 0 % (ref 0.0–0.2)

## 2021-09-04 LAB — TROPONIN I (HIGH SENSITIVITY): Troponin I (High Sensitivity): 3 ng/L (ref <18)

## 2021-09-04 MED ORDER — ONDANSETRON HCL 4 MG/2ML IJ SOLN
INTRAMUSCULAR | Status: AC
  Administered 2021-09-04: 4 mg via INTRAVENOUS
  Filled 2021-09-04: qty 2

## 2021-09-04 MED ORDER — TIZANIDINE HCL 2 MG PO TABS
2.0000 mg | ORAL_TABLET | Freq: Once | ORAL | Status: AC
  Administered 2021-09-04: 2 mg via ORAL
  Filled 2021-09-04: qty 1

## 2021-09-04 MED ORDER — SODIUM CHLORIDE 0.9 % IV BOLUS
1000.0000 mL | Freq: Once | INTRAVENOUS | Status: AC
  Administered 2021-09-04: 1000 mL via INTRAVENOUS

## 2021-09-04 MED ORDER — ONDANSETRON 8 MG PO TBDP: 8.0000 mg | ORAL_TABLET | Freq: Three times a day (TID) | ORAL | 0 refills | Status: DC | PRN

## 2021-09-04 MED ORDER — AMITRIPTYLINE HCL 10 MG PO TABS
10.0000 mg | ORAL_TABLET | Freq: Once | ORAL | Status: AC
  Administered 2021-09-04: 10 mg via ORAL
  Filled 2021-09-04: qty 1

## 2021-09-04 MED ORDER — PROMETHAZINE HCL 12.5 MG PO TABS: 12.5000 mg | ORAL_TABLET | Freq: Four times a day (QID) | ORAL | 0 refills | Status: DC | PRN

## 2021-09-04 MED ORDER — ONDANSETRON HCL 4 MG/2ML IJ SOLN: 4.0000 mg | Freq: Once | INTRAMUSCULAR | Status: AC

## 2021-09-04 MED ORDER — OXYCODONE HCL 5 MG PO TABS
15.0000 mg | ORAL_TABLET | Freq: Once | ORAL | Status: AC
  Administered 2021-09-04: 15 mg via ORAL
  Filled 2021-09-04: qty 3

## 2021-09-04 MED ORDER — DROPERIDOL 2.5 MG/ML IJ SOLN
2.5000 mg | Freq: Once | INTRAMUSCULAR | Status: AC
  Administered 2021-09-04: 2.5 mg via INTRAVENOUS
  Filled 2021-09-04: qty 2

## 2021-09-04 NOTE — ED Triage Notes (Addendum)
Patient c/o stabbing/cramping chest pain that's started yesterday with N/V. Patient takes chronic medication for neck and back pain

## 2021-09-04 NOTE — ED Provider Notes (Signed)
I be  [facility] Provider Note   Event Date/Time   First MD Initiated Contact with Patient 09/04/21 0801     (approximate) History  Chest Pain  HPI Theresa Murphy is a 46 y.o. female with a stated past medical history of bilateral occipital neuralgia, degenerative disc disease, chronic migraines, generalized anxiety disorder, IBS, chronic pain syndrome on chronic narcotic regimen and associated drug-induced constipation who presents for nausea/vomiting over the last 48 hours with associated withdrawal symptoms from not being able to keep down her 50 mg Percocets.  Patient also complains of sharp substernal central chest pain that began when the vomiting did and is described as 9/10, nonradiating, and worse with retching.  Patient currently denies any vision changes, tinnitus, difficulty speaking, facial droop, sore throat, shortness of breath, abdominal pain, diarrhea, dysuria, or weakness/numbness/paresthesias in any extremity Physical Exam  Triage Vital Signs: ED Triage Vitals  Enc Vitals Group     BP 09/04/21 0756 118/78     Pulse Rate 09/04/21 0756 (!) 57     Resp 09/04/21 0756 18     Temp 09/04/21 0756 97.7 F (36.5 C)     Temp Source 09/04/21 0756 Oral     SpO2 09/04/21 0756 98 %     Weight 09/04/21 0753 150 lb (68 kg)     Height 09/04/21 0753 5\' 4"  (1.626 m)     Head Circumference --      Peak Flow --      Pain Score 09/04/21 0752 9     Pain Loc --      Pain Edu? --      Excl. in [place]? --    Most recent vital signs: Vitals:   09/04/21 0930 09/04/21 1030  BP: (!) 112/57 130/71  Pulse: (!) 54 (!) 40  Resp: 20 20  Temp:    SpO2: 98% 98%   General: Awake, oriented x4. CV:  Good peripheral perfusion.  Resp:  Normal effort.  Abd:  No distention.  Other:  Middle-aged Caucasian overweight female laying in bed in mild distress secondary to nausea ED Results / Procedures / Treatments  Labs (all labs ordered are listed, but only abnormal results are  displayed) Labs Reviewed  BASIC METABOLIC PANEL - Abnormal; Notable for the following components:      Result Value   Glucose, Bld 147 (*)    Creatinine, Ser 1.06 (*)    All other components within normal limits  CBC - Abnormal; Notable for the following components:   WBC 13.0 (*)    Hemoglobin 16.4 (*)    HCT 48.3 (*)    All other components within normal limits  LIPASE, BLOOD  TROPONIN I (HIGH SENSITIVITY)   EKG ED ECG REPORT I, Naaman Plummer, the attending physician, personally viewed and interpreted this ECG. Date: 09/04/2021 EKG Time: 0801 Rate: 66 Rhythm: normal sinus rhythm QRS Axis: normal Intervals: normal ST/T Wave abnormalities: normal Narrative Interpretation: no evidence of acute ischemia RADIOLOGY ED MD interpretation: 2 view chest x-ray interpreted by me shows no evidence of acute abnormalities including no pneumonia, pneumothorax, or widened mediastinum -Agree with radiology assessment Official radiology report(s): DG Chest 2 View  Result Date: 09/04/2021 CLINICAL DATA:  Chest pain EXAM: CHEST - 2 VIEW COMPARISON:  None Available. FINDINGS: Normal heart size and mediastinal contours. No acute infiltrate or edema. No effusion or pneumothorax. No acute osseous findings. IMPRESSION: Negative chest. Electronically Signed   By: Jorje Guild M.D.   On: 09/04/2021  08:17   PROCEDURES: Critical Care performed: No .1-3 Lead EKG Interpretation Performed by: Naaman Plummer, MD Authorized by: Naaman Plummer, MD     Interpretation: normal     ECG rate:  65   ECG rate assessment: normal     Rhythm: sinus rhythm     Ectopy: none     Conduction: normal   MEDICATIONS ORDERED IN ED: Medications  sodium chloride 0.9 % bolus 1,000 mL (0 mLs Intravenous Stopped 09/04/21 1112)  ondansetron (ZOFRAN) injection 4 mg (4 mg Intravenous Given 09/04/21 0854)  oxyCODONE (Oxy IR/ROXICODONE) immediate release tablet 15 mg (15 mg Oral Given 09/04/21 1005)  amitriptyline (ELAVIL)  tablet 10 mg (10 mg Oral Given 09/04/21 1005)  tiZANidine (ZANAFLEX) tablet 2 mg (2 mg Oral Given 09/04/21 1005)  droperidol (INAPSINE) 2.5 MG/ML injection 2.5 mg (2.5 mg Intravenous Given 09/04/21 0951)   IMPRESSION / MDM / [place] / ED COURSE  I reviewed the triage vital signs and the nursing notes.                             The patient is on the cardiac monitor to evaluate for evidence of arrhythmia and/or significant heart rate changes. Patient presents for acute nausea/vomiting The cause of the patients symptoms is not clear, but the patient is overall well appearing and is suspected to have a transient course of illness.  Given History and Exam there does not appear to be an emergent cause of the symptoms such as small bowel obstruction, coronary syndrome, bowel ischemia, DKA, pancreatitis, appendicitis, other acute abdomen or other emergent problem.  Reassessment: After treatment, the patient is feeling much better, tolerating PO fluids, and shows no signs of dehydration.   Disposition: Discharge home with prompt primary care physician follow up in the next 48 hours. Strict return precautions discussed.    FINAL CLINICAL IMPRESSION(S) / ED DIAGNOSES   Final diagnoses:  Chest pain, unspecified type  Nausea and vomiting, unspecified vomiting type   Rx / DC Orders   ED Discharge Orders          Ordered    promethazine (PHENERGAN) 12.5 MG tablet  Every 6 hours PRN        09/04/21 1102    ondansetron (ZOFRAN-ODT) 8 MG disintegrating tablet  Every 8 hours PRN        09/04/21 1102           Note:  This document was prepared using Dragon voice recognition software and may include unintentional dictation errors.   Naaman Plummer, MD 09/04/21 [phone]

## 2021-09-04 NOTE — ED Notes (Signed)
Pt states she is no longer nauseous and able to take her pain medications at this time. Will continue to monitor for any nausea or vomiting.   Family at bedside.

## 2021-09-04 NOTE — ED Notes (Signed)
Ppw provided to pt.pt denies any questions at this time. Followup and rx information provided. Pt ambulatory off unit on foot alert and oriented. PT provides verbal consent for dc at this time and declines vs

## 2021-09-04 NOTE — ED Notes (Signed)
Per Dr. Vicente Males, no second troponin at this time. Pt also reports that she has vomited twice since the Zofran was giving. States that she thinks that its her pain causing her nausea and she would like her pain medication. Explained that we needed to get her vomiting under control. Dr. Vicente Males made aware.

## 2021-09-04 NOTE — ED Notes (Addendum)
Pt presents to the ED for chest pain, nausea, and diarrhea since yesterday. Pt is on chronic pain medications for neck and back pain. Pt states she has been vomiting since yesterday and has not been able to keep anything down. Pt had 1 episode of vomiting at this time. Pt is A&OX4 and NAD

## 2021-09-04 NOTE — ED Triage Notes (Signed)
%  T temp, HR,  in via EMS from home with c/o CP since last om under sternum that is stabbing in nature. Pt also reports some vomiting. 113/64, 99%, 98.6 temp

## 2021-09-04 NOTE — ED Notes (Signed)
Pt given ginger ale at this time with approval from Dr. Cheri Fowler, instructed pt to take small sips, pt verbalized understanding at this time.

## 2021-09-04 NOTE — ED Notes (Signed)
Patient transported to X-ray 

## 2021-09-16 ENCOUNTER — Encounter: Payer: Self-pay | Admitting: Internal Medicine

## 2021-09-16 ENCOUNTER — Ambulatory Visit
Admission: RE | Admit: 2021-09-16 | Discharge: 2021-09-16 | Disposition: A | Discharge status: Home / Self Care | Payer: Commercial Managed Care - PPO | Attending: Internal Medicine | Admitting: Internal Medicine

## 2021-09-16 ENCOUNTER — Ambulatory Visit
Admission: RE | Admit: 2021-09-16 | Discharge: 2021-09-16 | Disposition: A | Discharge status: Home / Self Care | Payer: Commercial Managed Care - PPO | Source: Ambulatory Visit | Attending: Internal Medicine | Admitting: Internal Medicine

## 2021-09-16 ENCOUNTER — Ambulatory Visit (INDEPENDENT_AMBULATORY_CARE_PROVIDER_SITE_OTHER): Payer: Commercial Managed Care - PPO | Admitting: Internal Medicine

## 2021-09-16 VITALS — BP 100/60 | HR 91 | Ht 64.0 in | Wt 161.0 lb

## 2021-09-16 DIAGNOSIS — M25562 Pain in left knee: Secondary | ICD-10-CM | POA: Diagnosis present

## 2021-09-16 DIAGNOSIS — E039 Hypothyroidism, unspecified: Secondary | ICD-10-CM

## 2021-09-16 DIAGNOSIS — Z1231 Encounter for screening mammogram for malignant neoplasm of breast: Secondary | ICD-10-CM | POA: Diagnosis not present

## 2021-09-16 DIAGNOSIS — E782 Mixed hyperlipidemia: Secondary | ICD-10-CM

## 2021-09-16 DIAGNOSIS — Z0000 Encounter for general adult medical examination without abnormal findings: Secondary | ICD-10-CM

## 2021-09-16 DIAGNOSIS — Z1211 Encounter for screening for malignant neoplasm of colon: Secondary | ICD-10-CM | POA: Diagnosis not present

## 2021-09-16 MED ORDER — MELOXICAM 15 MG PO TABS: 15.0000 mg | ORAL_TABLET | Freq: Every day | ORAL | 0 refills | Status: DC

## 2021-09-16 NOTE — Progress Notes (Signed)
Date:  09/16/2021   Name:  Theresa Murphy   DOB:  20-Jun-1975   MRN:  [id number]   Chief Complaint: Annual Exam (Breast exam no pap ) Theresa Murphy is a 46 y.o. female who presents today for her Complete Annual Exam. She feels well. She reports exercising walking daily. She reports she is sleeping well. Breast complaints none.  No menses due to ablation.  Mammogram: 09/2020 DEXA: none Pap smear: 03/2018 neg with co-testing Colonoscopy: none  Health Maintenance Due  Topic Date Due   TETANUS/TDAP  Never done   Pneumococcal Vaccine 35-66 Years old (2 - PCV) 03/05/2015   COLONOSCOPY (Pts 45-59yr Insurance coverage will need to be confirmed)  Never done    Immunization History  Administered Date(s) Administered   PFIZER Comirnaty(Gray Top)Covid-19 Tri-Sucrose Vaccine 10/27/2019, 11/17/2019   Pneumococcal Polysaccharide-23 03/04/2014    Knee Pain  There was no injury mechanism. The pain is present in the left knee. The quality of the pain is described as shooting and aching. The pain is moderate. The pain has been Fluctuating since onset. Associated symptoms include an inability to bear weight (esp rising from a squat).    Lab Results  Component Value Date   NA 142 09/04/2021   K 3.7 09/04/2021   CO2 23 09/04/2021   GLUCOSE 147 (H) 09/04/2021   BUN 18 09/04/2021   CREATININE 1.06 (H) 09/04/2021   CALCIUM 9.4 09/04/2021   EGFR 83 09/09/2020   GFRNONAA >60 09/04/2021   Lab Results  Component Value Date   CHOL 198 09/09/2020   HDL 38 (L) 09/09/2020   LDLCALC 142 (H) 09/09/2020   TRIG 98 09/09/2020   CHOLHDL 5.2 (H) 09/09/2020   Lab Results  Component Value Date   TSH 1.610 09/09/2020   No results found for: "HGBA1C" Lab Results  Component Value Date   WBC 13.0 (H) 09/04/2021   HGB 16.4 (H) 09/04/2021   HCT 48.3 (H) 09/04/2021   MCV 96.2 09/04/2021   PLT 332 09/04/2021   Lab Results  Component Value Date   ALT 10 09/09/2020   AST 14 09/09/2020   ALKPHOS  98 09/09/2020   BILITOT 0.5 09/09/2020   No results found for: "25OHVITD2", "25OHVITD3", "VD25OH"   Review of Systems  Constitutional:  Negative for chills, fatigue and fever.  HENT:  Negative for congestion, hearing loss, tinnitus, trouble swallowing and voice change.   Eyes:  Negative for visual disturbance.  Respiratory:  Negative for cough, chest tightness, shortness of breath and wheezing.   Cardiovascular:  Negative for chest pain, palpitations and leg swelling.  Gastrointestinal:  Negative for abdominal pain, constipation, diarrhea and vomiting.  Endocrine: Negative for polydipsia and polyuria.  Genitourinary:  Negative for dysuria, frequency, genital sores, vaginal bleeding and vaginal discharge.  Musculoskeletal:  Positive for arthralgias. Negative for gait problem and joint swelling.  Skin:  Negative for color change and rash.  Neurological:  Negative for dizziness, tremors, light-headedness and headaches.  Hematological:  Negative for adenopathy. Does not bruise/bleed easily.  Psychiatric/Behavioral:  Negative for dysphoric mood and sleep disturbance. The patient is not nervous/anxious.     Patient Active Problem List   Diagnosis Date Noted   Dyspareunia due to medical condition in female 05/15/2018   Hyperlipidemia, mixed 03/23/2018   History of arthrodesis 08/29/2017   Pap smear abnormality of cervix with ASCUS favoring benign 08/14/2017   Drug-induced constipation 07/20/2017   Absolute anemia 06/09/2016   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   Chronic tension-type  headache, intractable 05/26/2015   DDD (degenerative disc disease), cervical 02/09/2015   Cervical fusion syndrome 02/09/2015   Cervical facet syndrome 02/09/2015   Bilateral occipital neuralgia 02/09/2015   DDD (degenerative disc disease), 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/2015   DDD (degenerative disc disease), lumbar 02/09/2015   Generalized anxiety disorder 01/08/2015   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   Acquired  hypothyroidism 11/15/2014   Irritable bowel syndrome with constipation 11/15/2014   OP (osteoporosis) 11/15/2014    Allergies  Allergen Reactions   Codeine Nausea Only   Cymbalta [Duloxetine Hcl] Nausea Only    Past Surgical History:  Procedure Laterality Date   APPENDECTOMY     CERVICAL FUSION  2015   [place]     x 2   LAPAROSCOPIC BILATERAL SALPINGECTOMY Bilateral 05/30/2018   Procedure: LAPAROSCOPIC BILATERAL SALPINGECTOMY;  Surgeon: Gae Dry, MD;  Location: [facility] ORS;  Service: Gynecology;  Laterality: Bilateral;   SHOULDER ARTHROSCOPY Right 2014   TONSILLECTOMY      Social History   Tobacco Use   Smoking status: Every Day    Packs/day: 1.00    Years: 15.00    Total pack years: 15.00    Types: Cigarettes    Last attempt to quit: 04/10/2004    Years since quitting: 17.4   Smokeless tobacco: Never  Vaping Use   Vaping Use: Never used  Substance Use Topics   Alcohol use: Yes    Alcohol/week: 2.0 standard drinks of alcohol    Types: 2 Standard drinks or equivalent per week    Comment: ocassional   Drug use: Yes    Comment: prescribed percocet for chronic pain     Medication list has been reviewed and updated.  Current Meds  Medication Sig   amitriptyline (ELAVIL) 10 MG tablet Take 20-30 mg by mouth at bedtime as needed.   cyanocobalamin (,VITAMIN B-12,) 1000 MCG/ML injection Inject 1 mL (1,000 mcg total) into the muscle every 30 (thirty) days.   naloxone (NARCAN) nasal spray 4 mg/0.1 mL SMARTSIG:Both Nares   ondansetron (ZOFRAN-ODT) 8 MG disintegrating tablet Take 1 tablet (8 mg total) by mouth every 8 (eight) hours as needed for nausea or vomiting.   oxyCODONE (ROXICODONE) 15 MG immediate release tablet SMARTSIG:1-2 Tablet(s) By Mouth 2-3 Times Daily   promethazine (PHENERGAN) 12.5 MG tablet Take 1 tablet (12.5 mg total) by mouth every 6 (six) hours as needed for nausea or vomiting.   tiZANidine (ZANAFLEX) 2 MG  tablet Take 2 mg by mouth at bedtime as needed for muscle spasms.        09/16/2021    8:06 AM 09/09/2020   10:24 AM 05/05/2019    8:23 AM 08/29/2017    9:14 AM  GAD 7 : Generalized Anxiety Score  Nervous, Anxious, on Edge 0 0 3 3  Control/stop worrying 0 0 3 3  Worry too much - different things 0 '2 3 3  ' Trouble relaxing 0 0 3 3  Restless 0 0 3 3  Easily annoyed or irritable 0 0 3 3  Afraid - awful might happen 0 0 1 3  Total GAD 7 Score 0 '2 19 21  ' Anxiety Difficulty Not difficult at all  Somewhat difficult Extremely difficult       09/16/2021    8:06 AM  Depression screen PHQ 2/9  Decreased Interest 0  Down, Depressed, Hopeless 0  PHQ - 2 Score 0  Altered sleeping 2  Tired, decreased energy 0  Change in appetite  0  Feeling bad or failure about yourself  0  Trouble concentrating 0  Moving slowly or fidgety/restless 0  Suicidal thoughts 0  PHQ-9 Score 2  Difficult doing work/chores Not difficult at all    BP Readings from Last 3 Encounters:  09/16/21 100/60  09/04/21 130/71  09/09/20 104/68    Physical Exam Vitals and nursing note reviewed.  Constitutional:      General: She is not in acute distress.    Appearance: She is well-developed.  HENT:     Head: Normocephalic and atraumatic.     Right Ear: Tympanic membrane and ear canal normal.     Left Ear: Tympanic membrane and ear canal normal.     Nose:     Right Sinus: No maxillary sinus tenderness.     Left Sinus: No maxillary sinus tenderness.  Eyes:     General: No scleral icterus.       Right eye: No discharge.        Left eye: No discharge.     Conjunctiva/sclera: Conjunctivae normal.  Neck:     Thyroid: No thyromegaly.     Vascular: No carotid bruit.  Cardiovascular:     Rate and Rhythm: Normal rate and regular rhythm.     Pulses: Normal pulses.     Heart sounds: Normal heart sounds.  Pulmonary:     Effort: Pulmonary effort is normal. No respiratory distress.     Breath sounds: No wheezing.  Chest:   Breasts:    Right: No mass, nipple discharge, skin change or tenderness.     Left: No mass, nipple discharge, skin change or tenderness.  Abdominal:     General: Bowel sounds are normal.     Palpations: Abdomen is soft.     Tenderness: There is no abdominal tenderness.  Musculoskeletal:     Cervical back: Normal range of motion. No erythema.     Right knee: Normal.     Left knee: Crepitus present. No swelling or effusion. Tenderness present over the medial joint line.     Right lower leg: No edema.     Left lower leg: No edema.  Lymphadenopathy:     Cervical: No cervical adenopathy.  Skin:    General: Skin is warm and dry.     Findings: No rash.  Neurological:     Mental Status: She is alert and oriented to person, place, and time.     Cranial Nerves: No cranial nerve deficit.     Sensory: No sensory deficit.     Deep Tendon Reflexes: Reflexes are normal and symmetric.  Psychiatric:        Attention and Perception: Attention normal.        Mood and Affect: Mood normal.     Wt Readings from Last 3 Encounters:  09/16/21 161 lb (73 kg)  09/04/21 150 lb (68 kg)  09/09/20 159 lb (72.1 kg)    BP 100/60   Pulse 91   Ht '5\' 4"'  (1.626 m)   Wt 161 lb (73 kg)   SpO2 95%   BMI 27.64 kg/m   Assessment and Plan: 1. Annual physical exam Normal exam Continue healthy diet, exercise as able - Comprehensive metabolic panel - Hemoglobin A1c  2. Encounter for screening mammogram for breast cancer Schedule at [facility] - MM 3D SCREEN BREAST BILATERAL  3. Colon cancer screening deferred  4. Hyperlipidemia, mixed Check labs; low fat diet  - Lipid panel  5. Acquired hypothyroidism Not currently requiring supplementation -  TSH + free T4  6. Acute pain of left knee Begin Mobic daily Imaging to further evaluate the knee joint - DG Knee Complete 4 Views Left; Future - meloxicam (MOBIC) 15 MG tablet; Take 1 tablet (15 mg total) by mouth daily.  Dispense: 30 tablet; Refill:  0   Partially dictated using Editor, commissioning. Any errors are unintentional.  Halina Maidens, MD [facility]  09/16/2021

## 2021-09-17 LAB — HEMOGLOBIN A1C
Est. average glucose Bld gHb Est-mCnc: 108 mg/dL
Hgb A1c MFr Bld: 5.4 % (ref 4.8–5.6)

## 2021-09-17 LAB — COMPREHENSIVE METABOLIC PANEL
ALT: 11 IU/L (ref 0–32)
AST: 14 IU/L (ref 0–40)
Albumin/Globulin Ratio: 1.6 (ref 1.2–2.2)
Albumin: 3.6 g/dL — ABNORMAL LOW (ref 3.8–4.8)
Alkaline Phosphatase: 78 IU/L (ref 44–121)
BUN/Creatinine Ratio: 9 (ref 9–23)
BUN: 7 mg/dL (ref 6–24)
Bilirubin Total: <0.2 mg/dL (ref 0.0–1.2)
CO2: 24 mmol/L (ref 20–29)
Calcium: 8.5 mg/dL — ABNORMAL LOW (ref 8.7–10.2)
Chloride: 103 mmol/L (ref 96–106)
Creatinine, Ser: 0.81 mg/dL (ref 0.57–1.00)
Globulin, Total: 2.3 g/dL (ref 1.5–4.5)
Glucose: 86 mg/dL (ref 70–99)
Potassium: 4 mmol/L (ref 3.5–5.2)
Sodium: 138 mmol/L (ref 134–144)
Total Protein: 5.9 g/dL — ABNORMAL LOW (ref 6.0–8.5)
eGFR: 91 mL/min/{1.73_m2} (ref >59)

## 2021-09-17 LAB — LIPID PANEL
Chol/HDL Ratio: 5.7 ratio — ABNORMAL HIGH (ref 0.0–4.4)
Cholesterol, Total: 148 mg/dL (ref 100–199)
HDL: 26 mg/dL — ABNORMAL LOW (ref >39)
LDL Chol Calc (NIH): 80 mg/dL (ref 0–99)
Triglycerides: 254 mg/dL — ABNORMAL HIGH (ref 0–149)
VLDL Cholesterol Cal: 42 mg/dL — ABNORMAL HIGH (ref 5–40)

## 2021-09-17 LAB — TSH+FREE T4
Free T4: 1.27 ng/dL (ref 0.82–1.77)
TSH: 3.87 u[IU]/mL (ref 0.450–4.500)

## 2021-10-13 ENCOUNTER — Other Ambulatory Visit: Payer: Self-pay | Admitting: Internal Medicine

## 2021-10-13 DIAGNOSIS — M25562 Pain in left knee: Secondary | ICD-10-CM

## 2021-10-13 NOTE — Telephone Encounter (Signed)
Requested Prescriptions  Pending Prescriptions Disp Refills  . meloxicam (MOBIC) 15 MG tablet [Pharmacy Med Name: MELOXICAM 15MG TABLETS] 90 tablet 0    Sig: TAKE 1 TABLET(15 MG) BY MOUTH DAILY     Analgesics:  COX2 Inhibitors Failed - 10/13/2021  7:50 AM      Failed - Manual Review: Labs are only required if the patient has taken medication for more than 8 weeks.      Failed - HGB in normal range and within 360 days    Hemoglobin  Date Value Ref Range Status  09/04/2021 16.4 (H) 12.0 - 15.0 g/dL Final  09/09/2020 14.7 11.1 - 15.9 g/dL Final         Failed - HCT in normal range and within 360 days    HCT  Date Value Ref Range Status  09/04/2021 48.3 (H) 36.0 - 46.0 % Final   Hematocrit  Date Value Ref Range Status  09/09/2020 43.9 34.0 - 46.6 % Final         Passed - Cr in normal range and within 360 days    Creatinine, Ser  Date Value Ref Range Status  09/16/2021 0.81 0.57 - 1.00 mg/dL Final         Passed - AST in normal range and within 360 days    AST  Date Value Ref Range Status  09/16/2021 14 0 - 40 IU/L Final         Passed - ALT in normal range and within 360 days    ALT  Date Value Ref Range Status  09/16/2021 11 0 - 32 IU/L Final         Passed - eGFR is 30 or above and within 360 days    GFR calc Af Amer  Date Value Ref Range Status  05/05/2019 84 >59 mL/min/1.73 Final   GFR, Estimated  Date Value Ref Range Status  09/04/2021 >60 >60 mL/min Final    Comment:    (NOTE) Calculated using the CKD-EPI Creatinine Equation (2021)    eGFR  Date Value Ref Range Status  09/16/2021 91 >59 mL/min/1.73 Final         Passed - Patient is not pregnant      Passed - Valid encounter within last 12 months    Recent Outpatient Visits          3 weeks ago Annual physical exam   [facility] Glean Hess, MD   1 year ago Annual physical exam   [facility] Glean Hess, MD   2 years ago Annual physical exam   [facility]  Glean Hess, MD   3 years ago Moderate episode of recurrent major depressive disorder [facility])   [facility] Glean Hess, MD   3 years ago Annual physical exam   [facility] Glean Hess, MD      Future Appointments            In 77 months Army Melia Jesse Sans, MD [facility], [facility]

## 2021-10-20 ENCOUNTER — Ambulatory Visit
Admission: RE | Admit: 2021-10-20 | Discharge: 2021-10-20 | Disposition: A | Discharge status: Home / Self Care | Payer: Commercial Managed Care - PPO | Source: Ambulatory Visit | Attending: Internal Medicine | Admitting: Internal Medicine

## 2021-10-20 DIAGNOSIS — Z1231 Encounter for screening mammogram for malignant neoplasm of breast: Secondary | ICD-10-CM | POA: Diagnosis present

## 2021-11-07 ENCOUNTER — Encounter: Payer: Self-pay | Admitting: Internal Medicine

## 2021-12-19 ENCOUNTER — Other Ambulatory Visit: Payer: Self-pay | Admitting: Internal Medicine

## 2021-12-19 DIAGNOSIS — M25562 Pain in left knee: Secondary | ICD-10-CM

## 2021-12-21 NOTE — Telephone Encounter (Signed)
Requested Prescriptions  Pending Prescriptions Disp Refills  . meloxicam (MOBIC) 15 MG tablet [Pharmacy Med Name: MELOXICAM 15MG TABLETS] 90 tablet 0    Sig: TAKE 1 TABLET(15 MG) BY MOUTH DAILY     Analgesics:  COX2 Inhibitors Failed - 12/19/2021  6:50 PM      Failed - Manual Review: Labs are only required if the patient has taken medication for more than 8 weeks.      Failed - HGB in normal range and within 360 days    Hemoglobin  Date Value Ref Range Status  09/04/2021 16.4 (H) 12.0 - 15.0 g/dL Final  09/09/2020 14.7 11.1 - 15.9 g/dL Final         Failed - HCT in normal range and within 360 days    HCT  Date Value Ref Range Status  09/04/2021 48.3 (H) 36.0 - 46.0 % Final   Hematocrit  Date Value Ref Range Status  09/09/2020 43.9 34.0 - 46.6 % Final         Passed - Cr in normal range and within 360 days    Creatinine, Ser  Date Value Ref Range Status  09/16/2021 0.81 0.57 - 1.00 mg/dL Final         Passed - AST in normal range and within 360 days    AST  Date Value Ref Range Status  09/16/2021 14 0 - 40 IU/L Final         Passed - ALT in normal range and within 360 days    ALT  Date Value Ref Range Status  09/16/2021 11 0 - 32 IU/L Final         Passed - eGFR is 30 or above and within 360 days    GFR calc Af Amer  Date Value Ref Range Status  05/05/2019 84 >59 mL/min/1.73 Final   GFR, Estimated  Date Value Ref Range Status  09/04/2021 >60 >60 mL/min Final    Comment:    (NOTE) Calculated using the CKD-EPI Creatinine Equation (2021)    eGFR  Date Value Ref Range Status  09/16/2021 91 >59 mL/min/1.73 Final         Passed - Patient is not pregnant      Passed - Valid encounter within last 12 months    Recent Outpatient Visits          3 months ago Annual physical exam   [place] [facility] at [facility], Jesse Sans, MD   1 year ago Annual physical exam   [place] [facility] at [facility], Jesse Sans, MD   2 years ago Annual physical exam   [place] [facility] at [facility], Jesse Sans, MD   3 years ago Moderate episode of recurrent major depressive disorder [facility])   [place] [facility] at [facility], Jesse Sans, MD   3 years ago Annual physical exam   [facility] at [facility], Jesse Sans, MD      Future Appointments            In 9 months Army Melia, Jesse Sans, MD [facility] at [facility], [facility]

## 2022-01-20 ENCOUNTER — Ambulatory Visit (INDEPENDENT_AMBULATORY_CARE_PROVIDER_SITE_OTHER): Payer: Commercial Managed Care - PPO | Admitting: Orthopedic Surgery

## 2022-01-20 ENCOUNTER — Encounter: Payer: Self-pay | Admitting: Orthopedic Surgery

## 2022-01-20 VITALS — BP 124/70 | HR 83 | Ht 64.0 in | Wt 163.8 lb

## 2022-01-20 DIAGNOSIS — M25562 Pain in left knee: Secondary | ICD-10-CM | POA: Diagnosis not present

## 2022-01-20 DIAGNOSIS — G8929 Other chronic pain: Secondary | ICD-10-CM | POA: Diagnosis not present

## 2022-01-20 MED ORDER — METHYLPREDNISOLONE ACETATE 40 MG/ML IJ SUSP
40.0000 mg | Freq: Once | INTRAMUSCULAR | Status: AC
  Administered 2022-01-20: 40 mg via INTRA_ARTICULAR

## 2022-01-22 ENCOUNTER — Encounter: Payer: Self-pay | Admitting: Orthopedic Surgery

## 2022-01-22 NOTE — Progress Notes (Signed)
New Patient Visit  Assessment: Theresa Murphy is a 46 y.o. female with the following: 1. Chronic pain of left knee  Plan: Mizuki Hoel has pain in her left knee.  No specific injury.  Symptoms have been intermittent, but have been more severe over the past couple of weeks.  Medications are not helping.  I have recommended a steroid injection, and she elected to proceed.  This was completed in clinic today without issues.  She is to remain active.  We have placed a referral to physical therapy.  She is in agreement this plan.  Follow-up as needed.  Procedure note injection Left knee joint   Verbal consent was obtained to inject the left knee joint  Timeout was completed to confirm the site of injection.  The skin was prepped with alcohol and ethyl chloride was sprayed at the injection site.  A 21-gauge needle was used to inject 40 mg of Depo-Medrol and 1% lidocaine (3 cc) into the left knee using an anterolateral approach.  There were no complications. A sterile bandage was applied.     Follow-up: Return if symptoms worsen or fail to improve.  Subjective:  Chief Complaint  Patient presents with   New Patient (Initial Visit)   Knee Pain    LT knee/ pain comes and goes but this time it is painful x 2 wks//hurts to walk, bend it or move. Feels like she needs to move it or get it to pop but she can't get it to pop    History of Present Illness: Theresa Murphy is a 46 y.o. female who presents for evaluation of left knee pain.  She has had pain in the left knee for several months.  It has been intermittent.  However, over the past week, it has been severe.  She has difficulty bending her knee.  She has noticed some swelling.  Pain gets worse throughout the day.  She did not twist her knee.  No history of injuries to her left knee.  She has been taking prescription NSAIDs, with limited improvement in her symptoms.  No prior injections.   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   Anemia    Anxiety    Arthritis    Chronic kidney disease    Major depression single episode, in partial remission (HCC) 08/29/2017   Migraine has had since cervical fusion 2015   Sterilization consult 11/15/2014    Past Surgical History:  Procedure Laterality Date   APPENDECTOMY     CERVICAL FUSION  2015   [facility]   ECTOPIC PREGNANCY SURGERY     x 2   LAPAROSCOPIC BILATERAL SALPINGECTOMY Bilateral 05/30/2018   Procedure: LAPAROSCOPIC BILATERAL SALPINGECTOMY;  Surgeon: Nadara Mustard, MD;  Location: [facility] ORS;  Service: Gynecology;  Laterality: Bilateral;   SHOULDER ARTHROSCOPY Right 2014   TONSILLECTOMY      Family History  Problem Relation Age of Onset   Thyroid disease Mother    Diabetes Father    CAD Father    Breast cancer Neg Hx    Social History   Tobacco Use   Smoking status: Every Day    Packs/day: 1.00    Years: 15.00    Total pack years: 15.00    Types:  Cigarettes    Last attempt to quit: 04/10/2004    Years since quitting: 17.7   Smokeless tobacco: Never  Vaping Use   Vaping Use: Never used  Substance Use Topics   Alcohol use: Yes    Alcohol/week: 2.0 standard drinks of alcohol    Types: 2 Standard drinks or equivalent per week    Comment: ocassional   Drug use: Yes    Comment: prescribed percocet for chronic pain    Allergies  Allergen Reactions   Cymbalta [Duloxetine Hcl] Nausea Only    Current Meds  Medication Sig   amitriptyline (ELAVIL) 10 MG tablet Take 20-30 mg by mouth at bedtime as needed.   cyanocobalamin (,VITAMIN B-12,) 1000 MCG/ML injection Inject 1 mL (1,000 mcg total) into the muscle every 30 (thirty) days.   meloxicam (MOBIC) 15 MG tablet TAKE 1 TABLET(15 MG) BY MOUTH DAILY    naloxone (NARCAN) nasal spray 4 mg/0.1 mL SMARTSIG:Both Nares   oxyCODONE (ROXICODONE) 15 MG immediate release tablet SMARTSIG:1-2 Tablet(s) By Mouth 2-3 Times Daily   rizatriptan (MAXALT-MLT) 10 MG disintegrating tablet Take 10 mg by mouth as needed for migraine.    Objective: BP 124/70   Pulse 83   Ht 5\' 4"  (1.626 m)   Wt 163 lb 12.8 oz (74.3 kg)   BMI 28.12 kg/m   Physical Exam:  General: Alert and oriented. and No acute distress. Gait: Left sided antalgic gait.  Left knee without obvious effusion.  No redness.  Some grinding is appreciated with range of motion.  She is able to achieve full extension.  She tolerates flexion beyond 90 degrees.  No increased laxity varus or valgus stress.  Negative Lachman.  IMAGING: I personally reviewed images previously obtained in clinic  Previous x-rays demonstrate no acute injury.  Minimal degenerative changes.   New Medications:  Meds ordered this encounter  Medications   methylPREDNISolone acetate (DEPO-MEDROL) injection 40 mg      Mordecai Rasmussen, MD  01/22/2022 11:02 PM

## 2022-01-22 NOTE — Patient Instructions (Signed)

## 2022-03-23 ENCOUNTER — Other Ambulatory Visit: Payer: Self-pay | Admitting: Internal Medicine

## 2022-03-23 DIAGNOSIS — M25562 Pain in left knee: Secondary | ICD-10-CM

## 2022-06-08 ENCOUNTER — Encounter: Payer: Self-pay | Admitting: Radiology

## 2022-06-27 ENCOUNTER — Other Ambulatory Visit: Payer: Self-pay | Admitting: Internal Medicine

## 2022-06-27 DIAGNOSIS — M25562 Pain in left knee: Secondary | ICD-10-CM

## 2022-06-28 NOTE — Telephone Encounter (Signed)
Requested Prescriptions  Pending Prescriptions Disp Refills   meloxicam (MOBIC) 15 MG tablet [Pharmacy Med Name: MELOXICAM 15MG  TABLETS] 90 tablet 0    Sig: TAKE 1 TABLET(15 MG) BY MOUTH DAILY     Analgesics:  COX2 Inhibitors Failed - 06/27/2022 11:14 AM      Failed - Manual Review: Labs are only required if the patient has taken medication for more than 8 weeks.      Failed - HGB in normal range and within 360 days    Hemoglobin  Date Value Ref Range Status  09/04/2021 16.4 (H) 12.0 - 15.0 g/dL Final  09/09/2020 14.7 11.1 - 15.9 g/dL Final         Failed - HCT in normal range and within 360 days    HCT  Date Value Ref Range Status  09/04/2021 48.3 (H) 36.0 - 46.0 % Final   Hematocrit  Date Value Ref Range Status  09/09/2020 43.9 34.0 - 46.6 % Final         Passed - Cr in normal range and within 360 days    Creatinine, Ser  Date Value Ref Range Status  09/16/2021 0.81 0.57 - 1.00 mg/dL Final         Passed - AST in normal range and within 360 days    AST  Date Value Ref Range Status  09/16/2021 14 0 - 40 IU/L Final         Passed - ALT in normal range and within 360 days    ALT  Date Value Ref Range Status  09/16/2021 11 0 - 32 IU/L Final         Passed - eGFR is 30 or above and within 360 days    GFR calc Af Amer  Date Value Ref Range Status  05/05/2019 84 >59 mL/min/1.73 Final   GFR, Estimated  Date Value Ref Range Status  09/04/2021 >60 >60 mL/min Final    Comment:    (NOTE) Calculated using the CKD-EPI Creatinine Equation (2021)    eGFR  Date Value Ref Range Status  09/16/2021 91 >59 mL/min/1.73 Final         Passed - Patient is not pregnant      Passed - Valid encounter within last 12 months    Recent Outpatient Visits           9 months ago Annual physical exam   [place] [facility] at [facility], Jesse Sans, MD   1 year ago Annual physical exam   [facility] at [place], Jesse Sans, MD   3 years ago Annual physical exam   [facility] at [facility], Jesse Sans, MD   4 years ago Moderate episode of recurrent major depressive disorder [facility])   [place] [facility] at [facility], Jesse Sans, MD   4 years ago Annual physical exam   [facility] at [facility], Jesse Sans, MD       Future Appointments             In 2 months Army Melia, Jesse Sans, MD [place] [facility], [facility]

## 2022-09-19 ENCOUNTER — Ambulatory Visit (INDEPENDENT_AMBULATORY_CARE_PROVIDER_SITE_OTHER): Payer: Commercial Managed Care - PPO | Admitting: Internal Medicine

## 2022-09-19 ENCOUNTER — Encounter: Payer: Self-pay | Admitting: Internal Medicine

## 2022-09-19 VITALS — BP 122/78 | HR 98 | Ht 64.0 in | Wt 169.0 lb

## 2022-09-19 DIAGNOSIS — Z1231 Encounter for screening mammogram for malignant neoplasm of breast: Secondary | ICD-10-CM | POA: Diagnosis not present

## 2022-09-19 DIAGNOSIS — F411 Generalized anxiety disorder: Secondary | ICD-10-CM

## 2022-09-19 DIAGNOSIS — Z0000 Encounter for general adult medical examination without abnormal findings: Secondary | ICD-10-CM | POA: Diagnosis not present

## 2022-09-19 DIAGNOSIS — G43109 Migraine with aura, not intractable, without status migrainosus: Secondary | ICD-10-CM

## 2022-09-19 DIAGNOSIS — E039 Hypothyroidism, unspecified: Secondary | ICD-10-CM | POA: Diagnosis not present

## 2022-09-19 DIAGNOSIS — Z1211 Encounter for screening for malignant neoplasm of colon: Secondary | ICD-10-CM | POA: Diagnosis not present

## 2022-09-19 DIAGNOSIS — E782 Mixed hyperlipidemia: Secondary | ICD-10-CM

## 2022-09-19 MED ORDER — LORAZEPAM 0.5 MG PO TABS: 0.5000 mg | ORAL_TABLET | Freq: Two times a day (BID) | ORAL | 0 refills | Status: AC | PRN

## 2022-09-19 MED ORDER — RIZATRIPTAN BENZOATE 10 MG PO TBDP: 10.0000 mg | ORAL_TABLET | ORAL | 1 refills | Status: DC | PRN

## 2022-09-19 NOTE — Assessment & Plan Note (Signed)
Lipids managed with diet changes. The 10-year ASCVD risk score (Arnett DK, et al., 2019) is: 5.2%   Values used to calculate the score:     Age: 47 years     Sex: Female     Is Non-Hispanic African American: No     Diabetic: No     Tobacco smoker: Yes     Systolic Blood Pressure: 124 mmHg     Is BP treated: No     HDL Cholesterol: 26 mg/dL     Total Cholesterol: 148 mg/dL

## 2022-09-19 NOTE — Assessment & Plan Note (Signed)
Not currently on supplementation

## 2022-09-19 NOTE — Progress Notes (Signed)
Date:  09/19/2022   Name:  Theresa Murphy   DOB:  08/07/1975   MRN:  [id number]   Chief Complaint: Annual Exam Theresa Murphy is a 47 y.o. female who presents today for her Complete Annual Exam. She feels poorly. She reports exercising some. She reports she is sleeping poorly. Breast complaints - none.  Mammogram: 10/2021 DEXA: 2014 normal Pap smear: 03/2018 neg/neg Colonoscopy: none  Health Maintenance Due  Topic Date Due   HIV Screening  Never done   DTaP/Tdap/Td (1 - Tdap) Never done   Pneumococcal Vaccine 48-56 Years old (2 of 2 - PCV) 03/05/2015   Colonoscopy  Never done   COVID-19 Vaccine (3 - 2023-24 season) 12/09/2021    Immunization History  Administered Date(s) Administered   PFIZER Comirnaty(Gray Top)Covid-19 Tri-Sucrose Vaccine 10/27/2019, 11/17/2019   Pneumococcal Polysaccharide-23 03/04/2014    HPI Back pain - she decided to wean off of Oxycodone about 2 weeks ago.  She actually just stopped completely.  She is having mild diarrhea, sweats and anxiety.  Appetite is normal.  Previously seeing Dr. Metta Clines who moved/retired and she was not happy with his replacement.  Lab Results  Component Value Date   NA 138 09/16/2021   K 4.0 09/16/2021   CO2 24 09/16/2021   GLUCOSE 86 09/16/2021   BUN 7 09/16/2021   CREATININE 0.81 09/16/2021   CALCIUM 8.5 (L) 09/16/2021   EGFR 91 09/16/2021   GFRNONAA >60 09/04/2021   Lab Results  Component Value Date   CHOL 148 09/16/2021   HDL 26 (L) 09/16/2021   LDLCALC 80 09/16/2021   TRIG 254 (H) 09/16/2021   CHOLHDL 5.7 (H) 09/16/2021   Lab Results  Component Value Date   TSH 3.870 09/16/2021   Lab Results  Component Value Date   HGBA1C 5.4 09/16/2021   Lab Results  Component Value Date   WBC 13.0 (H) 09/04/2021   HGB 16.4 (H) 09/04/2021   HCT 48.3 (H) 09/04/2021   MCV 96.2 09/04/2021   PLT 332 09/04/2021   Lab Results  Component Value Date   ALT 11 09/16/2021   AST 14 09/16/2021   ALKPHOS 78 09/16/2021    BILITOT <0.2 09/16/2021   No results found for: "25OHVITD2", "25OHVITD3", "VD25OH"   Review of Systems  Constitutional:  Positive for diaphoresis. Negative for chills, fatigue and fever.  HENT:  Negative for congestion, hearing loss, tinnitus, trouble swallowing and voice change.   Eyes:  Negative for visual disturbance.  Respiratory:  Negative for cough, chest tightness, shortness of breath and wheezing.   Cardiovascular:  Negative for chest pain, palpitations and leg swelling.  Gastrointestinal:  Negative for abdominal pain, constipation, diarrhea and vomiting.  Endocrine: Negative for polydipsia and polyuria.  Genitourinary:  Negative for dysuria, frequency, genital sores and menstrual problem.  Musculoskeletal:  Positive for back pain. Negative for arthralgias, gait problem and joint swelling.  Skin:  Negative for color change and rash.  Neurological:  Positive for headaches. Negative for dizziness, tremors and light-headedness.  Hematological:  Negative for adenopathy. Does not bruise/bleed easily.  Psychiatric/Behavioral:  Positive for sleep disturbance. Negative for dysphoric mood. The patient is nervous/anxious.     Patient Active Problem List   Diagnosis Date Noted   Dyspareunia due to medical condition in female 05/15/2018   Hyperlipidemia, mixed 03/23/2018   History of arthrodesis 08/29/2017   Pap smear abnormality of cervix with ASCUS favoring benign 08/14/2017   Drug-induced constipation 07/20/2017   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  DDD (degenerative disc disease), cervical 02/09/2015   Cervical fusion syndrome 02/09/2015   Cervical facet syndrome 02/09/2015   Bilateral occipital neuralgia 02/09/2015   DDD (degenerative disc disease), 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/2015   DDD (degenerative disc disease), lumbar 02/09/2015   Generalized anxiety disorder 01/08/2015   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   Acquired hypothyroidism 11/15/2014   Irritable bowel syndrome with  constipation 11/15/2014   OP (osteoporosis) 11/15/2014    Allergies  Allergen Reactions   Cymbalta [Duloxetine Hcl] Nausea Only    Past Surgical History:  Procedure Laterality Date   APPENDECTOMY     CERVICAL FUSION  2015   [facility]   ECTOPIC PREGNANCY SURGERY     x 2   LAPAROSCOPIC BILATERAL SALPINGECTOMY Bilateral 05/30/2018   Procedure: LAPAROSCOPIC BILATERAL SALPINGECTOMY;  Surgeon: Nadara Mustard, MD;  Location: [facility];  Service: Gynecology;  Laterality: Bilateral;   SHOULDER ARTHROSCOPY Right 2014   TONSILLECTOMY      Social History   Tobacco Use   Smoking status: Every Day    Packs/day: 1.00    Years: 2.00    Additional pack years: 0.00    Total pack years: 2.00    Types: Cigarettes    Last attempt to quit: 04/10/2004    Years since quitting: 18.4   Smokeless tobacco: Never  Vaping Use   Vaping Use: Never used  Substance Use Topics   Alcohol use: Yes    Alcohol/week: 2.0 standard drinks of alcohol    Types: 2 Standard drinks or equivalent per week    Comment: ocassional   Drug use: Yes    Comment: prescribed percocet for chronic pain     Medication list has been reviewed and updated.  Current Meds  Medication Sig   cyanocobalamin (,VITAMIN B-12,) 1000 MCG/ML injection Inject 1 mL (1,000 mcg total) into the muscle every 30 (thirty) days.   LORazepam (ATIVAN) 0.5 MG tablet Take 1 tablet (0.5 mg total) by mouth 2 (two) times daily as needed for anxiety.   meloxicam (MOBIC) 15 MG tablet TAKE 1 TABLET(15 MG) BY MOUTH DAILY   [DISCONTINUED] amitriptyline (ELAVIL) 10 MG tablet Take 20-30 mg by mouth at bedtime as needed.   [DISCONTINUED] Buprenorphine HCl-Naloxone HCl 4-1 MG FILM Place under the tongue daily.   [DISCONTINUED] tiZANidine (ZANAFLEX) 2 MG tablet Take 2 mg by mouth 2 (two) times daily.       09/19/2022    8:25 AM 09/16/2021    8:06 AM 09/09/2020   10:24 AM 05/05/2019    8:23 AM  GAD 7 : Generalized Anxiety Score  Nervous, Anxious,  on Edge 2 0 0 3  Control/stop worrying 2 0 0 3  Worry too much - different things 2 0 2 3  Trouble relaxing 2 0 0 3  Restless 1 0 0 3  Easily annoyed or irritable 0 0 0 3  Afraid - awful might happen 2 0 0 1  Total GAD 7 Score 11 0 2 19  Anxiety Difficulty Very difficult Not difficult at all  Somewhat difficult       09/19/2022    8:25 AM 09/16/2021    8:06 AM 09/09/2020   10:23 AM  Depression screen PHQ 2/9  Decreased Interest 2 0 0  Down, Depressed, Hopeless 2 0 0  PHQ - 2 Score 4 0 0  Altered sleeping 3 2 0  Tired, decreased energy 3 0 0  Change in appetite 2 0 0  Feeling bad or failure about yourself  2 0 0  Trouble concentrating 3 0 0  Moving slowly or fidgety/restless 2 0 0  Suicidal thoughts 0 0 0  PHQ-9 Score 19 2 0  Difficult doing work/chores Very difficult Not difficult at all Not difficult at all    BP Readings from Last 3 Encounters:  09/19/22 122/78  01/20/22 124/70  09/16/21 100/60    Physical Exam Vitals and nursing note reviewed.  Constitutional:      General: She is not in acute distress.    Appearance: She is well-developed. She is diaphoretic.  HENT:     Head: Normocephalic and atraumatic.     Right Ear: Tympanic membrane and ear canal normal.     Left Ear: Tympanic membrane and ear canal normal.     Nose:     Right Sinus: No maxillary sinus tenderness.     Left Sinus: No maxillary sinus tenderness.  Eyes:     General: No scleral icterus.       Right eye: No discharge.        Left eye: No discharge.     Conjunctiva/sclera: Conjunctivae normal.  Neck:     Thyroid: No thyromegaly.     Vascular: No carotid bruit.  Cardiovascular:     Rate and Rhythm: Normal rate and regular rhythm.     Pulses: Normal pulses.     Heart sounds: Normal heart sounds.  Pulmonary:     Effort: Pulmonary effort is normal. No respiratory distress.     Breath sounds: No wheezing.  Chest:  Breasts:    Right: No mass, nipple discharge, skin change or tenderness.      Left: No mass, nipple discharge, skin change or tenderness.  Abdominal:     General: Bowel sounds are normal.     Palpations: Abdomen is soft.     Tenderness: There is no abdominal tenderness.  Musculoskeletal:     Cervical back: Normal range of motion. No erythema.     Right lower leg: No edema.     Left lower leg: No edema.  Lymphadenopathy:     Cervical: No cervical adenopathy.  Skin:    General: Skin is warm.     Capillary Refill: Capillary refill takes less than 2 seconds.     Findings: No rash.  Neurological:     General: No focal deficit present.     Mental Status: She is alert and oriented to person, place, and time.     Cranial Nerves: No cranial nerve deficit.     Sensory: No sensory deficit.     Deep Tendon Reflexes: Reflexes are normal and symmetric.  Psychiatric:        Attention and Perception: Attention normal.        Mood and Affect: Mood normal.     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 The 10-year ASCVD risk score (Arnett DK, et al., 2019) is: 5.2%   Values used to calculate  the score:     Age: 76 years     Sex: Female     Is Non-Hispanic African American: No     Diabetic: No     Tobacco smoker: Yes     Systolic Blood Pressure: 124 mmHg     Is BP treated: No     HDL Cholesterol: 26 mg/dL     Total Cholesterol: 148 mg/dL       Relevant Orders   Lipid panel   Generalized anxiety disorder    Moderate physical symptoms related to d/c'ing narcotics. Will give a few lorazepam to use prn      Relevant  Medications   LORazepam (ATIVAN) 0.5 MG tablet   Acquired hypothyroidism (Chronic)    Not currently on supplementation      Relevant Orders   TSH   Other Visit Diagnoses     Annual physical exam    -  Primary   Relevant Orders   CBC with Differential/Platelet   Comprehensive metabolic panel   Lipid panel   TSH   Encounter for screening mammogram for breast cancer       Relevant Orders   MM 3D SCREENING MAMMOGRAM BILATERAL BREAST   Colon cancer screening       will start screening age 30       No follow-ups on file.   Partially dictated using Dragon software, any errors are not intentional.  Reubin Milan, MD [facility] [place], [place]

## 2022-09-19 NOTE — Patient Instructions (Signed)
Call [facility] to schedule your mammogram at [phone].  

## 2022-09-19 NOTE — Assessment & Plan Note (Signed)
Moderate physical symptoms related to d/c'ing narcotics. Will give a few lorazepam to use prn

## 2022-09-20 LAB — CBC WITH DIFFERENTIAL/PLATELET
Basophils Absolute: 0.1 10*3/uL (ref 0.0–0.2)
Basos: 1 %
EOS (ABSOLUTE): 0.1 10*3/uL (ref 0.0–0.4)
Eos: 1 %
Hematocrit: 47.2 % — ABNORMAL HIGH (ref 34.0–46.6)
Hemoglobin: 15.7 g/dL (ref 11.1–15.9)
Immature Grans (Abs): 0 10*3/uL (ref 0.0–0.1)
Immature Granulocytes: 0 %
Lymphocytes Absolute: 1.8 10*3/uL (ref 0.7–3.1)
Lymphs: 28 %
MCH: 33.1 pg — ABNORMAL HIGH (ref 26.6–33.0)
MCHC: 33.3 g/dL (ref 31.5–35.7)
MCV: 99 fL — ABNORMAL HIGH (ref 79–97)
Monocytes Absolute: 0.4 10*3/uL (ref 0.1–0.9)
Monocytes: 6 %
Neutrophils Absolute: 4.2 10*3/uL (ref 1.4–7.0)
Neutrophils: 64 %
Platelets: 291 10*3/uL (ref 150–450)
RBC: 4.75 x10E6/uL (ref 3.77–5.28)
RDW: 12.2 % (ref 11.7–15.4)
WBC: 6.5 10*3/uL (ref 3.4–10.8)

## 2022-09-20 LAB — COMPREHENSIVE METABOLIC PANEL
ALT: 8 IU/L (ref 0–32)
AST: 15 IU/L (ref 0–40)
Albumin/Globulin Ratio: 1.6
Albumin: 4.4 g/dL (ref 3.9–4.9)
Alkaline Phosphatase: 108 IU/L (ref 44–121)
BUN/Creatinine Ratio: 13 (ref 9–23)
BUN: 14 mg/dL (ref 6–24)
Bilirubin Total: 0.6 mg/dL (ref 0.0–1.2)
CO2: 25 mmol/L (ref 20–29)
Calcium: 9.4 mg/dL (ref 8.7–10.2)
Chloride: 106 mmol/L (ref 96–106)
Creatinine, Ser: 1.12 mg/dL — ABNORMAL HIGH (ref 0.57–1.00)
Globulin, Total: 2.8 g/dL (ref 1.5–4.5)
Glucose: 94 mg/dL (ref 70–99)
Potassium: 5.4 mmol/L — ABNORMAL HIGH (ref 3.5–5.2)
Sodium: 142 mmol/L (ref 134–144)
Total Protein: 7.2 g/dL (ref 6.0–8.5)
eGFR: 61 mL/min/{1.73_m2} (ref >59)

## 2022-09-20 LAB — LIPID PANEL
Chol/HDL Ratio: 6.8 ratio — ABNORMAL HIGH (ref 0.0–4.4)
Cholesterol, Total: 231 mg/dL — ABNORMAL HIGH (ref 100–199)
HDL: 34 mg/dL — ABNORMAL LOW (ref >39)
LDL Chol Calc (NIH): 161 mg/dL — ABNORMAL HIGH (ref 0–99)
Triglycerides: 193 mg/dL — ABNORMAL HIGH (ref 0–149)
VLDL Cholesterol Cal: 36 mg/dL (ref 5–40)

## 2022-09-20 LAB — TSH: TSH: 2.88 u[IU]/mL (ref 0.450–4.500)

## 2022-10-05 ENCOUNTER — Other Ambulatory Visit: Payer: Self-pay | Admitting: Internal Medicine

## 2022-10-05 DIAGNOSIS — M25562 Pain in left knee: Secondary | ICD-10-CM

## 2022-11-07 ENCOUNTER — Ambulatory Visit: Payer: Commercial Managed Care - PPO

## 2023-02-20 ENCOUNTER — Other Ambulatory Visit: Payer: Self-pay | Admitting: Internal Medicine

## 2023-02-20 DIAGNOSIS — G43109 Migraine with aura, not intractable, without status migrainosus: Secondary | ICD-10-CM

## 2023-02-21 NOTE — Telephone Encounter (Signed)
Requested Prescriptions  Pending Prescriptions Disp Refills   rizatriptan (MAXALT-MLT) 10 MG disintegrating tablet [Pharmacy Med Name: RIZATRIPTAN ODT 10MG  TABLETS] 10 tablet 0    Sig: TAKE 1 TABLET BY MOUTH AS NEEDED FOR MIGRAINE     Neurology:  Migraine Therapy - Triptan Passed - 02/20/2023 12:20 AM      Passed - Last BP in normal range    BP Readings from Last 1 Encounters:  09/19/22 122/78         Passed - Valid encounter within last 12 months    Recent Outpatient Visits           5 months ago Annual physical exam   [place] [facility] at [facility], Nyoka Cowden, MD   1 year ago Annual physical exam   [facility] at [facility], Nyoka Cowden, MD   2 years ago Annual physical exam   [facility] at [facility], Nyoka Cowden, MD   3 years ago Annual physical exam   [facility] at [facility], Nyoka Cowden, MD   4 years ago Moderate episode of recurrent major depressive disorder [facility])   [place] [facility] at [facility], Nyoka Cowden, MD       Future Appointments             In 7 months Judithann Graves, Nyoka Cowden, MD [facility] at [facility]

## 2023-09-21 ENCOUNTER — Encounter: Payer: Self-pay | Admitting: Internal Medicine

## 2023-09-21 ENCOUNTER — Ambulatory Visit (INDEPENDENT_AMBULATORY_CARE_PROVIDER_SITE_OTHER): Payer: Self-pay | Admitting: Internal Medicine

## 2023-09-21 ENCOUNTER — Other Ambulatory Visit (HOSPITAL_COMMUNITY)
Admission: RE | Admit: 2023-09-21 | Discharge: 2023-09-21 | Disposition: A | Discharge status: Home / Self Care | Source: Ambulatory Visit | Attending: Internal Medicine | Admitting: Internal Medicine

## 2023-09-21 VITALS — BP 122/76 | HR 99 | Ht 64.0 in | Wt 169.0 lb

## 2023-09-21 DIAGNOSIS — G43109 Migraine with aura, not intractable, without status migrainosus: Secondary | ICD-10-CM

## 2023-09-21 DIAGNOSIS — M5030 Other cervical disc degeneration, unspecified cervical region: Secondary | ICD-10-CM

## 2023-09-21 DIAGNOSIS — Z124 Encounter for screening for malignant neoplasm of cervix: Secondary | ICD-10-CM | POA: Insufficient documentation

## 2023-09-21 DIAGNOSIS — E782 Mixed hyperlipidemia: Secondary | ICD-10-CM

## 2023-09-21 DIAGNOSIS — E039 Hypothyroidism, unspecified: Secondary | ICD-10-CM | POA: Diagnosis not present

## 2023-09-21 DIAGNOSIS — E538 Deficiency of other specified B group vitamins: Secondary | ICD-10-CM

## 2023-09-21 DIAGNOSIS — Z1211 Encounter for screening for malignant neoplasm of colon: Secondary | ICD-10-CM

## 2023-09-21 DIAGNOSIS — Z1231 Encounter for screening mammogram for malignant neoplasm of breast: Secondary | ICD-10-CM | POA: Diagnosis not present

## 2023-09-21 DIAGNOSIS — Z0000 Encounter for general adult medical examination without abnormal findings: Secondary | ICD-10-CM

## 2023-09-21 MED ORDER — RIZATRIPTAN BENZOATE 10 MG PO TBDP
10.0000 mg | ORAL_TABLET | ORAL | 1 refills | Status: AC | PRN
Start: 2023-09-21 — End: ?

## 2023-09-21 MED ORDER — CYANOCOBALAMIN 1000 MCG/ML IJ SOLN
1000.0000 ug | INTRAMUSCULAR | 12 refills | Status: AC
Start: 2023-09-21 — End: ?

## 2023-09-21 NOTE — Progress Notes (Signed)
 Date:  09/21/2023   Name:  Theresa Murphy   DOB:  04-22-1975   MRN:  [id number]   Chief Complaint: Annual Exam Theresa Murphy is a 48 y.o. female who presents today for her Complete Annual Exam. She feels fairly well. She reports exercising walking, swimming 1 -2 times a week. She reports she is sleeping poorly. Breast complaints none.  Last menses about 8 mo ago with no hot flashes.  Health Maintenance  Topic Date Due   HIV Screening  Never done   DTaP/Tdap/Td vaccine (1 - Tdap) Never done   Pneumococcal Vaccination (2 of 2 - PCV) 03/05/2015   Colon Cancer Screening  Never done   Mammogram  10/21/2022   Pap with HPV screening  03/22/2023   COVID-19 Vaccine (3 - 2024-25 season) 10/06/2024*   Flu Shot  11/09/2023   Hepatitis C Screening  Completed   HPV Vaccine  Aged Out   Meningitis B Vaccine  Aged Out  *Topic was postponed. The date shown is not the original due date.    Neck Pain  This is a chronic problem. The problem occurs constantly. The problem has been gradually worsening. Associated symptoms include trouble swallowing (and hiccups with eating and drinking). Pertinent negatives include no chest pain, headaches or weakness. Treatments tried: s/p cervical fusion; seeing Neurosurgery.    Review of Systems  Constitutional:  Negative for chills, fatigue and unexpected weight change.  HENT:  Positive for trouble swallowing (and hiccups with eating and drinking). Negative for postnasal drip and sinus pressure.   Eyes:  Negative for visual disturbance.  Respiratory:  Negative for cough, chest tightness, shortness of breath and wheezing.   Cardiovascular:  Negative for chest pain, palpitations and leg swelling.  Gastrointestinal:  Negative for abdominal pain, constipation and diarrhea.  Genitourinary:  Negative for dysuria, hematuria and urgency.  Musculoskeletal:  Positive for neck pain. Negative for arthralgias and myalgias.  Allergic/Immunologic: Negative for  environmental allergies.  Neurological:  Negative for dizziness, weakness, light-headedness and headaches.  Psychiatric/Behavioral:  Negative for dysphoric mood and sleep disturbance. The patient is not nervous/anxious.      Lab Results  Component Value Date   NA 142 09/19/2022   K 5.4 (H) 09/19/2022   CO2 25 09/19/2022   GLUCOSE 94 09/19/2022   BUN 14 09/19/2022   CREATININE 1.12 (H) 09/19/2022   CALCIUM 9.4 09/19/2022   EGFR 61 09/19/2022   GFRNONAA >60 09/04/2021   Lab Results  Component Value Date   CHOL 231 (H) 09/19/2022   HDL 34 (L) 09/19/2022   LDLCALC 161 (H) 09/19/2022   TRIG 193 (H) 09/19/2022   CHOLHDL 6.8 (H) 09/19/2022   Lab Results  Component Value Date   TSH 2.880 09/19/2022   Lab Results  Component Value Date   HGBA1C 5.4 09/16/2021   Lab Results  Component Value Date   WBC 6.5 09/19/2022   HGB 15.7 09/19/2022   HCT 47.2 (H) 09/19/2022   MCV 99 (H) 09/19/2022   PLT 291 09/19/2022   Lab Results  Component Value Date   ALT 8 09/19/2022   AST 15 09/19/2022   ALKPHOS 108 09/19/2022   BILITOT 0.6 09/19/2022   No results found for: Lucetta Russel, VD25OH   Patient Active Problem List   Diagnosis Date Noted   Dyspareunia due to medical condition in female 05/15/2018   Hyperlipidemia, mixed 03/23/2018   History of arthrodesis 08/29/2017   Pap smear abnormality of cervix with ASCUS favoring benign 08/14/2017  Drug-induced constipation 07/20/2017   Dietary B12 deficiency 06/09/2016   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   DDD (degenerative disc disease), cervical 02/09/2015   Cervical fusion syndrome 02/09/2015   Cervical facet syndrome 02/09/2015   Bilateral occipital neuralgia 02/09/2015   DDD (degenerative disc disease), 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/2015   DDD (degenerative disc disease), lumbar 02/09/2015   Generalized anxiety disorder 01/08/2015   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   Acquired hypothyroidism 11/15/2014   Irritable bowel  syndrome with constipation 11/15/2014   OP (osteoporosis) 11/15/2014    Allergies  Allergen Reactions   Cymbalta  [Duloxetine  Hcl] Nausea Only    Past Surgical History:  Procedure Laterality Date   APPENDECTOMY     CERVICAL FUSION  2015   [facility]   ECTOPIC PREGNANCY SURGERY     x 2   LAPAROSCOPIC BILATERAL SALPINGECTOMY Bilateral 05/30/2018   Procedure: LAPAROSCOPIC BILATERAL SALPINGECTOMY;  Surgeon: Alben Alma, MD;  Location: [facility];  Service: Gynecology;  Laterality: Bilateral;   SHOULDER ARTHROSCOPY Right 2014   TONSILLECTOMY      Social History   Tobacco Use   Smoking status: Every Day    Current packs/day: 0.00    Average packs/day: 1 pack/day for 2.0 years (2.0 ttl pk-yrs)    Types: Cigarettes    Start date: 04/10/2002    Last attempt to quit: 04/10/2004    Years since quitting: 19.4   Smokeless tobacco: Never  Vaping Use   Vaping status: Never Used  Substance Use Topics   Alcohol use: Yes    Alcohol/week: 2.0 standard drinks of alcohol    Types: 2 Standard drinks or equivalent per week    Comment: ocassional   Drug use: Yes    Comment: prescribed percocet for chronic pain     Medication list has been reviewed and updated.  Current Meds  Medication Sig   LORazepam  (ATIVAN ) 0.5 MG tablet Take 1 tablet (0.5 mg total) by mouth 2 (two) times daily as needed for anxiety.   [DISCONTINUED] cyanocobalamin  (,VITAMIN B-12,) 1000 MCG/ML injection Inject 1 mL (1,000 mcg total) into the muscle every 30 (thirty) days.   [DISCONTINUED] rizatriptan  (MAXALT -MLT) 10 MG disintegrating tablet TAKE 1 TABLET BY MOUTH AS NEEDED FOR MIGRAINE       09/21/2023    8:32 AM 09/19/2022    8:25 AM 09/16/2021    8:06 AM 09/09/2020   10:24 AM  GAD 7 : Generalized Anxiety Score  Nervous, Anxious, on Edge 0 2 0 0  Control/stop worrying 1 2 0 0  Worry too much - different things 1 2 0 2  Trouble relaxing 1 2 0 0  Restless 0 1 0 0  Easily annoyed or irritable 1 0 0 0   Afraid - awful might happen 0 2 0 0  Total GAD 7 Score 4 11 0 2  Anxiety Difficulty Not difficult at all Very difficult Not difficult at all        09/21/2023    8:32 AM 09/19/2022    8:25 AM 09/16/2021    8:06 AM  Depression screen PHQ 2/9  Decreased Interest 1 2 0  Down, Depressed, Hopeless 1 2 0  PHQ - 2 Score 2 4 0  Altered sleeping 2 3 2   Tired, decreased energy 1 3 0  Change in appetite 1 2 0  Feeling bad or failure about yourself  1 2 0  Trouble concentrating 1 3 0  Moving slowly or fidgety/restless 0 2 0  Suicidal thoughts 0 0  0  PHQ-9 Score 8 19 2   Difficult doing work/chores Not difficult at all Very difficult Not difficult at all    BP Readings from Last 3 Encounters:  09/21/23 122/76  09/19/22 122/78  01/20/22 124/70    Physical Exam Vitals and nursing note reviewed.  Constitutional:      General: She is not in acute distress.    Appearance: She is well-developed.  HENT:     Head: Normocephalic and atraumatic.     Right Ear: Tympanic membrane and ear canal normal.     Left Ear: Tympanic membrane and ear canal normal.     Nose:     Right Sinus: No maxillary sinus tenderness.     Left Sinus: No maxillary sinus tenderness.   Eyes:     General: No scleral icterus.       Right eye: No discharge.        Left eye: No discharge.     Conjunctiva/sclera: Conjunctivae normal.   Neck:     Thyroid : No thyromegaly.     Vascular: No carotid bruit.   Cardiovascular:     Rate and Rhythm: Normal rate and regular rhythm.     Pulses: Normal pulses.     Heart sounds: Normal heart sounds.  Pulmonary:     Effort: Pulmonary effort is normal. No respiratory distress.     Breath sounds: No wheezing.  Abdominal:     General: Bowel sounds are normal.     Palpations: Abdomen is soft.     Tenderness: There is no abdominal tenderness.  Genitourinary:    Labia:        Right: No tenderness, lesion or injury.        Left: No tenderness, lesion or injury.      Vagina:  Normal.     Cervix: Normal.     Uterus: Normal.      Adnexa: Right adnexa normal and left adnexa normal.     Comments: Pap obtained  Musculoskeletal:     Cervical back: Tenderness present. No erythema. Decreased range of motion.     Right lower leg: No edema.     Left lower leg: No edema.  Lymphadenopathy:     Cervical: No cervical adenopathy.   Skin:    General: Skin is warm and dry.     Capillary Refill: Capillary refill takes less than 2 seconds.     Findings: No lesion or rash.   Neurological:     General: No focal deficit present.     Mental Status: She is alert and oriented to person, place, and time.     Cranial Nerves: No cranial nerve deficit.     Sensory: Sensory deficit (tingling in hands) present.     Deep Tendon Reflexes: Reflexes are normal and symmetric.   Psychiatric:        Attention and Perception: Attention normal.        Mood and Affect: Mood normal.     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 Headaches respond well to current therapy with Maxalt . Will continue regimen;  follow up if worsening.       Relevant Medications   rizatriptan  (MAXALT -MLT) 10 MG disintegrating tablet   Hyperlipidemia, mixed (Chronic)   Managed with diet and exercise. Lab Results  Component Value Date   LDLCALC 161 (H) 09/19/2022         Relevant Orders   Lipid panel   DDD (degenerative disc disease), cervical   She is having hiccups with foods consumption as well as trouble initiating swallowing. She was seen by Neurosurgery in  [place] - MRI done several months ago. Repeat surgery is likely needed.  I recommend she discuss the swallowing and hiccups and recommendation as these symptoms are likely due to nerve compression at the level of the C-spine.      Dietary B12 deficiency   Continue monthly B12 injections      Relevant Medications   cyanocobalamin  (VITAMIN B12) 1000 MCG/ML injection   Other Visit Diagnoses       Annual physical exam    -  Primary   Relevant Orders   CBC with Differential/Platelet   Comprehensive metabolic panel with GFR   Hemoglobin A1c   Lipid panel   TSH + free T4     Encounter for screening mammogram for breast cancer       Relevant Orders   MM 3D SCREENING MAMMOGRAM BILATERAL BREAST     Encounter for screening for cervical cancer       Relevant Orders   Cytology - PAP     Colon cancer screening       patient defers until age 69       No follow-ups on file.    Sheron Dixons, MD [facility] [place]

## 2023-09-21 NOTE — Assessment & Plan Note (Signed)
 Managed with diet and exercise. Lab Results  Component Value Date   LDLCALC 161 (H) 09/19/2022

## 2023-09-21 NOTE — Patient Instructions (Signed)
 Call [facility] to schedule your mammogram at [phone].

## 2023-09-21 NOTE — Assessment & Plan Note (Signed)
 She is having hiccups with foods consumption as well as trouble initiating swallowing. She was seen by Neurosurgery in [place] - MRI done several months ago. Repeat surgery is likely needed.  I recommend she discuss the swallowing and hiccups and recommendation as these symptoms are likely due to nerve compression at the level of the C-spine.

## 2023-09-21 NOTE — Assessment & Plan Note (Signed)
Continue monthly B12 injections

## 2023-09-21 NOTE — Assessment & Plan Note (Signed)
 No recent change in migraine headaches. Headaches respond well to current therapy with Maxalt . Will continue regimen;  follow up if worsening.

## 2023-09-22 LAB — LIPID PANEL
Chol/HDL Ratio: 5.6 ratio — ABNORMAL HIGH (ref 0.0–4.4)
Cholesterol, Total: 212 mg/dL — ABNORMAL HIGH (ref 100–199)
HDL: 38 mg/dL — ABNORMAL LOW (ref >39)
LDL Chol Calc (NIH): 153 mg/dL — ABNORMAL HIGH (ref 0–99)
Triglycerides: 116 mg/dL (ref 0–149)
VLDL Cholesterol Cal: 21 mg/dL (ref 5–40)

## 2023-09-22 LAB — COMPREHENSIVE METABOLIC PANEL WITH GFR
ALT: 7 IU/L (ref 0–32)
AST: 12 IU/L (ref 0–40)
Albumin: 4.5 g/dL (ref 3.9–4.9)
Alkaline Phosphatase: 89 IU/L (ref 44–121)
BUN/Creatinine Ratio: 11 (ref 9–23)
BUN: 10 mg/dL (ref 6–24)
Bilirubin Total: 0.3 mg/dL (ref 0.0–1.2)
CO2: 23 mmol/L (ref 20–29)
Calcium: 9.4 mg/dL (ref 8.7–10.2)
Chloride: 101 mmol/L (ref 96–106)
Creatinine, Ser: 0.95 mg/dL (ref 0.57–1.00)
Globulin, Total: 2.8 g/dL (ref 1.5–4.5)
Glucose: 99 mg/dL (ref 70–99)
Potassium: 4.3 mmol/L (ref 3.5–5.2)
Sodium: 141 mmol/L (ref 134–144)
Total Protein: 7.3 g/dL (ref 6.0–8.5)
eGFR: 74 mL/min/{1.73_m2} (ref >59)

## 2023-09-22 LAB — CBC WITH DIFFERENTIAL/PLATELET
Basophils Absolute: 0 10*3/uL (ref 0.0–0.2)
Basos: 0 %
EOS (ABSOLUTE): 0.2 10*3/uL (ref 0.0–0.4)
Eos: 3 %
Hematocrit: 44.9 % (ref 34.0–46.6)
Hemoglobin: 15.1 g/dL (ref 11.1–15.9)
Immature Grans (Abs): 0 10*3/uL (ref 0.0–0.1)
Immature Granulocytes: 0 %
Lymphocytes Absolute: 2.2 10*3/uL (ref 0.7–3.1)
Lymphs: 28 %
MCH: 33.6 pg — ABNORMAL HIGH (ref 26.6–33.0)
MCHC: 33.6 g/dL (ref 31.5–35.7)
MCV: 100 fL — ABNORMAL HIGH (ref 79–97)
Monocytes Absolute: 0.7 10*3/uL (ref 0.1–0.9)
Monocytes: 9 %
Neutrophils Absolute: 4.6 10*3/uL (ref 1.4–7.0)
Neutrophils: 60 %
Platelets: 298 10*3/uL (ref 150–450)
RBC: 4.49 x10E6/uL (ref 3.77–5.28)
RDW: 12 % (ref 11.7–15.4)
WBC: 7.8 10*3/uL (ref 3.4–10.8)

## 2023-09-22 LAB — TSH+FREE T4
Free T4: 1.63 ng/dL (ref 0.82–1.77)
TSH: 4.63 u[IU]/mL — ABNORMAL HIGH (ref 0.450–4.500)

## 2023-09-22 LAB — HEMOGLOBIN A1C
Est. average glucose Bld gHb Est-mCnc: 105 mg/dL
Hgb A1c MFr Bld: 5.3 % (ref 4.8–5.6)

## 2023-09-24 ENCOUNTER — Ambulatory Visit: Payer: Self-pay | Admitting: Internal Medicine

## 2023-09-26 ENCOUNTER — Encounter: Payer: Self-pay | Admitting: Internal Medicine

## 2023-09-26 LAB — CYTOLOGY - PAP
Adequacy: ABSENT
Comment: NEGATIVE
Diagnosis: NEGATIVE
High risk HPV: NEGATIVE

## 2023-09-28 ENCOUNTER — Ambulatory Visit
Admission: EM | Admit: 2023-09-28 | Discharge: 2023-09-28 | Disposition: A | Discharge status: Home / Self Care | Attending: Physician Assistant | Admitting: Physician Assistant

## 2023-09-28 ENCOUNTER — Encounter: Payer: Self-pay | Admitting: Emergency Medicine

## 2023-09-28 ENCOUNTER — Ambulatory Visit

## 2023-09-28 DIAGNOSIS — M7989 Other specified soft tissue disorders: Secondary | ICD-10-CM

## 2023-09-28 DIAGNOSIS — Z23 Encounter for immunization: Secondary | ICD-10-CM | POA: Diagnosis not present

## 2023-09-28 DIAGNOSIS — W540XXA Bitten by dog, initial encounter: Secondary | ICD-10-CM

## 2023-09-28 DIAGNOSIS — L03114 Cellulitis of left upper limb: Secondary | ICD-10-CM | POA: Diagnosis not present

## 2023-09-28 DIAGNOSIS — S61452A Open bite of left hand, initial encounter: Secondary | ICD-10-CM

## 2023-09-28 DIAGNOSIS — M79642 Pain in left hand: Secondary | ICD-10-CM | POA: Diagnosis not present

## 2023-09-28 MED ORDER — AMOXICILLIN-POT CLAVULANATE 875-125 MG PO TABS: 1.0000 | ORAL_TABLET | Freq: Two times a day (BID) | ORAL | 0 refills | Status: AC

## 2023-09-28 MED ORDER — TETANUS-DIPHTH-ACELL PERTUSSIS 5-2.5-18.5 LF-MCG/0.5 IM SUSY
0.5000 mL | PREFILLED_SYRINGE | Freq: Once | INTRAMUSCULAR | Status: AC
  Administered 2023-09-28: 0.5 mL via INTRAMUSCULAR

## 2023-09-28 NOTE — Discharge Instructions (Addendum)
-   I did not see any fractures on your x-ray. - I am concerned about you having an infection in your hand related to the dog bite. - I sent antibiotics to the pharmacy so you should be having improvement in your symptoms within a couple days. - Continue home pain medication and ice hand every couple of hours while awake. - If fever, worsening pain, increased swelling, no improvement in condition in a couple days please go to the ER. - If the radiologist sees something abnormal on the x-ray I will contact you.

## 2023-09-28 NOTE — ED Provider Notes (Signed)
 [facility]    CSN: [id number] Arrival date & time: 09/28/23  1710      History   Chief Complaint Chief Complaint  Patient presents with   Animal Bite    Left hand    HPI Theresa Murphy is a 48 y.o. female presenting for evaluation of dog bite of the left hand that occurred last night.  She states she touched her dog's foot and the dog bit her.  She states she adopted the dog from a Psychologist, educational about 5 months ago and she thinks that has been traumatized.  This is the first time it has bit her.  She has cleaned the wounds.  She states it swelled up immediately but the swelling got worse and started the pain overnight.  Reports increased pain when trying to move her wrist or make a fist.  She is is unsure of her last tetanus immunization so that will be updated today.  Her animal is up-to-date with all of its vaccinations and she states she has the information at home.  No other injuries.  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   Migraine has had since cervical fusion 2015   Sterilization consult 11/15/2014    Patient Active Problem List   Diagnosis Date Noted   Dyspareunia due to medical condition in female 05/15/2018   Hyperlipidemia, mixed 03/23/2018   History of arthrodesis 08/29/2017   Pap smear abnormality of cervix with ASCUS favoring benign 08/14/2017   Drug-induced constipation 07/20/2017   Dietary B12 deficiency 06/09/2016   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   DDD (degenerative disc disease), cervical 02/09/2015   Cervical fusion syndrome 02/09/2015   Cervical facet syndrome 02/09/2015   Bilateral occipital neuralgia 02/09/2015   DDD (degenerative disc disease), 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/2015   DDD (degenerative disc disease), lumbar 02/09/2015   Generalized anxiety  disorder 01/08/2015   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   Acquired hypothyroidism 11/15/2014   Irritable bowel syndrome with constipation 11/15/2014   OP (osteoporosis) 11/15/2014    Past Surgical History:  Procedure Laterality Date   APPENDECTOMY     CERVICAL FUSION  2015   [facility]   ECTOPIC PREGNANCY SURGERY     x 2   LAPAROSCOPIC BILATERAL SALPINGECTOMY Bilateral 05/30/2018   Procedure: LAPAROSCOPIC BILATERAL SALPINGECTOMY;  Surgeon: Alben Alma, MD;  Location: [facility] OR[facility];  Service: Gynecology;  Laterality: Bilateral;   SHOULDER ARTHROSCOPY Right 2014   TONSILLECTOMY      OB History     Gravida  3   Para  1   Term  1   Preterm      AB  2   Living  1      SAB      IAB      Ectopic  2   Multiple      Live Births               Home Medications    Prior to Admission medications   Medication Sig Start Date End Date Taking? Authorizing Provider  amoxicillin -clavulanate (AUGMENTIN ) 875-125 MG tablet Take 1 tablet by mouth every 12 (twelve) hours for 7 days. 09/28/23 10/05/23 Yes Floydene Hy, PA-C  Oxycodone  HCl 20 MG TABS Take 20 mg by mouth every  6 (six) hours as needed (pain).   Yes [provider]  cyanocobalamin  (VITAMIN B12) 1000 MCG/ML injection Inject 1 mL (1,000 mcg total) into the muscle every 30 (thirty) days. 09/21/23   Sheron Dixons, MD  LORazepam  (ATIVAN ) 0.5 MG tablet Take 1 tablet (0.5 mg total) by mouth 2 (two) times daily as needed for anxiety. 09/19/22   Sheron Dixons, MD  rizatriptan  (MAXALT -MLT) 10 MG disintegrating tablet Take 1 tablet (10 mg total) by mouth as needed for migraine. May repeat in 2 hours if needed 09/21/23   Sheron Dixons, MD    Family History Family History  Problem Relation Age of Onset   Thyroid  disease Mother    Diabetes Father    CAD Father    Arthritis Father    Alcohol abuse Maternal Grandmother    Breast cancer Neg Hx     Social History Social History    Tobacco Use   Smoking status: Every Day    Current packs/day: 0.00    Average packs/day: 1 pack/day for 2.0 years (2.0 ttl pk-yrs)    Types: Cigarettes    Start date: 04/10/2002    Last attempt to quit: 04/10/2004    Years since quitting: 19.4   Smokeless tobacco: Never  Vaping Use   Vaping status: Never Used  Substance Use Topics   Alcohol use: Yes    Alcohol/week: 2.0 standard drinks of alcohol    Types: 2 Standard drinks or equivalent per week    Comment: ocassional   Drug use: Yes    Comment: prescribed percocet for chronic pain     Allergies   Cymbalta  [duloxetine  hcl]   Review of Systems Review of Systems  Musculoskeletal:  Positive for arthralgias and joint swelling.  Skin:  Positive for color change and wound.  Neurological:  Negative for weakness and numbness.     Physical Exam Triage Vital Signs ED Triage Vitals  Encounter Vitals Group     BP 09/28/23 1735 123/77     Girls Systolic BP Percentile --      Girls Diastolic BP Percentile --      Boys Systolic BP Percentile --      Boys Diastolic BP Percentile --      Pulse Rate 09/28/23 1735 88     Resp 09/28/23 1735 14     Temp 09/28/23 1735 98.7 F (37.1 C)     Temp Source 09/28/23 1735 Oral     SpO2 09/28/23 1735 96 %     Weight 09/28/23 1734 165 lb (74.8 kg)     Height 09/28/23 1734 5' 4 (1.626 m)     Head Circumference --      Peak Flow --      Pain Score 09/28/23 1734 7     Pain Loc --      Pain Education --      Exclude from Growth Chart --    No data found.  Updated Vital Signs BP 123/77 (BP Location: Right Arm)   Pulse 88   Temp 98.7 F (37.1 C) (Oral)   Resp 14   Ht 5' 4 (1.626 m)   Wt 165 lb (74.8 kg)   SpO2 96%   BMI 28.32 kg/m      Physical Exam Vitals and nursing note reviewed.  Constitutional:      General: She is not in acute distress.    Appearance: Normal appearance. She is not ill-appearing or toxic-appearing.  HENT:     Head:  Normocephalic and atraumatic.    Eyes:     General: No scleral icterus.       Right eye: No discharge.        Left eye: No discharge.     Conjunctiva/sclera: Conjunctivae normal.    Cardiovascular:     Pulses: Normal pulses.  Pulmonary:     Effort: Pulmonary effort is normal. No respiratory distress.   Musculoskeletal:     Cervical back: Neck supple.     Comments: LEFT HAND: See images included in chart. There is moderate swelling of the dorsal hand and wrist. Multiple puncture wounds on hand, wrist, and fingers. Erythema surrounding puncture wounds of dorsal hand. Good pulses. Hand is diffusely tender.   Skin:    General: Skin is dry.   Neurological:     General: No focal deficit present.     Mental Status: She is alert. Mental status is at baseline.     Motor: No weakness.     Gait: Gait normal.   Psychiatric:        Mood and Affect: Mood normal.        Behavior: Behavior normal.         UC Treatments / Results  Labs (all labs ordered are listed, but only abnormal results are displayed) Labs Reviewed - No data to display  EKG   Radiology No results found.  Procedures Procedures (including critical care time)  Medications Ordered in UC Medications  Tdap (BOOSTRIX) injection 0.5 mL (0.5 mLs Intramuscular Given 09/28/23 1737)    Initial Impression / Assessment and Plan / UC Course  I have reviewed the triage vital signs and the nursing notes.  Pertinent labs & imaging results that were available during my care of the patient were reviewed by me and considered in my medical decision making (see chart for details).   48 year old female presents for left hand pain, swelling and puncture wounds after her own dog bit her last night.  The animal is up-to-date with his vaccines.  Patient is not up-to-date with her tetanus immunization so that was updated today.  Bite report done by nursing staff.  See images included in chart of patient's hand.  An x-ray obtained to rule out underlying  fracture.  Wet read negative.  Will contact patient if x-ray abnormal.  Treating at this time for cellulitis related to dog bite.  Sent Augmentin  to pharmacy.  Patient has oxycodone  at home for pain relief.  Advised to continue that as needed for pain, ice and elevate hand.  Thoroughly reviewed return and ER precautions relating to dog bites and infection.  X-ray overread negative.    Final Clinical Impressions(s) / UC Diagnoses   Final diagnoses:  Dog bite of left hand, initial encounter  Pain of left hand  Swelling of left hand  Cellulitis of left hand     Discharge Instructions      - I did not see any fractures on your x-ray. - I am concerned about you having an infection in your hand related to the dog bite. - I sent antibiotics to the pharmacy so you should be having improvement in your symptoms within a couple days. - Continue home pain medication and ice hand every couple of hours while awake. - If fever, worsening pain, increased swelling, no improvement in condition in a couple days please go to the ER. - If the radiologist sees something abnormal on the x-ray I will contact you.     ED Prescriptions  Medication Sig Dispense Auth. Provider   amoxicillin -clavulanate (AUGMENTIN ) 875-125 MG tablet Take 1 tablet by mouth every 12 (twelve) hours for 7 days. 14 tablet Floydene Hy, PA-C      I have reviewed the PDMP during this encounter.   Floydene Hy, PA-C 09/28/23 2010

## 2023-09-28 NOTE — ED Triage Notes (Signed)
 Patient states that her dog bite her left hand last night when she touched the dog's foot.  Patient states that her do is UTD on it's vaccine.  Patient reports pain, swelling and redness in her left hand.

## 2023-10-01 ENCOUNTER — Ambulatory Visit (HOSPITAL_COMMUNITY): Payer: Self-pay
